# Patient Record
Sex: Female | Born: 1950 | Race: White | Hispanic: No | Smoking: Never smoker
Health system: Southern US, Community
[De-identification: ages and names within clinical notes are randomized; demographics above are authoritative.]

## PROBLEM LIST (undated history)

## (undated) DIAGNOSIS — Z87442 Personal history of urinary calculi: Secondary | ICD-10-CM

## (undated) DIAGNOSIS — K449 Diaphragmatic hernia without obstruction or gangrene: Secondary | ICD-10-CM

## (undated) DIAGNOSIS — F419 Anxiety disorder, unspecified: Secondary | ICD-10-CM

## (undated) DIAGNOSIS — G4733 Obstructive sleep apnea (adult) (pediatric): Secondary | ICD-10-CM

## (undated) DIAGNOSIS — K219 Gastro-esophageal reflux disease without esophagitis: Secondary | ICD-10-CM

## (undated) DIAGNOSIS — J449 Chronic obstructive pulmonary disease, unspecified: Secondary | ICD-10-CM

## (undated) DIAGNOSIS — M4800 Spinal stenosis, site unspecified: Secondary | ICD-10-CM

## (undated) DIAGNOSIS — M719 Bursopathy, unspecified: Secondary | ICD-10-CM

## (undated) DIAGNOSIS — J189 Pneumonia, unspecified organism: Secondary | ICD-10-CM

## (undated) DIAGNOSIS — I8390 Asymptomatic varicose veins of unspecified lower extremity: Secondary | ICD-10-CM

## (undated) DIAGNOSIS — K589 Irritable bowel syndrome without diarrhea: Secondary | ICD-10-CM

## (undated) DIAGNOSIS — M797 Fibromyalgia: Secondary | ICD-10-CM

## (undated) DIAGNOSIS — T7840XA Allergy, unspecified, initial encounter: Secondary | ICD-10-CM

## (undated) HISTORY — DX: Gastro-esophageal reflux disease without esophagitis: K21.9

## (undated) HISTORY — PX: CHOLECYSTECTOMY: SHX55

## (undated) HISTORY — DX: Obstructive sleep apnea (adult) (pediatric): G47.33

## (undated) HISTORY — DX: Asymptomatic varicose veins of unspecified lower extremity: I83.90

## (undated) HISTORY — DX: Bursopathy, unspecified: M71.9

## (undated) HISTORY — DX: Fibromyalgia: M79.7

## (undated) HISTORY — PX: TUBAL LIGATION: SHX77

## (undated) HISTORY — DX: Allergy, unspecified, initial encounter: T78.40XA

## (undated) HISTORY — DX: Diaphragmatic hernia without obstruction or gangrene: K44.9

## (undated) HISTORY — PX: PARTIAL HYSTERECTOMY: SHX80

## (undated) HISTORY — PX: BACK SURGERY: SHX140

## (undated) HISTORY — PX: OTHER SURGICAL HISTORY: SHX169

## (undated) HISTORY — DX: Irritable bowel syndrome, unspecified: K58.9

## (undated) HISTORY — DX: Morbid (severe) obesity due to excess calories: E66.01

## (undated) HISTORY — DX: Spinal stenosis, site unspecified: M48.00

---

## 1991-08-31 HISTORY — PX: ABDOMINAL HYSTERECTOMY: SHX81

## 1993-08-30 HISTORY — PX: APPENDECTOMY: SHX54

## 1998-08-30 HISTORY — PX: BREAST BIOPSY: SHX20

## 2004-06-02 ENCOUNTER — Ambulatory Visit: Payer: Self-pay | Admitting: General Surgery

## 2004-06-30 ENCOUNTER — Ambulatory Visit: Payer: Self-pay | Admitting: Unknown Physician Specialty

## 2004-07-16 ENCOUNTER — Ambulatory Visit: Payer: Self-pay | Admitting: General Surgery

## 2005-02-24 ENCOUNTER — Encounter: Admission: RE | Admit: 2005-02-24 | Discharge: 2005-02-24 | Payer: Self-pay | Admitting: Neurological Surgery

## 2005-02-26 ENCOUNTER — Ambulatory Visit (HOSPITAL_COMMUNITY): Admission: RE | Admit: 2005-02-26 | Discharge: 2005-02-27 | Payer: Self-pay | Admitting: Neurological Surgery

## 2005-05-14 ENCOUNTER — Ambulatory Visit: Payer: Self-pay | Admitting: Certified Nurse Midwife

## 2005-12-30 ENCOUNTER — Ambulatory Visit (HOSPITAL_COMMUNITY): Admission: RE | Admit: 2005-12-30 | Discharge: 2005-12-30 | Payer: Self-pay | Admitting: *Deleted

## 2006-08-30 HISTORY — PX: COLONOSCOPY: SHX174

## 2006-08-30 HISTORY — PX: UPPER GI ENDOSCOPY: SHX6162

## 2007-03-02 ENCOUNTER — Ambulatory Visit: Payer: Self-pay

## 2007-03-31 ENCOUNTER — Ambulatory Visit: Payer: Self-pay | Admitting: General Surgery

## 2008-04-30 ENCOUNTER — Ambulatory Visit: Payer: Self-pay

## 2008-08-29 ENCOUNTER — Ambulatory Visit: Payer: Self-pay | Admitting: Internal Medicine

## 2008-08-30 HISTORY — PX: OTHER SURGICAL HISTORY: SHX169

## 2009-11-14 ENCOUNTER — Ambulatory Visit: Payer: Self-pay | Admitting: Urology

## 2009-12-05 ENCOUNTER — Inpatient Hospital Stay: Payer: Self-pay | Admitting: Internal Medicine

## 2010-02-18 ENCOUNTER — Ambulatory Visit: Payer: Self-pay | Admitting: Internal Medicine

## 2010-03-17 ENCOUNTER — Encounter: Payer: Self-pay | Admitting: Internal Medicine

## 2010-03-30 ENCOUNTER — Encounter: Payer: Self-pay | Admitting: Internal Medicine

## 2010-05-12 ENCOUNTER — Ambulatory Visit: Payer: Self-pay | Admitting: Cardiology

## 2010-06-16 ENCOUNTER — Ambulatory Visit: Payer: Self-pay

## 2010-10-16 ENCOUNTER — Ambulatory Visit: Payer: Self-pay | Admitting: General Surgery

## 2010-10-19 LAB — PATHOLOGY REPORT

## 2011-01-15 NOTE — Op Note (Signed)
[name], Darlene Hubbard Hubbard                    ACCOUNT NO.:  [id number]   MEDICAL RECORD NO.:  [id number]          PATIENT TYPE:  OIB   LOCATION:  2856                         FACILITY:  [facility]   PHYSICIAN:  Tia Alert, MD     DATE OF BIRTH:  1950-10-30   DATE OF PROCEDURE:  02/26/2005  DATE OF DISCHARGE:                                 OPERATIVE REPORT   PREOPERATIVE DIAGNOSIS:  Lumbar spinal stenosis, L4-5, with left leg pain.   POSTOPERATIVE DIAGNOSIS:  Lumbar spinal stenosis, L4-5, with left leg pain.   PROCEDURES:  1.  Decompressive lumbar hemilaminectomy, medial facetectomy and      foraminotomy, L4-5 on the left.  2.  Sublaminar decompression of L4-5 for right lateral recess decompression.  3.  Microdiskectomy of L4-5 on the left utilizing microscopic dissection.   SURGEON:  Tia Alert, M.D.   ASSISTANT:  None.   ANESTHESIA:  General endotracheal.   COMPLICATIONS:  None apparent.   INDICATIONS FOR THE PROCEDURE:  Darlene Hubbard is a 60 year old white female who  was referred with back and left leg pain.  She had an MRI and then a CT scan  which showed significant spinal stenosis at L4-5 with a midline disk  herniation.  I recommended a lumbar decompressive hemilaminectomy with  sublaminar decompression from the left side because the patient's pain was  in her left leg.  She understood the risks, the benefits and expected  outcome and she wished to proceed with the procedure.   DESCRIPTION OF THE PROCEDURE:  The patient was taken to the operating room  and after induction of general endotracheal anesthesia, she was rolled into  the prone position on the Wilson frame and all pressure points were padded.  Her lumbar region was prepped with DuraPrep and then draped in the usual  sterile fashion.  Then 10 mL of local anesthesia were injected.  Then a  dorsal midline incision was made and carried down to the lumbosacral fascia.  The fascia was opened on the patient's left side and  the paraspinous  musculature was taken down in a subperiosteal fashion to expose the L4-5  interspace on the left.  Intraoperative x-ray confirmed our level.  Then a  hemilaminectomy, medial facetectomy and foraminotomy were performed at L4-5  on the left utilizing a combination of the drill and the Kerrison punches.  The yellow ligament was identified, opened and removed to expose the  underlying dura and L5 nerve root.  A foraminotomy was performed and his  lateral recess was decompressed.  I then used the drill to drill up under  the spinous process at L4 and then used the Kerrison punch to remove the  yellow ligament from the right lateral recess.  I could identify the lateral  edge of the dura on the patient's right side also.  Once the midline  decompression was complete, I retracted the nerve gently medially and  coagulated the upper nerve venous vasculature and then cut into the disk  space after finding a significant subannular bulge.  A thorough intradiskal  diskectomy was then performed with pituitary rongeurs and curets.  Once the  decompression was complete, I used a nerve hook to palpate into the foramen  and into the midline to ensure adequate decompression.  I then lined the  dura with Gelfoam and dried all bleeding points with bipolar cautery. I  closed the fascia with interrupted 1-0 Vicryl, closed the subcutaneous and  subcuticular tissues with 2-0 and 3-0 Vicryl and  closed the skin with Dermabond.  The drapes were removed.  A sterile  dressing was applied.  The patient was awaken from general anesthesia and  transferred to the recovery room in stable condition.  At the end of the  procedure, all sponge, needle and instrument counts were correct.       DSJ/MEDQ  D:  02/26/2005  T:  02/26/2005  Job:  [id number]

## 2011-01-15 NOTE — Cardiovascular Report (Signed)
NAMEThurza, Darlene Hubbard                    ACCOUNT NO.:  [id number]   MEDICAL RECORD NO.:  [id number]          PATIENT TYPE:  OIB   LOCATION:  2899                         FACILITY:  [facility]   PHYSICIAN:  Elmore Guise., M.D.DATE OF BIRTH:  1951/05/13   DATE OF PROCEDURE:  12/30/2005  DATE OF DISCHARGE:                              CARDIAC CATHETERIZATION   INDICATION FOR PROCEDURE:  Progressive fatigue, dyspnea, chest pain, and  abnormal stress test.   DESCRIPTION OF PROCEDURE:  Patient brought to the cardiac catheterization  laboratory after appropriate informed consent.  She was prepped and draped  in the sterile fashion.  Approximately 20 mL of 1% lidocaine was used for  local anesthesia.  A 6-French sheath was placed in the right femoral artery  without difficulty.  Coronary angiography, LV angiography, limited right  femoral angiography were then performed.  AngioSeal closure device was  deployed without complication.   FINDINGS:  1.  Left main:  Normal.  2.  LAD:  Normal-appearing moderate to large vessel number 3.  D1:  Moderate      sized vessel, normal-appearing.  3.  LCX:  Non-dominant, normal-appearing.  4.  OM1:  Normal-appearing, moderate sized vessel.  5.  RCA:  Dominant, normal-appearing large vessel with PDA and PLV extending      near the apex.  6.  LV:  EF is 65%.  No wall motion abnormalities.  LVEDP is 18 mmHg.  7.  Limited right femoral angiogram shows no significant atherosclerotic      disease and appropriate arteriotomy site for AngioSeal closure device      deployment.  AngioSeal closure device was deployed without difficulty.   IMPRESSION:  1.  Normal-appearing coronary arteries.  2.  Preserved left ventricular systolic function with an ejection fraction      of 65%.   PLAN:  Aggressive risk factor modification as indicated.      Elmore Guise., M.D.  Electronically Signed     TWK/MEDQ  D:  12/30/2005  T:  12/31/2005  Job:  [id number]   cc:    Wonda Cheng  Fax: [phone]

## 2011-06-03 ENCOUNTER — Inpatient Hospital Stay: Payer: Self-pay

## 2012-03-16 ENCOUNTER — Ambulatory Visit: Payer: Self-pay

## 2013-03-15 ENCOUNTER — Encounter: Payer: Self-pay | Admitting: *Deleted

## 2013-04-12 ENCOUNTER — Ambulatory Visit: Payer: Self-pay | Admitting: Internal Medicine

## 2013-04-26 ENCOUNTER — Emergency Department: Payer: Self-pay | Admitting: Emergency Medicine

## 2013-04-26 LAB — COMPREHENSIVE METABOLIC PANEL
Albumin: 3.1 g/dL — ABNORMAL LOW (ref 3.4–5.0)
Alkaline Phosphatase: 90 U/L (ref 50–136)
Anion Gap: 5 — ABNORMAL LOW (ref 7–16)
BUN: 18 mg/dL (ref 7–18)
Bilirubin,Total: 0.5 mg/dL (ref 0.2–1.0)
Calcium, Total: 8.8 mg/dL (ref 8.5–10.1)
Chloride: 104 mmol/L (ref 98–107)
Co2: 30 mmol/L (ref 21–32)
Creatinine: 0.85 mg/dL (ref 0.60–1.30)
EGFR (African American): >60
EGFR (Non-African Amer.): >60
Glucose: 97 mg/dL (ref 65–99)
Osmolality: 279 (ref 275–301)
Potassium: 3.4 mmol/L — ABNORMAL LOW (ref 3.5–5.1)
SGOT(AST): 19 U/L (ref 15–37)
SGPT (ALT): 23 U/L (ref 12–78)
Sodium: 139 mmol/L (ref 136–145)
Total Protein: 6.3 g/dL — ABNORMAL LOW (ref 6.4–8.2)

## 2013-04-26 LAB — CBC
HCT: 38.3 % (ref 35.0–47.0)
HGB: 13.1 g/dL (ref 12.0–16.0)
MCH: 29.4 pg (ref 26.0–34.0)
MCHC: 34.1 g/dL (ref 32.0–36.0)
MCV: 86 fL (ref 80–100)
Platelet: 241 10*3/uL (ref 150–440)
RBC: 4.45 10*6/uL (ref 3.80–5.20)
RDW: 14.4 % (ref 11.5–14.5)
WBC: 7.2 10*3/uL (ref 3.6–11.0)

## 2013-04-26 LAB — URINALYSIS, COMPLETE
Bacteria: NONE SEEN
Bilirubin,UR: NEGATIVE
Blood: NEGATIVE
Glucose,UR: NEGATIVE mg/dL (ref 0–75)
Ketone: NEGATIVE
Leukocyte Esterase: NEGATIVE
Nitrite: NEGATIVE
Ph: 6 (ref 4.5–8.0)
Protein: NEGATIVE
RBC,UR: <1 /HPF (ref 0–5)
Specific Gravity: 1.005 (ref 1.003–1.030)
Squamous Epithelial: <1
WBC UR: <1 /HPF (ref 0–5)

## 2013-04-27 ENCOUNTER — Ambulatory Visit: Payer: Self-pay | Admitting: Internal Medicine

## 2013-12-22 DIAGNOSIS — K219 Gastro-esophageal reflux disease without esophagitis: Secondary | ICD-10-CM | POA: Insufficient documentation

## 2014-05-16 ENCOUNTER — Ambulatory Visit: Payer: Self-pay

## 2014-07-01 ENCOUNTER — Encounter: Payer: Self-pay | Admitting: *Deleted

## 2014-09-04 ENCOUNTER — Emergency Department: Payer: Self-pay | Admitting: Student

## 2014-11-20 ENCOUNTER — Ambulatory Visit: Payer: Self-pay | Admitting: Internal Medicine

## 2015-05-07 ENCOUNTER — Other Ambulatory Visit: Payer: Self-pay | Admitting: Internal Medicine

## 2015-05-07 DIAGNOSIS — Z1231 Encounter for screening mammogram for malignant neoplasm of breast: Secondary | ICD-10-CM

## 2015-05-19 ENCOUNTER — Ambulatory Visit: Payer: Self-pay

## 2015-05-21 ENCOUNTER — Ambulatory Visit
Admission: RE | Admit: 2015-05-21 | Discharge: 2015-05-21 | Disposition: A | Discharge status: Home / Self Care | Payer: BC Managed Care – PPO | Source: Ambulatory Visit | Attending: Internal Medicine | Admitting: Internal Medicine

## 2015-05-21 DIAGNOSIS — Z1231 Encounter for screening mammogram for malignant neoplasm of breast: Secondary | ICD-10-CM | POA: Diagnosis present

## 2015-08-06 DIAGNOSIS — L570 Actinic keratosis: Secondary | ICD-10-CM

## 2015-08-06 HISTORY — DX: Actinic keratosis: L57.0

## 2015-08-13 ENCOUNTER — Encounter: Payer: Self-pay | Admitting: *Deleted

## 2015-08-28 DIAGNOSIS — C4491 Basal cell carcinoma of skin, unspecified: Secondary | ICD-10-CM

## 2015-08-28 HISTORY — DX: Basal cell carcinoma of skin, unspecified: C44.91

## 2015-10-20 ENCOUNTER — Ambulatory Visit: Payer: Self-pay | Admitting: General Surgery

## 2015-11-26 DIAGNOSIS — J4540 Moderate persistent asthma, uncomplicated: Secondary | ICD-10-CM | POA: Insufficient documentation

## 2015-12-10 ENCOUNTER — Encounter: Payer: Self-pay | Admitting: General Surgery

## 2015-12-11 ENCOUNTER — Ambulatory Visit: Payer: BC Managed Care – PPO | Admitting: General Surgery

## 2015-12-11 ENCOUNTER — Encounter: Payer: Self-pay | Admitting: General Surgery

## 2015-12-11 ENCOUNTER — Ambulatory Visit (INDEPENDENT_AMBULATORY_CARE_PROVIDER_SITE_OTHER): Payer: BC Managed Care – PPO | Admitting: General Surgery

## 2015-12-11 VITALS — BP 110/62 | HR 78 | Resp 12 | Ht 63.0 in | Wt 179.0 lb

## 2015-12-11 DIAGNOSIS — Z800 Family history of malignant neoplasm of digestive organs: Secondary | ICD-10-CM

## 2015-12-11 MED ORDER — POLYETHYLENE GLYCOL 3350 17 GM/SCOOP PO POWD: ORAL | Status: DC

## 2015-12-11 NOTE — Patient Instructions (Addendum)
Colonoscopy A colonoscopy is an exam to look at the entire large intestine (colon). This exam can help find problems such as tumors, polyps, inflammation, and areas of bleeding. The exam takes about 1 hour.  LET [facility] CARE PROVIDER KNOW ABOUT:   Any allergies you have.  All medicines you are taking, including vitamins, herbs, eye drops, creams, and over-the-counter medicines.  Previous problems you or members of your family have had with the use of anesthetics.  Any blood disorders you have.  Previous surgeries you have had.  Medical conditions you have. RISKS AND COMPLICATIONS  Generally, this is a safe procedure. However, as with any procedure, complications can occur. Possible complications include:  Bleeding.  Tearing or rupture of the colon wall.  Reaction to medicines given during the exam.  Infection (rare). BEFORE THE PROCEDURE   Ask your health care provider about changing or stopping your regular medicines.  You may be prescribed an oral bowel prep. This involves drinking a large amount of medicated liquid, starting the day before your procedure. The liquid will cause you to have multiple loose stools until your stool is almost clear or light green. This cleans out your colon in preparation for the procedure.  Do not eat or drink anything else once you have started the bowel prep, unless your health care provider tells you it is safe to do so.  Arrange for someone to drive you home after the procedure. PROCEDURE   You will be given medicine to help you relax (sedative).  You will lie on your side with your knees bent.  A long, flexible tube with a light and camera on the end (colonoscope) will be inserted through the rectum and into the colon. The camera sends video back to a computer screen as it moves through the colon. The colonoscope also releases carbon dioxide gas to inflate the colon. This helps your health care provider see the area better.  During  the exam, your health care provider may take a small tissue sample (biopsy) to be examined under a microscope if any abnormalities are found.  The exam is finished when the entire colon has been viewed. AFTER THE PROCEDURE   Do not drive for 24 hours after the exam.  You may have a small amount of blood in your stool.  You may pass moderate amounts of gas and have mild abdominal cramping or bloating. This is caused by the gas used to inflate your colon during the exam.  Ask when your test results will be ready and how you will get your results. Make sure you get your test results.   This information is not intended to replace advice given to you by your health care provider. Make sure you discuss any questions you have with your health care provider.   Document Released: 08/13/2000 Document Revised: 06/06/2013 Document Reviewed: 04/23/2013 Elsevier Interactive Patient Education [employer].  Patient has been scheduled for a colonoscopy on 02-18-16 at [facility].

## 2015-12-11 NOTE — Progress Notes (Signed)
Patient ID: Darlene Hubbard, female   DOB: 1951/05/15, 65 y.o.   MRN: [id number]  Chief Complaint  Patient presents with  . Colonoscopy    HPI Darlene Hubbard is a 65 y.o. female here today for a evaluation of a colonoscopy. Last colonoscopy 10/16/10. Patient states no GI problems at this time. Father had colon cancer.  I personally reviewed the patient's history. HPI  Past Medical History  Diagnosis Date  . Asthma   . Fibromyalgia   . Hiatal hernia   . GERD (gastroesophageal reflux disease)   . Varicose veins   . Mild obstructive sleep apnea   . Spinal stenosis   . Morbid obesity ([place])   . IBS (irritable bowel syndrome)   . HTN (hypertension)   . Allergy   . Bursitis     BOTH SHOULDER AND HIPS    Past Surgical History  Procedure Laterality Date  . Back surgery    . Cholecystectomy    . Tubal ligation    . Partial hysterectomy    . Appendectomy  1995  . Colonoscopy  2008  . Upper gi endoscopy  2008  . Abdominal hysterectomy  1993  . Vein closure procedure Bilateral 2010  . Breast biopsy Left 2000    neg    Family History  Problem Relation Age of Onset  . Colon cancer Father 12  . Thrombocytopenia Mother 6  . Other Sister     Bone Marrow disorder    Social History Social History  Substance Use Topics  . Smoking status: Never Smoker   . Smokeless tobacco: Never Used  . Alcohol Use: No    Allergies  Allergen Reactions  . Albuterol     Respiratory Distress  . Molds & Smuts   . Penicillins   . Diovan [Valsartan] Rash    Edema  . Sulfa Drugs Cross Reactors Itching and Rash    Current Outpatient Prescriptions  Medication Sig Dispense Refill  . ALPRAZolam (XANAX) 0.5 MG tablet     . cetirizine (ZYRTEC) 10 MG tablet Take by mouth.    . Fluticasone-Salmeterol (ADVAIR) 100-50 MCG/DOSE AEPB Inhale 1 puff into the lungs 2 (two) times daily.    Marland Kitchen gabapentin (NEURONTIN) 300 MG capsule Take 300 mg by mouth 2 (two) times daily.     . Multiple Vitamin  (MULTI-VITAMINS) TABS Take by mouth.    . sertraline (ZOLOFT) 50 MG tablet Take by mouth.    . polyethylene glycol powder (GLYCOLAX/MIRALAX) powder 255 grams one bottle for colonoscopy prep 255 g 0   No current facility-administered medications for this visit.    Review of Systems Review of Systems  Constitutional: Negative.   Respiratory: Negative.   Cardiovascular: Negative.     Blood pressure 110/62, pulse 78, resp. rate 12, height 5\' 3"  (1.6 m), weight 179 lb (81.194 kg).  Physical Exam Physical Exam  Constitutional: She is oriented to person, place, and time. She appears well-developed and well-nourished.  Eyes: Conjunctivae are normal. No scleral icterus.  Neck: Neck supple.  Cardiovascular: Normal rate, regular rhythm and normal heart sounds.   Pulmonary/Chest: Effort normal and breath sounds normal.  Abdominal: Soft. Normal appearance and bowel sounds are normal. There is no hepatomegaly. There is no tenderness.     Lymphadenopathy:    She has no cervical adenopathy.  Neurological: She is alert and oriented to person, place, and time.  Skin: Skin is warm and dry.    Data Reviewed Colonoscopy dated 10/16/2010 was unremarkable. Five-year  follow-up indicated based on family history.  Assessment    Candidate for follow-up colonoscopy.    Plan       Colonoscopy with possible biopsy/polypectomy prn: Information regarding the procedure, including its potential risks and complications (including but not limited to perforation of the bowel, which may require emergency surgery to repair, and bleeding) was verbally given to the patient. Educational information regarding lower intestinal endoscopy was given to the patient. Written instructions for how to complete the bowel prep using Miralax were provided. The importance of drinking ample fluids to avoid dehydration as a result of the prep emphasized.  Patient has been scheduled for a colonoscopy on 02-18-16 at [facility].  PCP:   Emily Filbert F  This information has been scribed by Darlene Hubbard CMA.    Darlene Hubbard 12/12/2015, 6:30 PM

## 2015-12-12 DIAGNOSIS — Z800 Family history of malignant neoplasm of digestive organs: Secondary | ICD-10-CM | POA: Insufficient documentation

## 2016-01-18 ENCOUNTER — Emergency Department
Admission: EM | Admit: 2016-01-18 | Discharge: 2016-01-18 | Disposition: A | Discharge status: Home / Self Care | Payer: BC Managed Care – PPO | Attending: Emergency Medicine | Admitting: Emergency Medicine

## 2016-01-18 ENCOUNTER — Encounter: Payer: Self-pay | Admitting: Emergency Medicine

## 2016-01-18 DIAGNOSIS — Y9389 Activity, other specified: Secondary | ICD-10-CM | POA: Insufficient documentation

## 2016-01-18 DIAGNOSIS — I10 Essential (primary) hypertension: Secondary | ICD-10-CM | POA: Insufficient documentation

## 2016-01-18 DIAGNOSIS — J45909 Unspecified asthma, uncomplicated: Secondary | ICD-10-CM | POA: Insufficient documentation

## 2016-01-18 DIAGNOSIS — S81812A Laceration without foreign body, left lower leg, initial encounter: Secondary | ICD-10-CM | POA: Diagnosis not present

## 2016-01-18 DIAGNOSIS — Y929 Unspecified place or not applicable: Secondary | ICD-10-CM | POA: Insufficient documentation

## 2016-01-18 DIAGNOSIS — Y999 Unspecified external cause status: Secondary | ICD-10-CM | POA: Insufficient documentation

## 2016-01-18 DIAGNOSIS — W1849XA Other slipping, tripping and stumbling without falling, initial encounter: Secondary | ICD-10-CM | POA: Insufficient documentation

## 2016-01-18 MED ORDER — LIDOCAINE HCL (PF) 1 % IJ SOLN
5.0000 mL | Freq: Once | INTRAMUSCULAR | Status: AC
  Administered 2016-01-18: 5 mL
  Filled 2016-01-18: qty 5

## 2016-01-18 NOTE — ED Provider Notes (Signed)
[facility] Emergency Department Provider Note ____________________________________________  Time seen: 1205  I have reviewed the triage vital signs and the nursing notes.  HISTORY  Chief Complaint  Laceration  HPI Darlene Hubbard is a 65 y.o. female presents to the ED for evaluation of a large skin tear laceration to the anterior left shin. She describes accident occurred as she stepped over herdog gait and slipped. She had a similar skin tear to the other extremity about a year prior. She has a dampened gauze in place upon arrival. She denies any other injury at this time. She reports minimal pain at a 2/10 in triage.  Past Medical History  Diagnosis Date  . Asthma   . Fibromyalgia   . Hiatal hernia   . GERD (gastroesophageal reflux disease)   . Varicose veins   . Mild obstructive sleep apnea   . Spinal stenosis   . Morbid obesity ([place])   . IBS (irritable bowel syndrome)   . HTN (hypertension)   . Allergy   . Bursitis     BOTH SHOULDER AND HIPS    Patient Active Problem List   Diagnosis Date Noted  . Family history of colon cancer 12/12/2015    Past Surgical History  Procedure Laterality Date  . Back surgery    . Cholecystectomy    . Tubal ligation    . Partial hysterectomy    . Appendectomy  1995  . Colonoscopy  2008  . Upper gi endoscopy  2008  . Abdominal hysterectomy  1993  . Vein closure procedure Bilateral 2010  . Breast biopsy Left 2000    neg    Current Outpatient Rx  Name  Route  Sig  Dispense  Refill  . ALPRAZolam (XANAX) 0.5 MG tablet               . cetirizine (ZYRTEC) 10 MG tablet   Oral   Take by mouth.         . Fluticasone-Salmeterol (ADVAIR) 100-50 MCG/DOSE AEPB   Inhalation   Inhale 1 puff into the lungs 2 (two) times daily.         Marland Kitchen gabapentin (NEURONTIN) 300 MG capsule   Oral   Take 300 mg by mouth 2 (two) times daily.          . Multiple Vitamin (MULTI-VITAMINS) TABS   Oral   Take by  mouth.         . polyethylene glycol powder (GLYCOLAX/MIRALAX) powder      255 grams one bottle for colonoscopy prep   255 g   0   . sertraline (ZOLOFT) 50 MG tablet   Oral   Take by mouth.          Allergies Albuterol; Molds & smuts; Penicillins; Diovan; and Sulfa drugs cross reactors  Family History  Problem Relation Age of Onset  . Colon cancer Father 80  . Thrombocytopenia Mother 45  . Other Sister     Bone Marrow disorder    Social History Social History  Substance Use Topics  . Smoking status: Never Smoker   . Smokeless tobacco: Never Used  . Alcohol Use: No   Review of Systems  Constitutional: Negative for fever. Musculoskeletal: Negative for back pain. Skin: Negative for rash. LLE skin tear as above Neurological: Negative for headaches, focal weakness or numbness. ____________________________________________  PHYSICAL EXAM:  VITAL SIGNS: ED Triage Vitals  Enc Vitals Group     BP 01/18/16 1123 149/89 mmHg  Pulse Rate 01/18/16 1123 83     Resp 01/18/16 1123 18     Temp 01/18/16 1123 98.3 F (36.8 C)     Temp Source 01/18/16 1123 Oral     SpO2 01/18/16 1123 99 %     Weight 01/18/16 1123 169 lb (76.658 kg)     Height 01/18/16 1123 5\' 3"  (1.6 m)     Head Cir --      Peak Flow --      Pain Score 01/18/16 1124 2     Pain Loc --      Pain Edu? --      Excl. in [place]? --    Constitutional: Alert and oriented. Well appearing and in no distress. Head: Normocephalic and atraumatic. Cardiovascular: Normal distal pulses and cap refill.   Respiratory: Normal respiratory effort. No wheezes/rales/rhonchi. Gastrointestinal: Soft and nontender. No distention. Musculoskeletal: Nontender with normal range of motion in all extremities.  Neurologic:  Normal gait without ataxia. Normal speech and language. No gross focal neurologic deficits are appreciated. Skin:  Skin is warm, dry and intact. No rash noted. Left shin with an irregular flap skin tear. No active  bleeding.  ____________________________________________  LACERATION REPAIR Performed by: Melvenia Needles Authorized by: Melvenia Needles Consent: Verbal consent obtained. Risks and benefits: risks, benefits and alternatives were discussed Consent given by: patient Patient identity confirmed: provided demographic data Prepped and Draped in normal sterile fashion Wound explored  Laceration Location: Left shin  Laceration Length: 5.6 x 5.0 cm  No Foreign Bodies seen or palpated  Anesthesia: local infiltration  Local anesthetic: lidocaine 1% w/o epinephrine  Anesthetic total: 5 ml  Irrigation method: syringe Amount of cleaning: standard  Skin closure: 4-0 nylon  Number of sutures: 15  Technique: interrupted through steri-strips along wound edges  Patient tolerance: Patient tolerated the procedure well with no immediate complications. ____________________________________________  INITIAL IMPRESSION / ASSESSMENT AND PLAN / ED COURSE  Patient with a large skin tear laceration to the left lower extremity status post suture repair. Steri-Strips were applied along the wound edges to help reduce further skin injury with suture placement. The patient tolerated the procedure well and good wound approximation is achieved. She is discharged with a work note to remain out of work for 2-3 days. She should rest with the leg elevated and apply cool compresses as needed. He will follow with her primary  Care provider in 10-14 days for suture removal. ____________________________________________  FINAL CLINICAL IMPRESSION(S) / ED DIAGNOSES  Final diagnoses:  Skin tear of lower leg without complication, left, initial encounter  Leg laceration, left, initial encounter     Melvenia Needles, PA-C 01/20/16 0133  Lavonia Drafts, MD 01/20/16 [phone]

## 2016-01-18 NOTE — Discharge Instructions (Signed)
Skin Tear Care A skin tear is a wound in which the top layer of skin has peeled off. This is a common problem with aging because the skin becomes thinner and more fragile as a person gets older. In addition, some medicines, such as oral corticosteroids, can lead to skin thinning if taken for long periods of time.  A skin tear is often repaired with tape or skin adhesive strips. This keeps the skin that has been peeled off in contact with the healthier skin beneath. Depending on the location of the wound, a bandage (dressing) may be applied over the tape or skin adhesive strips. Sometimes, during the healing process, the skin turns black and dies. Even when this happens, the torn skin acts as a good dressing until the skin underneath gets healthier and repairs itself. HOME CARE INSTRUCTIONS   Change dressings once per day or as directed by your caregiver.  Gently clean the skin tear and the area around the tear using saline solution or mild soap and water.  Do not rub the injured skin dry. Let the area air dry.  Apply petroleum jelly or an antibiotic cream or ointment to keep the tear moist. This will help the wound heal. Do not allow a scab to form.  If the dressing sticks before the next dressing change, moisten it with warm soapy water and gently remove it.  Protect the injured skin until it has healed.  Only take over-the-counter or prescription medicines as directed by your caregiver.  Take showers or baths using warm soapy water. Apply a new dressing after the shower or bath.  Keep all follow-up appointments as directed by your caregiver.  SEEK IMMEDIATE MEDICAL CARE IF:   You have redness, swelling, or increasing pain in the skin tear.  You havepus coming from the skin tear.  You have chills.  You have a red streak that goes away from the skin tear.  You have a bad smell coming from the tear or dressing.  You have a fever or persistent symptoms for more than 2-3 days.  You  have a fever and your symptoms suddenly get worse. MAKE SURE YOU:  Understand these instructions.  Will watch this condition.  Will get help right away if your child is not doing well or gets worse.   This information is not intended to replace advice given to you by your health care provider. Make sure you discuss any questions you have with your health care provider.   Document Released: 05/11/2001 Document Revised: 05/10/2012 Document Reviewed: 02/28/2012 Elsevier Interactive Patient Education 2016 [place] the wound appropriately dry. Cover and cleanse with saline as needed. Apply triple antibiotic ointment, sparingly to the wound. Follow-up with your provider in 2 weeks for suture removal. Rest with the leg elevated when seated. Return to the ED as needed.

## 2016-01-18 NOTE — ED Notes (Signed)
Stepped over baby gait and injured left lower leg.

## 2016-02-11 ENCOUNTER — Telehealth: Payer: Self-pay | Admitting: *Deleted

## 2016-02-11 NOTE — Telephone Encounter (Signed)
Message left for patient to call the office.   Patient is currently scheduled for a colonoscopy on 02-18-16 at [facility].   We need to make sure patient has not had a change in medications since her last office visit. Also, need to make sure she has Miralax prescription and does not have any further questions about prep at this time.

## 2016-02-11 NOTE — Telephone Encounter (Signed)
Patient called the office back and medications were reviewed with the patient. She states everything is the same with the addition of Vitamin D3 2000 IU's but she does not take this every day. Medication list updated.  This patient also states that she has Miralax prescription.   We will proceed with colonoscopy as scheduled.   Patient instructed to call the office should she have further questions.

## 2016-02-18 ENCOUNTER — Ambulatory Visit: Payer: BC Managed Care – PPO | Admitting: Anesthesiology

## 2016-02-18 ENCOUNTER — Encounter: Payer: Self-pay | Admitting: *Deleted

## 2016-02-18 ENCOUNTER — Encounter
Admission: RE | Disposition: A | Discharge status: Home / Self Care | Payer: Self-pay | Source: Ambulatory Visit | Attending: General Surgery

## 2016-02-18 ENCOUNTER — Ambulatory Visit
Admission: RE | Admit: 2016-02-18 | Discharge: 2016-02-18 | Disposition: A | Discharge status: Home / Self Care | Payer: BC Managed Care – PPO | Source: Ambulatory Visit | Attending: General Surgery | Admitting: General Surgery

## 2016-02-18 DIAGNOSIS — G4733 Obstructive sleep apnea (adult) (pediatric): Secondary | ICD-10-CM | POA: Insufficient documentation

## 2016-02-18 DIAGNOSIS — Z6829 Body mass index (BMI) 29.0-29.9, adult: Secondary | ICD-10-CM | POA: Diagnosis not present

## 2016-02-18 DIAGNOSIS — K219 Gastro-esophageal reflux disease without esophagitis: Secondary | ICD-10-CM | POA: Insufficient documentation

## 2016-02-18 DIAGNOSIS — M797 Fibromyalgia: Secondary | ICD-10-CM | POA: Insufficient documentation

## 2016-02-18 DIAGNOSIS — Z1211 Encounter for screening for malignant neoplasm of colon: Secondary | ICD-10-CM | POA: Diagnosis not present

## 2016-02-18 DIAGNOSIS — Z7951 Long term (current) use of inhaled steroids: Secondary | ICD-10-CM | POA: Diagnosis not present

## 2016-02-18 DIAGNOSIS — Z800 Family history of malignant neoplasm of digestive organs: Secondary | ICD-10-CM | POA: Insufficient documentation

## 2016-02-18 DIAGNOSIS — J45909 Unspecified asthma, uncomplicated: Secondary | ICD-10-CM | POA: Insufficient documentation

## 2016-02-18 HISTORY — PX: COLONOSCOPY WITH PROPOFOL: SHX5780

## 2016-02-18 SURGERY — COLONOSCOPY WITH PROPOFOL
Anesthesia: General

## 2016-02-18 MED ORDER — LIDOCAINE HCL (CARDIAC) 20 MG/ML IV SOLN
INTRAVENOUS | Status: DC | PRN
  Administered 2016-02-18: 30 mg via INTRAVENOUS

## 2016-02-18 MED ORDER — SODIUM CHLORIDE 0.9 % IV SOLN
INTRAVENOUS | Status: DC
  Administered 2016-02-18: 09:00:00 via INTRAVENOUS

## 2016-02-18 MED ORDER — PROPOFOL 500 MG/50ML IV EMUL
INTRAVENOUS | Status: DC | PRN
  Administered 2016-02-18: 150 ug/kg/min via INTRAVENOUS

## 2016-02-18 MED ORDER — PROPOFOL 10 MG/ML IV BOLUS
INTRAVENOUS | Status: DC | PRN
  Administered 2016-02-18: 70 mg via INTRAVENOUS

## 2016-02-18 NOTE — Op Note (Addendum)
[facility] Gastroenterology Patient Name: Darlene Hubbard Procedure Date: 02/18/2016 9:02 AM MRN: [id number] Account #: [id number] Date of Birth: 07-18-1951 Admit Type: Outpatient Age: 65 Room: [facility] ENDO ROOM 1 Gender: Female Note Status: Finalized Procedure:            Colonoscopy Indications:          Family history of colon cancer Providers:            [name], MD Referring MD:         Rusty Aus, MD (Referring MD) Medicines:            Monitored Anesthesia Care Complications:        No immediate complications. Procedure:            Pre-Anesthesia Assessment:                       - Prior to the procedure, a History and Physical was                        performed, and patient medications, allergies and                        sensitivities were reviewed. The patient's tolerance of                        previous anesthesia was reviewed.                       - The risks and benefits of the procedure and the                        sedation options and risks were discussed with the                        patient. All questions were answered and informed                        consent was obtained.                       After obtaining informed consent, the colonoscope was                        passed under direct vision. Throughout the procedure,                        the patient's blood pressure, pulse, and oxygen                        saturations were monitored continuously. The                        Colonoscope was introduced through the anus and                        advanced to the the cecum, identified by appendiceal                        orifice and ileocecal valve. The colonoscopy was  performed without difficulty. The quality of the bowel                        preparation was excellent. The colonoscopy was                        performed without difficulty. The patient tolerated the   procedure well. Findings:      The entire examined colon appeared normal on direct and retroflexion       views. Impression:           - The entire examined colon is normal on direct and                        retroflexion views.                       - No specimens collected. Recommendation:       - Repeat colonoscopy in 5 years for surveillance. Procedure Code(s):    --- Professional ---                       [id number], Colonoscopy, flexible; diagnostic, including                        collection of specimen(s) by brushing or washing, when                        performed (separate procedure) Diagnosis Code(s):    --- Professional ---                       Z80.0, Family history of malignant neoplasm of                        digestive organs CPT copyright 2016 American Medical Association. All rights reserved. The codes documented in this report are preliminary and upon coder review may  be revised to meet current compliance requirements. [name], MD 02/18/2016 9:31:24 AM This report has been signed electronically. Number of Addenda: 0 Note Initiated On: 02/18/2016 9:02 AM Scope Withdrawal Time: 0 hours 5 minutes 56 seconds  Total Procedure Duration: 0 hours 12 minutes 52 seconds       [facility]

## 2016-02-18 NOTE — Anesthesia Preprocedure Evaluation (Signed)
Anesthesia Evaluation  Patient identified by MRN, date of birth, ID band Patient awake    Reviewed: Allergy & Precautions, H&P , NPO status , Patient's Chart, lab work & pertinent test results, reviewed documented beta blocker date and time   History of Anesthesia Complications Negative for: history of anesthetic complications  Airway Mallampati: I  TM Distance: >3 FB Neck ROM: full    Dental no notable dental hx. (+) Missing, Teeth Intact Permanent bridge:   Pulmonary neg shortness of breath, asthma , sleep apnea , neg COPD, neg recent URI,    Pulmonary exam normal breath sounds clear to auscultation       Cardiovascular Exercise Tolerance: Good negative cardio ROS Normal cardiovascular exam Rhythm:regular Rate:Normal     Neuro/Psych neg Seizures  Neuromuscular disease (fibromyalgia) negative psych ROS   GI/Hepatic Neg liver ROS, hiatal hernia, GERD  ,  Endo/Other  negative endocrine ROS  Renal/GU negative Renal ROS  negative genitourinary   Musculoskeletal   Abdominal   Peds  Hematology negative hematology ROS (+)   Anesthesia Other Findings Past Medical History:   Asthma                                                       Fibromyalgia                                                 Hiatal hernia                                                GERD (gastroesophageal reflux disease)                       Varicose veins                                               Mild obstructive sleep apnea                                 Spinal stenosis                                              Morbid obesity (HCC)                                         IBS (irritable bowel syndrome)                               Allergy  Bursitis                                                       Comment:BOTH SHOULDER AND HIPS   Reproductive/Obstetrics negative OB ROS                              Anesthesia Physical Anesthesia Plan  ASA: II  Anesthesia Plan: General   Post-op Pain Management:    Induction:   Airway Management Planned:   Additional Equipment:   Intra-op Plan:   Post-operative Plan:   Informed Consent: I have reviewed the patients History and Physical, chart, labs and discussed the procedure including the risks, benefits and alternatives for the proposed anesthesia with the patient or authorized representative who has indicated his/her understanding and acceptance.   Dental Advisory Given  Plan Discussed with: Anesthesiologist, CRNA and Surgeon  Anesthesia Plan Comments:         Anesthesia Quick Evaluation

## 2016-02-18 NOTE — Anesthesia Postprocedure Evaluation (Signed)
Anesthesia Post Note  Patient: Darlene Hubbard  Procedure(s) Performed: Procedure(s) (LRB): COLONOSCOPY WITH PROPOFOL (N/A)  Patient location during evaluation: Endoscopy Anesthesia Type: General Level of consciousness: awake and alert Pain management: pain level controlled Vital Signs Assessment: post-procedure vital signs reviewed and stable Respiratory status: spontaneous breathing, nonlabored ventilation, respiratory function stable and patient connected to nasal cannula oxygen Cardiovascular status: blood pressure returned to baseline and stable Postop Assessment: no signs of nausea or vomiting Anesthetic complications: no    Last Vitals:  Filed Vitals:   02/18/16 0950 02/18/16 1001  BP: 111/63 112/75  Pulse: 66 60  Temp:    Resp: 20 16    Last Pain: There were no vitals filed for this visit.               Martha Clan

## 2016-02-18 NOTE — H&P (Signed)
Darlene Hubbard [id number] [birth year]     HPI: Family history of colon cancer. For screening exam.   Prescriptions prior to admission  Medication Sig Dispense Refill Last Dose  . ALPRAZolam (XANAX) 0.5 MG tablet    02/17/2016 at Unknown time  . Cholecalciferol (VITAMIN D-3 PO) Take 2,000 Int'l Units by mouth.    Past Week at Unknown time  . gabapentin (NEURONTIN) 300 MG capsule Take 300 mg by mouth 2 (two) times daily.    02/17/2016 at Unknown time  . Multiple Vitamin (MULTI-VITAMINS) TABS Take by mouth.   Past Week at Unknown time  . sertraline (ZOLOFT) 50 MG tablet Take by mouth.   02/17/2016 at Unknown time  . cetirizine (ZYRTEC) 10 MG tablet Take by mouth.   Taking  . Fluticasone-Salmeterol (ADVAIR) 100-50 MCG/DOSE AEPB Inhale 1 puff into the lungs 2 (two) times daily.   Taking  . polyethylene glycol powder (GLYCOLAX/MIRALAX) powder 255 grams one bottle for colonoscopy prep 255 g 0    Allergies  Allergen Reactions  . Albuterol     Respiratory Distress  . Molds & Smuts   . Penicillins   . Diovan [Valsartan] Rash    Edema  . Sulfa Drugs Cross Reactors Itching and Rash   Past Medical History  Diagnosis Date  . Asthma   . Fibromyalgia   . Hiatal hernia   . GERD (gastroesophageal reflux disease)   . Varicose veins   . Mild obstructive sleep apnea   . Spinal stenosis   . Morbid obesity ([place])   . IBS (irritable bowel syndrome)   . Allergy   . Bursitis     BOTH SHOULDER AND HIPS   Past Surgical History  Procedure Laterality Date  . Back surgery    . Cholecystectomy    . Tubal ligation    . Partial hysterectomy    . Appendectomy  1995  . Colonoscopy  2008  . Upper gi endoscopy  2008  . Abdominal hysterectomy  1993  . Vein closure procedure Bilateral 2010  . Breast biopsy Left 2000    neg   Social History   Social History  . Marital Status: Widowed    Spouse Name: N/A  . Number of Children: N/A  . Years of Education: N/A   Occupational History  . Not on file.    Social History Main Topics  . Smoking status: Never Smoker   . Smokeless tobacco: Never Used  . Alcohol Use: No  . Drug Use: No  . Sexual Activity: Not on file   Other Topics Concern  . Not on file   Social History Narrative   Social History   Social History Narrative     ROS: Negative.     PE: HEENT: Negative. Lungs: Clear. Cardio: RR.  Assessment/Plan:  Proceed with planned endoscopy.  [name] 02/18/2016

## 2016-02-18 NOTE — Transfer of Care (Signed)
Immediate Anesthesia Transfer of Care Note  Patient: Darlene Hubbard  Procedure(s) Performed: Procedure(s): COLONOSCOPY WITH PROPOFOL (N/A)  Patient Location: Endoscopy Unit  Anesthesia Type:General  Level of Consciousness: awake, alert  and oriented  Airway & Oxygen Therapy: Patient Spontanous Breathing and Patient connected to nasal cannula oxygen  Post-op Assessment: Report given to RN and Post -op Vital signs reviewed and stable  Post vital signs: Reviewed and stable  Last Vitals:  Filed Vitals:   02/18/16 0839 02/18/16 0933  BP: 130/55 80/47  Pulse: 71 74  Temp: 36.1 C   Resp: 20 16    Last Pain: There were no vitals filed for this visit.       Complications: No apparent anesthesia complications

## 2016-02-19 ENCOUNTER — Encounter: Payer: Self-pay | Admitting: General Surgery

## 2016-05-12 ENCOUNTER — Encounter (INDEPENDENT_AMBULATORY_CARE_PROVIDER_SITE_OTHER): Payer: Self-pay

## 2016-05-31 ENCOUNTER — Emergency Department: Payer: Medicare Other

## 2016-05-31 ENCOUNTER — Emergency Department
Admission: EM | Admit: 2016-05-31 | Discharge: 2016-05-31 | Disposition: A | Discharge status: Home / Self Care | Payer: Medicare Other | Source: Home / Self Care | Attending: Emergency Medicine | Admitting: Emergency Medicine

## 2016-05-31 ENCOUNTER — Encounter: Payer: Self-pay | Admitting: Emergency Medicine

## 2016-05-31 DIAGNOSIS — Y999 Unspecified external cause status: Secondary | ICD-10-CM | POA: Insufficient documentation

## 2016-05-31 DIAGNOSIS — S82042A Displaced comminuted fracture of left patella, initial encounter for closed fracture: Secondary | ICD-10-CM | POA: Diagnosis not present

## 2016-05-31 DIAGNOSIS — S82002A Unspecified fracture of left patella, initial encounter for closed fracture: Secondary | ICD-10-CM

## 2016-05-31 DIAGNOSIS — W010XXA Fall on same level from slipping, tripping and stumbling without subsequent striking against object, initial encounter: Secondary | ICD-10-CM | POA: Insufficient documentation

## 2016-05-31 DIAGNOSIS — J45909 Unspecified asthma, uncomplicated: Secondary | ICD-10-CM

## 2016-05-31 DIAGNOSIS — Y92000 Kitchen of unspecified non-institutional (private) residence as  the place of occurrence of the external cause: Secondary | ICD-10-CM | POA: Insufficient documentation

## 2016-05-31 DIAGNOSIS — Y939 Activity, unspecified: Secondary | ICD-10-CM | POA: Insufficient documentation

## 2016-05-31 MED ORDER — OXYCODONE-ACETAMINOPHEN 7.5-325 MG PO TABS: 1.0000 | ORAL_TABLET | ORAL | 0 refills | Status: DC | PRN

## 2016-05-31 MED ORDER — OXYCODONE-ACETAMINOPHEN 5-325 MG PO TABS
2.0000 | ORAL_TABLET | Freq: Once | ORAL | Status: AC
  Administered 2016-05-31: 2 via ORAL
  Filled 2016-05-31: qty 2

## 2016-05-31 NOTE — ED Triage Notes (Signed)
Brought in via ems s/p fall  States she slipped ion water in her kitchen  Landed on left knee

## 2016-05-31 NOTE — ED Notes (Signed)
Ice pack applied to left knee

## 2016-05-31 NOTE — ED Provider Notes (Signed)
[facility] Emergency Department Provider Note   ____________________________________________   First MD Initiated Contact with Patient 05/31/16 [phone]     (approximate)  I have reviewed the triage vital signs and the nursing notes.   HISTORY  Chief Complaint Fall    HPI Darlene Hubbard is a 65 y.o. female patient arrived via EMS secondary to knee pain. Patient states she slipped and fell in her kitchen. Patient said flow was wet from a leak refrigerator. Patient states she was unable to stand up called her neighbor who notified EMS. Patient rates the pain as a 7/10. Patient described a pain as "sharp". Patient was placed in a knee immobilizer by EMS. No other palliative measures for this complaint.  Past Medical History:  Diagnosis Date  . Allergy   . Asthma   . Bursitis    BOTH SHOULDER AND HIPS  . Fibromyalgia   . GERD (gastroesophageal reflux disease)   . Hiatal hernia   . IBS (irritable bowel syndrome)   . Mild obstructive sleep apnea   . Morbid obesity ([place])   . Spinal stenosis   . Varicose veins     Patient Active Problem List   Diagnosis Date Noted  . Family history of colon cancer 12/12/2015    Past Surgical History:  Procedure Laterality Date  . ABDOMINAL HYSTERECTOMY  1993  . APPENDECTOMY  1995  . BACK SURGERY    . BREAST BIOPSY Left 2000   neg  . CHOLECYSTECTOMY    . COLONOSCOPY  2008  . COLONOSCOPY WITH PROPOFOL N/A 02/18/2016   Procedure: COLONOSCOPY WITH PROPOFOL;  Surgeon: [name], MD;  Location: [facility] ENDOSCOPY;  Service: Endoscopy;  Laterality: N/A;  . PARTIAL HYSTERECTOMY    . TUBAL LIGATION    . UPPER GI ENDOSCOPY  2008  . vein closure procedure Bilateral 2010    Prior to Admission medications   Medication Sig Start Date End Date Taking? Authorizing Provider  ALPRAZolam Duanne Moron) 0.5 MG tablet  11/03/15   Historical Provider, MD  cetirizine (ZYRTEC) 10 MG tablet Take by mouth.    Historical Provider, MD    Cholecalciferol (VITAMIN D-3 PO) Take 2,000 Int'l Units by mouth.     Historical Provider, MD  Fluticasone-Salmeterol (ADVAIR) 100-50 MCG/DOSE AEPB Inhale 1 puff into the lungs 2 (two) times daily.    Historical Provider, MD  gabapentin (NEURONTIN) 300 MG capsule Take 300 mg by mouth 2 (two) times daily.  05/19/15   Historical Provider, MD  montelukast (SINGULAIR) 10 MG tablet Take 10 mg by mouth daily.    Historical Provider, MD  Multiple Vitamin (MULTI-VITAMINS) TABS Take by mouth.    Historical Provider, MD  oxyCODONE-acetaminophen (PERCOCET) 7.5-325 MG tablet Take 1 tablet by mouth every 4 (four) hours as needed for severe pain. 05/31/16 05/31/17  Sable Feil, PA-C  polyethylene glycol powder [facility]) powder 255 grams one bottle for colonoscopy prep 12/11/15   [name], MD  sertraline (ZOLOFT) 50 MG tablet Take by mouth.    Historical Provider, MD    Allergies Albuterol; Molds & smuts; Penicillins; Diovan [valsartan]; and Sulfa drugs cross reactors  Family History  Problem Relation Age of Onset  . Colon cancer Father 68  . Thrombocytopenia Mother 79  . Other Sister     Bone Marrow disorder    Social History Social History  Substance Use Topics  . Smoking status: Never Smoker  . Smokeless tobacco: Never Used  . Alcohol use No  Review of Systems Constitutional: No fever/chills Eyes: No visual changes. ENT: No sore throat. Cardiovascular: Denies chest pain. Respiratory: Denies shortness of breath. Gastrointestinal: No abdominal pain.  No nausea, no vomiting.  No diarrhea.  No constipation. Genitourinary: Negative for dysuria. Musculoskeletal:Positive for left knee pain  Skin: Negative for rash.Abrasion right elbow  Neurological: Negative for headaches, focal weakness or numbness. ____________________________________________   PHYSICAL EXAM:  VITAL SIGNS: ED Triage Vitals  Enc Vitals Group     BP 05/31/16 0723 (!) 141/74     Pulse Rate 05/31/16  0723 72     Resp 05/31/16 0723 20     Temp 05/31/16 0723 97.5 F (36.4 C)     Temp Source 05/31/16 0723 Oral     SpO2 05/31/16 0723 96 %     Weight 05/31/16 0724 177 lb (80.3 kg)     Height 05/31/16 0724 5\' 4"  (1.626 m)     Head Circumference --      Peak Flow --      Pain Score 05/31/16 0724 7     Pain Loc --      Pain Edu? --      Excl. in [place]? --     Constitutional: Alert and oriented. Well appearing and in no acute distress. Eyes: Conjunctivae are normal. PERRL. EOMI. Head: Atraumatic. Nose: No congestion/rhinnorhea. Mouth/Throat: Mucous membranes are moist.  Oropharynx non-erythematous. Neck: No stridor.  No cervical spine tenderness to palpation. Hematological/Lymphatic/Immunilogical: No cervical lymphadenopathy. Cardiovascular: Normal rate, regular rhythm. Grossly normal heart sounds.  Good peripheral circulation. Respiratory: Normal respiratory effort.  No retractions. Lungs CTAB. Gastrointestinal: Soft and nontender. No distention. No abdominal bruits. No CVA tenderness. Musculoskeletal: No lower extremity tenderness nor edema.  No joint effusions. Neurologic:  Normal speech and language. No gross focal neurologic deficits are appreciated. No gait instability. Skin:  Skin is warm, dry and intact. No rash noted.Abrasion posterior right elbow has anterior left patella. Obvious edema to the left patella.  Psychiatric: Mood and affect are normal. Speech and behavior are normal.  ____________________________________________   LABS (all labs ordered are listed, but only abnormal results are displayed)  Labs Reviewed - No data to display ____________________________________________  EKG   ____________________________________________  RADIOLOGY  X-rays reveal a comminuted fractured patella. Probable hematoma of the left knee. There is several displays fragments of the patella. ____________________________________________   PROCEDURES  Procedure(s) performed:  None  Procedures  Critical Care performed: No  ____________________________________________   INITIAL IMPRESSION / ASSESSMENT AND PLAN / ED COURSE  Pertinent labs & imaging results that were available during my care of the patient were reviewed by me and considered in my medical decision making (see chart for details).  Left patella fracture. Discussed x-ray finding with patient. Per discussion with. Dr. patient follow-up in his clinic tomorrow morning. Patient placed in a knee immobilizer and given crutches for ambulation.  Clinical Course     ____________________________________________   FINAL CLINICAL IMPRESSION(S) / ED DIAGNOSES  Final diagnoses:  Closed displaced fracture of left patella, unspecified fracture morphology, initial encounter      NEW MEDICATIONS STARTED DURING THIS VISIT:  New Prescriptions   OXYCODONE-ACETAMINOPHEN (PERCOCET) 7.5-325 MG TABLET    Take 1 tablet by mouth every 4 (four) hours as needed for severe pain.     Note:  This document was prepared using Dragon voice recognition software and may include unintentional dictation errors.    Sable Feil, PA-C 05/31/16 0913    Rudene Re, MD 06/01/16 [phone]

## 2016-05-31 NOTE — Discharge Instructions (Signed)
Wear knee immobilizer and ambulate with crutches pending orthopedic evaluation

## 2016-06-01 MED ORDER — CLINDAMYCIN PHOSPHATE 900 MG/50ML IV SOLN
900.0000 mg | INTRAVENOUS | Status: AC
  Administered 2016-06-02: 900 mg via INTRAVENOUS

## 2016-06-02 ENCOUNTER — Ambulatory Visit: Payer: Medicare Other | Admitting: Anesthesiology

## 2016-06-02 ENCOUNTER — Encounter: Payer: Self-pay | Admitting: *Deleted

## 2016-06-02 ENCOUNTER — Encounter
Admission: AD | Disposition: A | Discharge status: Skilled Nursing Facility | Payer: Self-pay | Source: Ambulatory Visit | Attending: Orthopedic Surgery

## 2016-06-02 ENCOUNTER — Ambulatory Visit: Payer: Medicare Other

## 2016-06-02 ENCOUNTER — Inpatient Hospital Stay
Admission: AD | Admit: 2016-06-02 | Discharge: 2016-06-04 | DRG: 502 | Disposition: A | Discharge status: Skilled Nursing Facility | Payer: Medicare Other | Source: Ambulatory Visit | Attending: Orthopedic Surgery | Admitting: Orthopedic Surgery

## 2016-06-02 DIAGNOSIS — M6281 Muscle weakness (generalized): Secondary | ICD-10-CM

## 2016-06-02 DIAGNOSIS — Z882 Allergy status to sulfonamides status: Secondary | ICD-10-CM | POA: Diagnosis not present

## 2016-06-02 DIAGNOSIS — R262 Difficulty in walking, not elsewhere classified: Secondary | ICD-10-CM

## 2016-06-02 DIAGNOSIS — Z79899 Other long term (current) drug therapy: Secondary | ICD-10-CM | POA: Diagnosis not present

## 2016-06-02 DIAGNOSIS — S80212A Abrasion, left knee, initial encounter: Secondary | ICD-10-CM | POA: Diagnosis present

## 2016-06-02 DIAGNOSIS — Z832 Family history of diseases of the blood and blood-forming organs and certain disorders involving the immune mechanism: Secondary | ICD-10-CM | POA: Diagnosis not present

## 2016-06-02 DIAGNOSIS — W010XXA Fall on same level from slipping, tripping and stumbling without subsequent striking against object, initial encounter: Secondary | ICD-10-CM | POA: Diagnosis present

## 2016-06-02 DIAGNOSIS — K449 Diaphragmatic hernia without obstruction or gangrene: Secondary | ICD-10-CM | POA: Diagnosis present

## 2016-06-02 DIAGNOSIS — Z79891 Long term (current) use of opiate analgesic: Secondary | ICD-10-CM | POA: Diagnosis not present

## 2016-06-02 DIAGNOSIS — J4520 Mild intermittent asthma, uncomplicated: Secondary | ICD-10-CM | POA: Diagnosis present

## 2016-06-02 DIAGNOSIS — S76192A Other specified injury of left quadriceps muscle, fascia and tendon, initial encounter: Secondary | ICD-10-CM | POA: Diagnosis present

## 2016-06-02 DIAGNOSIS — Y999 Unspecified external cause status: Secondary | ICD-10-CM

## 2016-06-02 DIAGNOSIS — Z6830 Body mass index (BMI) 30.0-30.9, adult: Secondary | ICD-10-CM

## 2016-06-02 DIAGNOSIS — Z806 Family history of leukemia: Secondary | ICD-10-CM

## 2016-06-02 DIAGNOSIS — Y939 Activity, unspecified: Secondary | ICD-10-CM

## 2016-06-02 DIAGNOSIS — S82042A Displaced comminuted fracture of left patella, initial encounter for closed fracture: Secondary | ICD-10-CM | POA: Diagnosis present

## 2016-06-02 DIAGNOSIS — M797 Fibromyalgia: Secondary | ICD-10-CM | POA: Diagnosis present

## 2016-06-02 DIAGNOSIS — Z888 Allergy status to other drugs, medicaments and biological substances status: Secondary | ICD-10-CM | POA: Diagnosis not present

## 2016-06-02 DIAGNOSIS — S82002A Unspecified fracture of left patella, initial encounter for closed fracture: Secondary | ICD-10-CM

## 2016-06-02 DIAGNOSIS — K219 Gastro-esophageal reflux disease without esophagitis: Secondary | ICD-10-CM | POA: Diagnosis present

## 2016-06-02 DIAGNOSIS — Z800 Family history of malignant neoplasm of digestive organs: Secondary | ICD-10-CM

## 2016-06-02 DIAGNOSIS — G4733 Obstructive sleep apnea (adult) (pediatric): Secondary | ICD-10-CM | POA: Diagnosis present

## 2016-06-02 DIAGNOSIS — Y92000 Kitchen of unspecified non-institutional (private) residence as  the place of occurrence of the external cause: Secondary | ICD-10-CM

## 2016-06-02 DIAGNOSIS — Z880 Allergy status to penicillin: Secondary | ICD-10-CM | POA: Diagnosis not present

## 2016-06-02 DIAGNOSIS — S50311A Abrasion of right elbow, initial encounter: Secondary | ICD-10-CM | POA: Diagnosis present

## 2016-06-02 DIAGNOSIS — Z7951 Long term (current) use of inhaled steroids: Secondary | ICD-10-CM

## 2016-06-02 DIAGNOSIS — Z419 Encounter for procedure for purposes other than remedying health state, unspecified: Secondary | ICD-10-CM

## 2016-06-02 HISTORY — PX: ORIF PATELLA: SHX5033

## 2016-06-02 HISTORY — PX: PATELLAR TENDON REPAIR: SHX737

## 2016-06-02 SURGERY — OPEN REDUCTION INTERNAL FIXATION (ORIF) PATELLA
Anesthesia: General | Site: Knee | Laterality: Left | Wound class: Clean

## 2016-06-02 MED ORDER — MAGNESIUM HYDROXIDE 400 MG/5ML PO SUSP: 30.0000 mL | Freq: Every day | ORAL | Status: DC | PRN

## 2016-06-02 MED ORDER — PHENOL 1.4 % MT LIQD
1.0000 | OROMUCOSAL | Status: DC | PRN
  Filled 2016-06-02: qty 177

## 2016-06-02 MED ORDER — HYDROMORPHONE HCL 1 MG/ML IJ SOLN
INTRAMUSCULAR | Status: AC
Start: 2016-06-02 — End: 2016-06-03
  Filled 2016-06-02: qty 1

## 2016-06-02 MED ORDER — ADULT MULTIVITAMIN W/MINERALS CH
1.0000 | ORAL_TABLET | Freq: Every day | ORAL | Status: DC
  Administered 2016-06-03 – 2016-06-04 (×2): 1 via ORAL
  Filled 2016-06-02 (×2): qty 1

## 2016-06-02 MED ORDER — ALPRAZOLAM 0.5 MG PO TABS
0.5000 mg | ORAL_TABLET | Freq: Three times a day (TID) | ORAL | Status: DC | PRN
  Administered 2016-06-03 – 2016-06-04 (×3): 0.5 mg via ORAL
  Filled 2016-06-02 (×3): qty 1

## 2016-06-02 MED ORDER — ONDANSETRON HCL 4 MG PO TABS: 4.0000 mg | ORAL_TABLET | Freq: Four times a day (QID) | ORAL | Status: DC | PRN

## 2016-06-02 MED ORDER — ACETAMINOPHEN 10 MG/ML IV SOLN
1000.0000 mg | Freq: Once | INTRAVENOUS | Status: AC
  Administered 2016-06-02: 1000 mg via INTRAVENOUS

## 2016-06-02 MED ORDER — BISACODYL 10 MG RE SUPP: 10.0000 mg | Freq: Every day | RECTAL | Status: DC | PRN

## 2016-06-02 MED ORDER — CLINDAMYCIN PHOSPHATE 600 MG/50ML IV SOLN
600.0000 mg | Freq: Four times a day (QID) | INTRAVENOUS | Status: AC
  Administered 2016-06-03 (×2): 600 mg via INTRAVENOUS
  Filled 2016-06-02 (×2): qty 50

## 2016-06-02 MED ORDER — OXYCODONE HCL 5 MG/5ML PO SOLN: 5.0000 mg | Freq: Once | ORAL | Status: DC | PRN

## 2016-06-02 MED ORDER — ACETAMINOPHEN 10 MG/ML IV SOLN
INTRAVENOUS | Status: AC
  Administered 2016-06-02: 22:00:00
  Filled 2016-06-02: qty 100

## 2016-06-02 MED ORDER — SERTRALINE HCL 50 MG PO TABS
50.0000 mg | ORAL_TABLET | Freq: Every day | ORAL | Status: DC
  Administered 2016-06-03 – 2016-06-04 (×2): 50 mg via ORAL
  Filled 2016-06-02 (×3): qty 1

## 2016-06-02 MED ORDER — ONDANSETRON HCL 4 MG/2ML IJ SOLN
INTRAMUSCULAR | Status: DC | PRN
  Administered 2016-06-02: 4 mg via INTRAVENOUS

## 2016-06-02 MED ORDER — LORATADINE 10 MG PO TABS
10.0000 mg | ORAL_TABLET | Freq: Every day | ORAL | Status: DC
  Administered 2016-06-03 – 2016-06-04 (×2): 10 mg via ORAL
  Filled 2016-06-02 (×2): qty 1

## 2016-06-02 MED ORDER — MONTELUKAST SODIUM 10 MG PO TABS
10.0000 mg | ORAL_TABLET | Freq: Every day | ORAL | Status: DC
  Administered 2016-06-03 – 2016-06-04 (×2): 10 mg via ORAL
  Filled 2016-06-02 (×2): qty 1

## 2016-06-02 MED ORDER — MENTHOL 3 MG MT LOZG
1.0000 | LOZENGE | OROMUCOSAL | Status: DC | PRN
  Filled 2016-06-02: qty 9

## 2016-06-02 MED ORDER — NEOMYCIN-POLYMYXIN B GU 40-200000 IR SOLN
Status: DC | PRN
  Administered 2016-06-02: 2 mL

## 2016-06-02 MED ORDER — HYDROMORPHONE HCL 1 MG/ML IJ SOLN
0.2500 mg | INTRAMUSCULAR | Status: DC | PRN
  Administered 2016-06-02 (×4): 0.25 mg via INTRAVENOUS

## 2016-06-02 MED ORDER — FERROUS SULFATE 325 (65 FE) MG PO TABS
325.0000 mg | ORAL_TABLET | Freq: Two times a day (BID) | ORAL | Status: DC
  Administered 2016-06-03 – 2016-06-04 (×3): 325 mg via ORAL
  Filled 2016-06-02 (×3): qty 1

## 2016-06-02 MED ORDER — LACTATED RINGERS IV SOLN
INTRAVENOUS | Status: DC
  Administered 2016-06-02 (×2): via INTRAVENOUS

## 2016-06-02 MED ORDER — PROPOFOL 10 MG/ML IV BOLUS
INTRAVENOUS | Status: DC | PRN
  Administered 2016-06-02: 140 mg via INTRAVENOUS

## 2016-06-02 MED ORDER — CHLORHEXIDINE GLUCONATE 4 % EX LIQD: 60.0000 mL | Freq: Once | CUTANEOUS | Status: DC

## 2016-06-02 MED ORDER — ACETAMINOPHEN 10 MG/ML IV SOLN
1000.0000 mg | Freq: Four times a day (QID) | INTRAVENOUS | Status: AC
  Administered 2016-06-03 (×4): 1000 mg via INTRAVENOUS
  Filled 2016-06-02 (×4): qty 100

## 2016-06-02 MED ORDER — FENTANYL CITRATE (PF) 100 MCG/2ML IJ SOLN
INTRAMUSCULAR | Status: AC
  Filled 2016-06-02: qty 2

## 2016-06-02 MED ORDER — FENTANYL CITRATE (PF) 100 MCG/2ML IJ SOLN
25.0000 ug | INTRAMUSCULAR | Status: DC | PRN
  Administered 2016-06-02 (×4): 25 ug via INTRAVENOUS

## 2016-06-02 MED ORDER — MORPHINE SULFATE (PF) 2 MG/ML IV SOLN: 2.0000 mg | INTRAVENOUS | Status: DC | PRN

## 2016-06-02 MED ORDER — MIDAZOLAM HCL 2 MG/2ML IJ SOLN
INTRAMUSCULAR | Status: DC | PRN
  Administered 2016-06-02: 2 mg via INTRAVENOUS

## 2016-06-02 MED ORDER — CLINDAMYCIN PHOSPHATE 900 MG/50ML IV SOLN
INTRAVENOUS | Status: AC
  Filled 2016-06-02: qty 50

## 2016-06-02 MED ORDER — METOCLOPRAMIDE HCL 10 MG PO TABS
10.0000 mg | ORAL_TABLET | Freq: Three times a day (TID) | ORAL | Status: DC
  Administered 2016-06-03 – 2016-06-04 (×5): 10 mg via ORAL
  Filled 2016-06-02 (×4): qty 1

## 2016-06-02 MED ORDER — METOCLOPRAMIDE HCL 5 MG/ML IJ SOLN
5.0000 mg | Freq: Three times a day (TID) | INTRAMUSCULAR | Status: DC | PRN
  Administered 2016-06-02: 10 mg via INTRAVENOUS
  Filled 2016-06-02: qty 2

## 2016-06-02 MED ORDER — ONDANSETRON HCL 4 MG/2ML IJ SOLN: 4.0000 mg | Freq: Four times a day (QID) | INTRAMUSCULAR | Status: DC | PRN

## 2016-06-02 MED ORDER — BUPIVACAINE-EPINEPHRINE (PF) 0.25% -1:200000 IJ SOLN
INTRAMUSCULAR | Status: AC
  Filled 2016-06-02: qty 30

## 2016-06-02 MED ORDER — SODIUM CHLORIDE 0.9 % IV SOLN
INTRAVENOUS | Status: DC
  Administered 2016-06-02: 23:00:00 via INTRAVENOUS

## 2016-06-02 MED ORDER — OXYCODONE HCL 5 MG PO TABS
5.0000 mg | ORAL_TABLET | ORAL | Status: DC | PRN
  Administered 2016-06-02: 5 mg via ORAL
  Administered 2016-06-03 – 2016-06-04 (×5): 10 mg via ORAL
  Filled 2016-06-02 (×3): qty 2
  Filled 2016-06-02: qty 1
  Filled 2016-06-02 (×2): qty 2

## 2016-06-02 MED ORDER — METOCLOPRAMIDE HCL 10 MG PO TABS
5.0000 mg | ORAL_TABLET | Freq: Three times a day (TID) | ORAL | Status: DC | PRN
  Filled 2016-06-02: qty 1

## 2016-06-02 MED ORDER — BUPIVACAINE-EPINEPHRINE (PF) 0.25% -1:200000 IJ SOLN
INTRAMUSCULAR | Status: DC | PRN
  Administered 2016-06-02: 10 mL via PERINEURAL

## 2016-06-02 MED ORDER — MEPERIDINE HCL 25 MG/ML IJ SOLN: 6.2500 mg | INTRAMUSCULAR | Status: DC | PRN

## 2016-06-02 MED ORDER — ENOXAPARIN SODIUM 40 MG/0.4ML ~~LOC~~ SOLN
40.0000 mg | SUBCUTANEOUS | Status: DC
  Administered 2016-06-03 – 2016-06-04 (×2): 40 mg via SUBCUTANEOUS
  Filled 2016-06-02 (×2): qty 0.4

## 2016-06-02 MED ORDER — PROMETHAZINE HCL 25 MG/ML IJ SOLN: 6.2500 mg | INTRAMUSCULAR | Status: DC | PRN

## 2016-06-02 MED ORDER — NEOMYCIN-POLYMYXIN B GU 40-200000 IR SOLN
Status: AC
  Filled 2016-06-02: qty 2

## 2016-06-02 MED ORDER — TRAMADOL HCL 50 MG PO TABS
50.0000 mg | ORAL_TABLET | ORAL | Status: DC | PRN
  Administered 2016-06-03 (×2): 100 mg via ORAL
  Administered 2016-06-03 – 2016-06-04 (×3): 50 mg via ORAL
  Filled 2016-06-02: qty 1
  Filled 2016-06-02: qty 2
  Filled 2016-06-02: qty 1
  Filled 2016-06-02: qty 2
  Filled 2016-06-02: qty 1

## 2016-06-02 MED ORDER — FLEET ENEMA 7-19 GM/118ML RE ENEM: 1.0000 | ENEMA | Freq: Once | RECTAL | Status: DC | PRN

## 2016-06-02 MED ORDER — VITAMIN D 1000 UNITS PO TABS
2000.0000 [IU] | ORAL_TABLET | Freq: Every day | ORAL | Status: DC
  Administered 2016-06-03 – 2016-06-04 (×2): 2000 [IU] via ORAL
  Filled 2016-06-02 (×2): qty 2

## 2016-06-02 MED ORDER — PANTOPRAZOLE SODIUM 40 MG PO TBEC
40.0000 mg | DELAYED_RELEASE_TABLET | Freq: Two times a day (BID) | ORAL | Status: DC
  Administered 2016-06-02 – 2016-06-04 (×4): 40 mg via ORAL
  Filled 2016-06-02 (×4): qty 1

## 2016-06-02 MED ORDER — ACETAMINOPHEN 325 MG PO TABS: 650.0000 mg | ORAL_TABLET | Freq: Four times a day (QID) | ORAL | Status: DC | PRN

## 2016-06-02 MED ORDER — OXYCODONE HCL 5 MG PO TABS: 5.0000 mg | ORAL_TABLET | Freq: Once | ORAL | Status: DC | PRN

## 2016-06-02 MED ORDER — SENNOSIDES-DOCUSATE SODIUM 8.6-50 MG PO TABS
1.0000 | ORAL_TABLET | Freq: Two times a day (BID) | ORAL | Status: DC
  Administered 2016-06-02 – 2016-06-03 (×3): 1 via ORAL
  Filled 2016-06-02 (×4): qty 1

## 2016-06-02 MED ORDER — GABAPENTIN 300 MG PO CAPS
300.0000 mg | ORAL_CAPSULE | Freq: Two times a day (BID) | ORAL | Status: DC
  Administered 2016-06-02 – 2016-06-04 (×4): 300 mg via ORAL
  Filled 2016-06-02 (×4): qty 1

## 2016-06-02 MED ORDER — ROCURONIUM BROMIDE 100 MG/10ML IV SOLN
INTRAVENOUS | Status: DC | PRN
  Administered 2016-06-02: 40 mg via INTRAVENOUS

## 2016-06-02 MED ORDER — SUCCINYLCHOLINE CHLORIDE 20 MG/ML IJ SOLN
INTRAMUSCULAR | Status: DC | PRN
  Administered 2016-06-02: 80 mg via INTRAVENOUS

## 2016-06-02 MED ORDER — ONDANSETRON HCL 4 MG/2ML IJ SOLN
INTRAMUSCULAR | Status: AC
  Administered 2016-06-02: 4 mg
  Filled 2016-06-02: qty 2

## 2016-06-02 MED ORDER — FENTANYL CITRATE (PF) 100 MCG/2ML IJ SOLN
INTRAMUSCULAR | Status: DC | PRN
  Administered 2016-06-02: 50 ug via INTRAVENOUS
  Administered 2016-06-02: 25 ug via INTRAVENOUS
  Administered 2016-06-02 (×2): 50 ug via INTRAVENOUS
  Administered 2016-06-02: 25 ug via INTRAVENOUS

## 2016-06-02 MED ORDER — MOMETASONE FURO-FORMOTEROL FUM 100-5 MCG/ACT IN AERO
2.0000 | INHALATION_SPRAY | Freq: Two times a day (BID) | RESPIRATORY_TRACT | Status: DC
  Administered 2016-06-03 – 2016-06-04 (×2): 2 via RESPIRATORY_TRACT
  Filled 2016-06-02: qty 8.8

## 2016-06-02 MED ORDER — ACETAMINOPHEN 650 MG RE SUPP: 650.0000 mg | Freq: Four times a day (QID) | RECTAL | Status: DC | PRN

## 2016-06-02 SURGICAL SUPPLY — 53 items
1.35 GUIDEWIRE ×2 IMPLANT
BIT DRILL 2.6 CANN (BIT) ×2 IMPLANT
BRACE KNEE POST OP SHORT (BRACE) ×2 IMPLANT
CANISTER SUCT 1200ML W/VALVE (MISCELLANEOUS) ×2 IMPLANT
CANNULATED DRILL IMPLANT
COOLER POLAR GLACIER W/PUMP (MISCELLANEOUS) ×2 IMPLANT
CUFF TOURN 24 STER (MISCELLANEOUS) IMPLANT
CUFF TOURN 30 STER DUAL PORT (MISCELLANEOUS) IMPLANT
DECANTER SPIKE VIAL GLASS SM (MISCELLANEOUS) ×2 IMPLANT
DRAPE C-ARM XRAY 36X54 (DRAPES) ×2 IMPLANT
DRAPE C-ARMOR (DRAPES) ×2 IMPLANT
DRSG DERMACEA 8X12 NADH (GAUZE/BANDAGES/DRESSINGS) ×2 IMPLANT
DRSG OPSITE POSTOP 4X12 (GAUZE/BANDAGES/DRESSINGS) ×2 IMPLANT
DURAPREP 26ML APPLICATOR (WOUND CARE) ×4 IMPLANT
ELECT CAUTERY BLADE 6.4 (BLADE) ×2 IMPLANT
ELECT REM PT RETURN 9FT ADLT (ELECTROSURGICAL) ×2
ELECTRODE REM PT RTRN 9FT ADLT (ELECTROSURGICAL) ×1 IMPLANT
FIBERTAPE WITH BRAIDED POLYBLEND SUTURE BLUE WITH STRAIGHT NEEDLE ×2 IMPLANT
GAUZE SPONGE 4X4 12PLY STRL (GAUZE/BANDAGES/DRESSINGS) ×2 IMPLANT
GLOVE BIO SURGEON STRL SZ8 (GLOVE) ×2 IMPLANT
GLOVE BIOGEL M STRL SZ7.5 (GLOVE) ×2 IMPLANT
GLOVE SURG XRAY 8.5 LX (GLOVE) ×2 IMPLANT
GOWN SRG 2XL LVL 4 RGLN SLV (GOWNS) ×1 IMPLANT
GOWN STRL NON-REIN 2XL LVL4 (GOWNS) ×2
GOWN STRL REUS W/ TWL LRG LVL3 (GOWN DISPOSABLE) ×2 IMPLANT
GOWN STRL REUS W/TWL LRG LVL3 (GOWN DISPOSABLE) ×4
HEMOVAC 400CC 10FR (MISCELLANEOUS) ×2 IMPLANT
IV CATH ANGIO 14GX3.25 ORG (MISCELLANEOUS) ×2 IMPLANT
KIT RM TURNOVER STRD PROC AR (KITS) ×2 IMPLANT
NEEDLE HYPO 25X1 1.5 SAFETY (NEEDLE) ×2 IMPLANT
NS IRRIG 500ML POUR BTL (IV SOLUTION) ×2 IMPLANT
PACK TOTAL KNEE (MISCELLANEOUS) ×2 IMPLANT
PAD WRAPON POLAR KNEE (MISCELLANEOUS) ×1 IMPLANT
SCREW 4X36MM CANN LO-PRO (Screw) ×4 IMPLANT
SOL PREP PVP 2OZ (MISCELLANEOUS) ×2
SOLUTION PREP PVP 2OZ (MISCELLANEOUS) ×1 IMPLANT
STAPLER SKIN PROX 35W (STAPLE) ×2 IMPLANT
STOCKINETTE M/LG 89821 (MISCELLANEOUS) ×2 IMPLANT
SUCTION FRAZIER HANDLE 10FR (MISCELLANEOUS) ×1
SUCTION TUBE FRAZIER 10FR DISP (MISCELLANEOUS) ×1 IMPLANT
SUT ETHIBOND NAB CT1 #1 30IN (SUTURE) ×2 IMPLANT
SUT FIBERWIRE #5 38 CONV BLUE (SUTURE) ×4
SUT ORTHOCORD W/MULTIPK NDL (SUTURE) ×2 IMPLANT
SUT STEEL 7 (SUTURE) ×2 IMPLANT
SUT VIC AB 0 CT1 27 (SUTURE) ×2
SUT VIC AB 0 CT1 27XCR 8 STRN (SUTURE) ×1 IMPLANT
SUT VIC AB 0 CT1 36 (SUTURE) ×2 IMPLANT
SUT VIC AB 2-0 CT1 27 (SUTURE) ×2
SUT VIC AB 2-0 CT1 TAPERPNT 27 (SUTURE) ×1 IMPLANT
SUTURE FIBERWR #5 38 CONV BLUE (SUTURE) ×2 IMPLANT
SYR 30ML LL (SYRINGE) ×2 IMPLANT
SYRINGE 10CC LL (SYRINGE) ×2 IMPLANT
WRAPON POLAR PAD KNEE (MISCELLANEOUS) ×2

## 2016-06-02 NOTE — Transfer of Care (Signed)
Immediate Anesthesia Transfer of Care Note  Patient: Darlene Hubbard  Procedure(s) Performed: Procedure(s): OPEN REDUCTION INTERNAL (ORIF) FIXATION PATELLA (Left) PATELLA TENDON REPAIR (Left)  Patient Location: PACU  Anesthesia Type:General  Level of Consciousness: awake, alert  and oriented  Airway & Oxygen Therapy: Patient Spontanous Breathing  Post-op Assessment: Report given to RN  Post vital signs: Reviewed and stable  Last Vitals:  Vitals:   06/02/16 2100 06/02/16 2114  BP:  136/63  Pulse:  85  Resp:    Temp: (P) 36.7 C 36.6 C    Last Pain:  Vitals:   06/02/16 1410  TempSrc: Tympanic  PainSc: 0-No pain         Complications: No apparent anesthesia complications

## 2016-06-02 NOTE — Brief Op Note (Signed)
06/02/2016  9:19 PM  PATIENT:  Darlene Hubbard  65 y.o. female  PRE-OPERATIVE DIAGNOSIS:  closed displaced comminuted fracture of left patella  POST-OPERATIVE DIAGNOSIS:  closed displaced comminuted fracture of left patella  PROCEDURE:   Open reduction and internal fixation of the left patella fracture  SURGEON:  Surgeon(s) and Role:    * Dereck Leep, MD - Primary  ASSISTANTS: none   ANESTHESIA:   general  EBL:  Total I/O In: 1600 [I.V.:1600] Out: 250 [Urine:200; Blood:50]  BLOOD ADMINISTERED:none  DRAINS: none   LOCAL MEDICATIONS USED:  MARCAINE     SPECIMEN:  No Specimen  DISPOSITION OF SPECIMEN:  N/A  COUNTS:  YES  TOURNIQUET:   Total Tourniquet Time Documented: Thigh (Left) - 83 minutes Total: Thigh (Left) - 83 minutes   DICTATION: .Viviann Spare Dictation  PLAN OF CARE: Admit to inpatient   PATIENT DISPOSITION:  PACU - hemodynamically stable.   Delay start of Pharmacological VTE agent (>24hrs) due to surgical blood loss or risk of bleeding: yes

## 2016-06-02 NOTE — Anesthesia Preprocedure Evaluation (Addendum)
Anesthesia Evaluation  Patient identified by MRN, date of birth, ID band Patient awake    Reviewed: Allergy & Precautions, NPO status , Patient's Chart, lab work & pertinent test results  History of Anesthesia Complications Negative for: history of anesthetic complications  Airway Mallampati: II  TM Distance: >3 FB Neck ROM: Full    Dental  (+) Missing, Poor Dentition   Pulmonary asthma (mild intermittent, uses inhaler with seasonal changes) , sleep apnea (mild, does not use CPAP) ,    breath sounds clear to auscultation- rhonchi (-) wheezing      Cardiovascular Exercise Tolerance: Good (-) hypertension(-) CAD and (-) Past MI  Rhythm:Regular Rate:Normal - Systolic murmurs and - Diastolic murmurs    Neuro/Psych Depression negative neurological ROS     GI/Hepatic Neg liver ROS, GERD  ,  Endo/Other  negative endocrine ROSneg diabetes  Renal/GU negative Renal ROS     Musculoskeletal  (+) Fibromyalgia -  Abdominal (+) + obese,   Peds  Hematology negative hematology ROS (+)   Anesthesia Other Findings Past Medical History: No date: Allergy No date: Asthma No date: Bursitis     Comment: BOTH SHOULDER AND HIPS No date: Fibromyalgia No date: GERD (gastroesophageal reflux disease) No date: Hiatal hernia No date: IBS (irritable bowel syndrome) No date: Mild obstructive sleep apnea No date: Morbid obesity ([place]) No date: Spinal stenosis No date: Varicose veins   Reproductive/Obstetrics                             Anesthesia Physical Anesthesia Plan  ASA: II  Anesthesia Plan: General   Post-op Pain Management:    Induction: Intravenous  Airway Management Planned: Oral ETT  Additional Equipment:   Intra-op Plan:   Post-operative Plan: Extubation in OR  Informed Consent: I have reviewed the patients History and Physical, chart, labs and discussed the procedure including the risks,  benefits and alternatives for the proposed anesthesia with the patient or authorized representative who has indicated his/her understanding and acceptance.   Dental advisory given  Plan Discussed with: Anesthesiologist and CRNA  Anesthesia Plan Comments:        Anesthesia Quick Evaluation

## 2016-06-02 NOTE — Anesthesia Procedure Notes (Signed)
Procedure Name: Intubation Date/Time: 06/02/2016 6:32 PM Performed by: [id number], Nasiir Monts Pre-anesthesia Checklist: Emergency Drugs available, Suction available, Patient being monitored, Timeout performed and Patient identified Patient Re-evaluated:Patient Re-evaluated prior to inductionOxygen Delivery Method: Circle system utilized Preoxygenation: Pre-oxygenation with 100% oxygen Intubation Type: IV induction Laryngoscope Size: Mac and 3 Grade View: Grade I Tube type: Oral Number of attempts: 1 Airway Equipment and Method: Stylet Secured at: 22 cm Tube secured with: Tape

## 2016-06-02 NOTE — Progress Notes (Signed)
Pharmacy note Baseline serum creatinine ordered before first Lovenox dose per MED 710-02.

## 2016-06-02 NOTE — Anesthesia Postprocedure Evaluation (Signed)
Anesthesia Post Note  Patient: Darlene Hubbard  Procedure(s) Performed: Procedure(s) (LRB): OPEN REDUCTION INTERNAL (ORIF) FIXATION PATELLA (Left) PATELLA TENDON REPAIR (Left)  Patient location during evaluation: PACU Anesthesia Type: General Level of consciousness: awake and alert and oriented Pain management: pain level controlled Vital Signs Assessment: post-procedure vital signs reviewed and stable Respiratory status: spontaneous breathing, nonlabored ventilation and respiratory function stable Cardiovascular status: blood pressure returned to baseline and stable Postop Assessment: no signs of nausea or vomiting Anesthetic complications: no    Last Vitals:  Vitals:   06/02/16 2157 06/02/16 2200  BP:  125/72  Pulse: (!) 105 85  Resp: 14 12  Temp:      Last Pain:  Vitals:   06/02/16 2157  TempSrc:   PainSc: 5                  Honesty Menta

## 2016-06-02 NOTE — Op Note (Signed)
OPERATIVE NOTE  DATE OF SURGERY:  06/02/2016  PATIENT NAME:  Darlene Hubbard   DOB: [birth year]  MRN: [id number]  PRE-OPERATIVE DIAGNOSIS: Fracture of the left patella  POST-OPERATIVE DIAGNOSIS:  Same  PROCEDURE:  Open reduction and internal fixation of the left patella  SURGEON:  Marciano Sequin. M.D.  ASSISTANT:  None  ANESTHESIA: general  ESTIMATED BLOOD LOSS: 25 mL  FLUIDS REPLACED: 1600 mL of crystalloid  TOURNIQUET TIME: 83 minutes  DRAINS: None  IMPLANTS UTILIZED: Arthrex 4.0 mm x 36 mm blunt tipped cannulated screws 2, Fibertape  INDICATIONS FOR SURGERY: Darlene Hubbard is a 64 y.o. year old female who fell and sustained a fracture of the left patella. After discussion of the risks and benefits of surgical intervention, the patient expressed understanding of the risks benefits and agree with plans for open reduction and internal fixation of the fracture.   The risks, benefits, and alternatives were discussed at length including but not limited to the risks of infection, bleeding, nerve injury, stiffness, blood clots, the need for revision surgery, cardiopulmonary complications, among others, and they were willing to proceed.  PROCEDURE IN DETAIL: The patient was brought into the operating room and, after adequate general anesthesia was achieved, a tourniquet was placed on the patient's upper thigh. The patient's knee and leg were cleaned and prepped with alcohol and DuraPrep and draped in the usual sterile fashion. A "timeout" was performed as per usual protocol. The lower extremity was exsanguinated using an Esmarch, and the tourniquet was inflated to 300 mmHg. An anterior longitudinal incision was made and dissection was carried down to the retinaculum. A hemarthrosis was evacuated and the joint was irrigated with copious amounts of normal saline with antibiotic solution. The fracture site was carefully debrided of hematoma and soft tissue. Two K wires were inserted in a  parallel fashion through the proximal fragment. Next, a provisional reduction was performed and maintained using bone reduction forceps with points. Position and reduction was confirmed using C-arm. The K wires were then directed through the inferior patellar fragments. Again, reduction and position of K wires was confirmed using the C-arm. Two 4.0 mm x 36 mm cannulated screws were inserted over the guidewires and position confirmed. The guidewires were removed and the Fibertape was passed through the cannulated screws and crossed anteriorly. The Fibertape was tied and the knot buried inferiorly. Excellent compression at the fracture site was appreciated. The articular surface was assessed by palpation through the tear of the retinaculum with only a slight step-off appreciated. Tourniquet was deflated after total tourniquet time of 83 minutes. Hemostasis was achieved using electrocautery. The retinaculum was repaired using #1 Ethibond. Subcutaneous tissue was approximated in layers using first #0 Vicryl followed by #2-0 Vicryl. Skin was closed with skin staples. 0.25% Marcaine with epinephrine was injected along the incision site. A sterile dressing was applied followed by application of a knee range of motion brace which was locked in extension.  The patient tolerated the procedure well and was transported to the recovery room in stable condition.  James P. Holley Bouche M.D.

## 2016-06-03 ENCOUNTER — Encounter: Payer: Self-pay | Admitting: Orthopedic Surgery

## 2016-06-03 DIAGNOSIS — S82002A Unspecified fracture of left patella, initial encounter for closed fracture: Secondary | ICD-10-CM

## 2016-06-03 LAB — CREATININE, SERUM
Creatinine, Ser: 0.54 mg/dL (ref 0.44–1.00)
GFR calc Af Amer: >60 mL/min (ref >60)
GFR calc non Af Amer: >60 mL/min (ref >60)

## 2016-06-03 MED ORDER — POTASSIUM CHLORIDE CRYS ER 20 MEQ PO TBCR
20.0000 meq | EXTENDED_RELEASE_TABLET | Freq: Three times a day (TID) | ORAL | Status: DC
  Administered 2016-06-03 – 2016-06-04 (×4): 20 meq via ORAL
  Filled 2016-06-03 (×4): qty 1

## 2016-06-03 NOTE — Evaluation (Signed)
Physical Therapy Evaluation Patient Details Name: Darlene Hubbard MRN: [id number] DOB: 1951-05-10 Today's Date: 06/03/2016   History of Present Illness  65 y/o female here after fall with L patellar fx and ORIF.  Clinical Impression  Pt did well with PT exam showing good overall mobility and only minimal pain t/o the session.  She was able to perform ~10 minutes of supine exercises with good confidence and good relative strength.  She does not have any assist available at home and will be reliant on the walker for mobility/ambulation for some time.  Pt eager to get home, but would not be safe at this time given her situation.    Follow Up Recommendations SNF    Equipment Recommendations   (has RW, DME TBD @ [facility])    Recommendations for Other Services       Precautions / Restrictions Precautions Precautions: Fall;Knee Required Braces or Orthoses: Knee Immobilizer - Left Knee Immobilizer - Left: On at all times Restrictions LLE Weight Bearing: Weight bearing as tolerated      Mobility  Bed Mobility Overal bed mobility: Modified Independent             General bed mobility comments: Pt needing only light use of rails to go supine to sitting EOB  Transfers Overall transfer level: Modified independent Equipment used: Rolling walker (2 wheeled)             General transfer comment: Pt shows good confidence with getting to standing, able to maintain balance and rise w/o direct physical assist - cues for UE use and positioning  Ambulation/Gait Ambulation/Gait assistance: Min guard Ambulation Distance (Feet): 50 Feet Assistive device: Rolling walker (2 wheeled)       General Gait Details: Pt did well with ambulation and showed only minimal hesitation with L WBing.  She had to modify cadence secondary to KI, but ultimately showed good safety and confidence.  She did have some fatigue and was reliant on walker/UE use but ultimately did quite well.  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, able to do all errands, driving, etcw/o issue     Hand Dominance        Extremity/Trunk Assessment   Upper Extremity Assessment: Overall WFL for tasks assessed           Lower Extremity Assessment: Overall WFL for tasks assessed (L LE grossly 3+/5, able to do SLRs, knee flex NT)         Communication   Communication: No difficulties  Cognition Arousal/Alertness: Awake/alert Behavior During Therapy: WFL for tasks assessed/performed Overall Cognitive Status: Within  Functional Limits for tasks assessed                      General Comments      Exercises General Exercises - Lower Extremity Ankle Circles/Pumps: AROM;10 reps Quad Sets: Strengthening;10 reps Gluteal Sets: Strengthening;10 reps Hip ABduction/ADduction: Strengthening;10 reps Straight Leg Raises: AROM;10 reps   Assessment/Plan    PT Assessment Patient needs continued PT services  PT Problem List Decreased strength;Decreased range of motion;Decreased activity tolerance;Decreased balance;Decreased mobility;Decreased safety awareness;Decreased knowledge of use of DME          PT Treatment Interventions Gait training;DME instruction;Functional mobility training;Therapeutic activities;Therapeutic exercise;Balance training;Patient/family education    PT Goals (Current goals can be  found in the Care Plan section)  Acute Rehab PT Goals Patient Stated Goal: get back to walking well PT Goal Formulation: With patient Time For Goal Achievement: 06/17/16 Potential to Achieve Goals: Good    Frequency BID   Barriers to discharge        Co-evaluation               End of Session Equipment Utilized During Treatment: Gait belt;Left knee immobilizer Activity Tolerance: Patient tolerated treatment well Patient left: with chair alarm set;with call bell/phone within reach           Time: 0807-0835 PT Time Calculation (min) (ACUTE ONLY): 28 min   Charges:   PT Evaluation $PT Eval Low Complexity: 1 Procedure PT Treatments $Therapeutic Exercise: 8-22 mins   PT G Codes:        Kreg Shropshire, DPT 06/03/2016, 9:23 AM

## 2016-06-03 NOTE — Care Management (Signed)
RNCM consult received and will continue to follow. Patient from home alone. PT is currently recommending SNF. CSW will also follow.

## 2016-06-03 NOTE — Progress Notes (Signed)
Chaplain rounding visited patient to introduce chaplain services and saw patient who was very pleased that the chaplain visited. Patient talked about a lot of things with chaplain before asking for prayers for healing. Chaplain provided prayers for healing and spiritual support for the patient who was very grateful.     06/03/16 1500  Clinical Encounter Type  Visited With Patient  Visit Type Spiritual support  Referral From Nurse  Spiritual Encounters  Spiritual Needs Prayer

## 2016-06-03 NOTE — Discharge Instructions (Signed)
Knee immobilizer to be worn at all times except for washing the leg and checking the skin Weight bear as tolerated Regular diet Pain meds as instructed Keep wound clean and dry No showers until the staples are removed Follow up in clinic as scheduled Call [phone] if any problems May need to take Senakot S (over the counter) as a stool softner if using a lot of narcotics

## 2016-06-03 NOTE — Discharge Summary (Signed)
Physician Discharge Summary  Patient ID: Darlene Hubbard MRN: [id number] DOB/AGE: 1950/12/09 65 y.o.  Admit date: 06/02/2016 Discharge date: 06/04/2016  Admission Diagnoses:  closed displaced comminuted fracture of left patella   Discharge Diagnoses: Patient Active Problem List   Diagnosis Date Noted  . Left patella fracture 06/03/2016  . Family history of colon cancer 12/12/2015    Past Medical History:  Diagnosis Date  . Allergy   . Asthma   . Bursitis    BOTH SHOULDER AND HIPS  . Fibromyalgia   . GERD (gastroesophageal reflux disease)   . Hiatal hernia   . IBS (irritable bowel syndrome)   . Mild obstructive sleep apnea   . Morbid obesity ([place])   . Spinal stenosis   . Varicose veins      Transfusion: none   Consultants (if any): none  Discharged Condition: Improved  Hospital Course: Darlene Hubbard is an 64 y.o. female who was admitted 06/02/2016 with a diagnosis of fracture patella and went to the operating room on 06/02/2016 and underwent the above named procedures.    Surgeries:Procedure(s): OPEN REDUCTION INTERNAL (ORIF) FIXATION PATELLA PATELLA TENDON REPAIR on 06/02/2016  PRE-OPERATIVE DIAGNOSIS: Fracture of the left patella  POST-OPERATIVE DIAGNOSIS:  Same  PROCEDURE:  Open reduction and internal fixation of the left patella  SURGEON:  Marciano Sequin. M.D.  ASSISTANT:  None  ANESTHESIA: general  ESTIMATED BLOOD LOSS: 25 mL  FLUIDS REPLACED: 1600 mL of crystalloid  TOURNIQUET TIME: 83 minutes  DRAINS: None  IMPLANTS UTILIZED: Arthrex 4.0 mm x 36 mm blunt tipped cannulated screws 2, Fibertape  INDICATIONS FOR SURGERY: Darlene Hubbard is a 65 y.o. year old female who fell and sustained a fracture of the left patella. After discussion of the risks and benefits of surgical intervention, the patient expressed understanding of the risks benefits and agree with plans for open reduction and internal fixation of the fracture.   The  risks, benefits, and alternatives were discussed at length including but not limited to the risks of infection, bleeding, nerve injury, stiffness, blood clots, the need for revision surgery, cardiopulmonary complications, among others, and they were willing to proceed. Patient tolerated the surgery well. No complications .Patient was taken to PACU where she was stabilized and then transferred to the orthopedic floor.  Patient started on Lovenox 30mg  q 12 hrs. Foot pumps applied bilaterally at 80 mm hgb. Heels elevated off bed with rolled towels. No evidence of DVT. Calves non tender. Negative Homan. Physical therapy started on day #1 for gait training and transfer with OT starting on  day #1 for ADL and assisted devices. Patient has done well with therapy. Ambulated 60 feet upon being discharged.  Patient's IV and foley were discontinued on day one  and hemovac was d/c on day #2.   She was given perioperative antibiotics:  Anti-infectives    Start     Dose/Rate Route Frequency Ordered Stop   06/02/16 2245  clindamycin (CLEOCIN) IVPB 600 mg     600 mg 100 mL/hr over 30 Minutes Intravenous Every 6 hours 06/02/16 2235 06/03/16 0601   06/02/16 1354  clindamycin (CLEOCIN) 900 MG/50ML IVPB    Comments:  Ronnell Freshwater: cabinet override      06/02/16 1354 06/02/16 1840   06/02/16 0600  clindamycin (CLEOCIN) IVPB 900 mg     900 mg 100 mL/hr over 30 Minutes Intravenous On call to O.R. 06/01/16 2259 06/02/16 1840    .  She was fitted with AV  1 compression foot pump devices, instructed in heel pumps, early ambulation, and TED stockings bilaterally for DVT prophylaxis.  She benefited maximally from the hospital stay and there were no complications.    Recent vital signs:  Vitals:   06/03/16 0110 06/03/16 0354  BP: 110/60 (!) 95/50  Pulse: 75 63  Resp: 18 18  Temp: 98 F (36.7 C) 98.3 F (36.8 C)    Recent laboratory studies:  Lab Results  Component Value Date   HGB 13.1 04/26/2013    Lab Results  Component Value Date   WBC 7.2 04/26/2013   PLT 241 04/26/2013   No results found for: INR Lab Results  Component Value Date   NA 139 04/26/2013   K 3.4 (L) 04/26/2013   CL 104 04/26/2013   CO2 30 04/26/2013   BUN 18 04/26/2013   CREATININE 0.54 06/03/2016   GLUCOSE 97 04/26/2013    Discharge Medications:     Medication List    STOP taking these medications   oxyCODONE-acetaminophen 7.5-325 MG tablet Commonly known as:  PERCOCET     TAKE these medications   ALPRAZolam 0.5 MG tablet Commonly known as:  XANAX   cetirizine 10 MG tablet Commonly known as:  ZYRTEC Take by mouth.   Fluticasone-Salmeterol 100-50 MCG/DOSE Aepb Commonly known as:  ADVAIR Inhale 1 puff into the lungs 2 (two) times daily.   gabapentin 300 MG capsule Commonly known as:  NEURONTIN Take 300 mg by mouth 2 (two) times daily.   montelukast 10 MG tablet Commonly known as:  SINGULAIR Take 10 mg by mouth daily.   MULTI-VITAMINS Tabs Take by mouth.   oxyCODONE 5 MG immediate release tablet Commonly known as:  Oxy IR/ROXICODONE Take 1-2 tablets (5-10 mg total) by mouth every 4 (four) hours as needed for breakthrough pain ((for MODERATE breakthrough pain)).   polyethylene glycol powder powder Commonly known as:  GLYCOLAX/MIRALAX 255 grams one bottle for colonoscopy prep   sertraline 50 MG tablet Commonly known as:  ZOLOFT Take by mouth.   traMADol 50 MG tablet Commonly known as:  ULTRAM Take 1-2 tablets (50-100 mg total) by mouth every 4 (four) hours as needed for moderate pain.   VITAMIN D-3 PO Take 2,000 Int'l Units by mouth.       Diagnostic Studies: Dg Knee 1-2 Views Left  Result Date: 06/02/2016 CLINICAL DATA:  ORIF left patella EXAM: LEFT KNEE - 1-2 VIEW; DG C-ARM 61-120 MIN COMPARISON:  05/31/2016 FINDINGS: Intraoperative fluoroscopy is obtained for surgical control purposes. Fluoroscopy time is recorded at 7 seconds. Dose area product is 26.7 micro Gy*meter  squared. Two spot fluoroscopic images are obtained. Spot fluoroscopic images obtained of the left knee demonstrate screw fixation of a transverse fracture of the body of the patella. Two screws are present. Fracture fragments demonstrate near-anatomic alignment and position. IMPRESSION: Intraoperative fluoroscopy utilized for surgical control purposes demonstrating screw fixation of the transverse fracture of the patella. Electronically Signed   By: Lucienne Capers M.D.   On: 06/02/2016 23:08   Dg Knee Complete 4 Views Left  Result Date: 05/31/2016 CLINICAL DATA:  Pain following fall EXAM: LEFT KNEE - COMPLETE 4+ VIEW COMPARISON:  None. FINDINGS: Frontal, lateral, and bilateral oblique views were obtained. There is a comminuted patellar fracture with several displaced mid and lower patellar fracture fragments. There is probable hematoma in the prepatellar region. There is a small joint effusion. No dislocation. There is narrowing medially. There is chondrocalcinosis. IMPRESSION: Comminuted fracture patella with probable hematoma in  the prepatellar soft tissue region. Several displaced patellar fracture fragments are noted in the mid and inferior patellar region. Small joint effusion. Joint space narrowing medially. Chondrocalcinosis is present representing either osteoarthritic change or calcium pyrophosphate deposition disease. Electronically Signed   By: Lowella Grip III M.D.   On: 05/31/2016 08:11   Dg C-arm 61-120 Min  Result Date: 06/02/2016 CLINICAL DATA:  ORIF left patella EXAM: LEFT KNEE - 1-2 VIEW; DG C-ARM 61-120 MIN COMPARISON:  05/31/2016 FINDINGS: Intraoperative fluoroscopy is obtained for surgical control purposes. Fluoroscopy time is recorded at 7 seconds. Dose area product is 26.7 micro Gy*meter squared. Two spot fluoroscopic images are obtained. Spot fluoroscopic images obtained of the left knee demonstrate screw fixation of a transverse fracture of the body of the patella. Two screws  are present. Fracture fragments demonstrate near-anatomic alignment and position. IMPRESSION: Intraoperative fluoroscopy utilized for surgical control purposes demonstrating screw fixation of the transverse fracture of the patella. Electronically Signed   By: Lucienne Capers M.D.   On: 06/02/2016 23:08    Disposition: 01-Home or Self Care       Signed: Pearce Littlefield R. 06/03/2016, 7:49 AM

## 2016-06-03 NOTE — Progress Notes (Signed)
Physical Therapy Treatment Patient Details Name: Darlene Hubbard MRN: [id number] DOB: 1951/06/14 Today's Date: 06/03/2016    History of Present Illness 65 y.o. female who was admitted with a Left Patellar Fracture with ORIF repair.    PT Comments    Pt shows good effort and ability to participate with PT this afternoon.  She is able to perform bed mobility and sit/stand transfers with good safety and confidence and was able to improve ambulation distance though she remains slow, hesitant and somewhat guarded.  She did not have a lot of pain with the session and is eager to participate despite being sleepy/tired.   Follow Up Recommendations  SNF     Equipment Recommendations       Recommendations for Other Services       Precautions / Restrictions Precautions Precautions: Fall;Knee Required Braces or Orthoses: Knee Immobilizer - Left Knee Immobilizer - Left: On at all times Restrictions LLE Weight Bearing: Weight bearing as tolerated    Mobility  Bed Mobility Overal bed mobility: Independent             General bed mobility comments: Pt is able to rise to EOB and return to supine w/o direct assist and good relative confidence  Transfers Overall transfer level: Modified independent Equipment used: Rolling walker (2 wheeled)             General transfer comment: Pt able to rise to standing with light UE use and maintains balance with good confidence.  Ambulation/Gait Ambulation/Gait assistance: Min guard Ambulation Distance (Feet): 60 Feet Assistive device: Rolling walker (2 wheeled)       General Gait Details: Pt does need regular reminders for walker use and foot advancement/placement but has no LOBs or safety issues.  She is very fatigued with the effort but overall did well.    Stairs            Wheelchair Mobility    Modified Rankin (Stroke Patients Only)       Balance Overall balance assessment: Modified Independent                                   Cognition Arousal/Alertness: Awake/alert Behavior During Therapy: WFL for tasks assessed/performed Overall Cognitive Status: Within Functional Limits for tasks assessed                      Exercises General Exercises - Lower Extremity Ankle Circles/Pumps: AROM;10 reps;Strengthening Quad Sets: Strengthening;10 reps Gluteal Sets: Strengthening;10 reps Hip ABduction/ADduction: 15 reps;Strengthening Straight Leg Raises: 15 reps;AROM    General Comments        Pertinent Vitals/Pain Pain Score: 3     Home Living                      Prior Function            PT Goals (current goals can now be found in the care plan section) Progress towards PT goals: Progressing toward goals    Frequency    BID      PT Plan Current plan remains appropriate    Co-evaluation             End of Session Equipment Utilized During Treatment: Gait belt;Left knee immobilizer Activity Tolerance: Patient tolerated treatment well Patient left: with bed alarm set;with call bell/phone within reach     Time: 1456-1520 PT Time Calculation (min) (ACUTE  ONLY): 24 min  Charges:  $Gait Training: 8-22 mins $Therapeutic Exercise: 8-22 mins                    G Codes:      Kreg Shropshire, DPT 06/03/2016, 4:00 PM

## 2016-06-03 NOTE — NC FL2 (Signed)
[place] LEVEL OF CARE SCREENING TOOL     IDENTIFICATION  Patient Name: Darlene Hubbard Birthdate: Feb 15, 1951 Sex: female Admission Date (Current Location): 06/02/2016  [place] and [place] Number:  Engineering geologist and Address:  [facility], [address], [place], [place] [address]      Provider Number: [id number]  Attending Physician Name and Address:  Dereck Leep, MD  Relative Name and Phone Number:       Current Level of Care: Hospital Recommended Level of Care: [place] Prior Approval Number:    Date Approved/Denied:   PASRR Number:  [id number]  Discharge Plan: SNF    Current Diagnoses: Patient Active Problem List   Diagnosis Date Noted  . Left patella fracture 06/03/2016  . Family history of colon cancer 12/12/2015    Orientation RESPIRATION BLADDER Height & Weight     Self, Time, Situation, Place  Normal Continent Weight: 177 lb (80.3 kg) Height:  5\' 4"  (162.6 cm)  BEHAVIORAL SYMPTOMS/MOOD NEUROLOGICAL BOWEL NUTRITION STATUS   (None. )  (None.) Continent Diet (Diet: Regular)  AMBULATORY STATUS COMMUNICATION OF NEEDS Skin   Extensive Assist Verbally Surgical wounds (Incision: Left Knee)                       Personal Care Assistance Level of Assistance  Bathing, Dressing, Feeding Bathing Assistance: Limited assistance Feeding assistance: Independent Dressing Assistance: Limited assistance     Functional Limitations Info  Sight, Hearing, Speech Sight Info: Adequate Hearing Info: Adequate Speech Info: Adequate    SPECIAL CARE FACTORS FREQUENCY  PT (By licensed PT), OT (By licensed OT)     PT Frequency:  (5) OT Frequency:  (5)            Contractures      Additional Factors Info  Code Status, Allergies (Full Code)   Allergies Info:  (Albuterol, Molds & Smuts, Penicillins, Diovan Valsartan, Sulfa Drugs Cross Reactors)           Current Medications (06/03/2016):   This is the current hospital active medication list Current Facility-Administered Medications  Medication Dose Route Frequency Provider Last Rate Last Dose  . 0.9 %  sodium chloride infusion   Intravenous Continuous Dereck Leep, MD 100 mL/hr at 06/02/16 2304    . acetaminophen (OFIRMEV) IV 1,000 mg  1,000 mg Intravenous Q6H Dereck Leep, MD   1,000 mg at 06/03/16 0531  . acetaminophen (TYLENOL) tablet 650 mg  650 mg Oral Q6H PRN Dereck Leep, MD       Or  . acetaminophen (TYLENOL) suppository 650 mg  650 mg Rectal Q6H PRN Dereck Leep, MD      . ALPRAZolam Duanne Moron) tablet 0.5 mg  0.5 mg Oral TID PRN Dereck Leep, MD   0.5 mg at 06/03/16 0010  . bisacodyl (DULCOLAX) suppository 10 mg  10 mg Rectal Daily PRN Dereck Leep, MD      . cholecalciferol (VITAMIN D) tablet 2,000 Units  2,000 Units Oral Daily Dereck Leep, MD   2,000 Units at 06/03/16 0910  . enoxaparin (LOVENOX) injection 40 mg  40 mg Subcutaneous Q24H Dereck Leep, MD   40 mg at 06/03/16 0910  . fentaNYL (SUBLIMAZE) 100 MCG/2ML injection           . ferrous sulfate tablet 325 mg  325 mg Oral BID WC Dereck Leep, MD   325 mg at 06/03/16 0910  .  gabapentin (NEURONTIN) capsule 300 mg  300 mg Oral BID Dereck Leep, MD   300 mg at 06/03/16 0911  . HYDROmorphone (DILAUDID) 1 MG/ML injection           . lactated ringers infusion   Intravenous Continuous Andria Frames, MD 50 mL/hr at 06/02/16 1413    . loratadine (CLARITIN) tablet 10 mg  10 mg Oral Daily Dereck Leep, MD   10 mg at 06/03/16 0910  . magnesium hydroxide (MILK OF MAGNESIA) suspension 30 mL  30 mL Oral Daily PRN Dereck Leep, MD      . menthol-cetylpyridinium (CEPACOL) lozenge 3 mg  1 lozenge Oral PRN Dereck Leep, MD       Or  . phenol (CHLORASEPTIC) mouth spray 1 spray  1 spray Mouth/Throat PRN Dereck Leep, MD      . metoCLOPramide (REGLAN) tablet 5-10 mg  5-10 mg Oral Q8H PRN Dereck Leep, MD       Or  . metoCLOPramide (REGLAN) injection  5-10 mg  5-10 mg Intravenous Q8H PRN Dereck Leep, MD   10 mg at 06/02/16 2311  . metoCLOPramide (REGLAN) tablet 10 mg  10 mg Oral TID AC & HS Dereck Leep, MD   10 mg at 06/03/16 0911  . mometasone-formoterol (DULERA) 100-5 MCG/ACT inhaler 2 puff  2 puff Inhalation BID Dereck Leep, MD   2 puff at 06/03/16 0912  . montelukast (SINGULAIR) tablet 10 mg  10 mg Oral Daily Dereck Leep, MD   10 mg at 06/03/16 0911  . morphine 2 MG/ML injection 2-4 mg  2-4 mg Intravenous Q4H PRN Dereck Leep, MD      . multivitamin with minerals tablet 1 tablet  1 tablet Oral Daily Dereck Leep, MD   1 tablet at 06/03/16 0910  . ondansetron (ZOFRAN) tablet 4 mg  4 mg Oral Q6H PRN Dereck Leep, MD       Or  . ondansetron (ZOFRAN) injection 4 mg  4 mg Intravenous Q6H PRN Dereck Leep, MD      . oxyCODONE (Oxy IR/ROXICODONE) immediate release tablet 5-10 mg  5-10 mg Oral Q4H PRN Dereck Leep, MD   10 mg at 06/03/16 0531  . pantoprazole (PROTONIX) EC tablet 40 mg  40 mg Oral BID Dereck Leep, MD   40 mg at 06/03/16 0911  . potassium chloride SA (K-DUR,KLOR-CON) CR tablet 20 mEq  20 mEq Oral TID Watt Climes, PA   20 mEq at 06/03/16 0910  . senna-docusate (Senokot-S) tablet 1 tablet  1 tablet Oral BID Dereck Leep, MD   1 tablet at 06/03/16 0910  . sertraline (ZOLOFT) tablet 50 mg  50 mg Oral Daily Dereck Leep, MD      . sodium phosphate (FLEET) 7-19 GM/118ML enema 1 enema  1 enema Rectal Once PRN Dereck Leep, MD      . traMADol Veatrice Bourbon) tablet 50-100 mg  50-100 mg Oral Q4H PRN Dereck Leep, MD   50 mg at 06/03/16 0057     Discharge Medications: Please see discharge summary for a list of discharge medications.  Relevant Imaging Results:  Relevant Lab Results:   Additional Information  (SSN: [id number])  Danie Chandler, Student-Social Work

## 2016-06-03 NOTE — Progress Notes (Cosign Needed)
Social work Theatre manager met with patient at bedside and gave bed offers. Patient chose [facility] for short-term rehab. [place] admission coordinator at [facility] is aware of above. Social work Theatre manager will continue to assist as needed.   [place] [phone]

## 2016-06-03 NOTE — Progress Notes (Signed)
   Subjective: 1 Day Post-Op Procedure(s) (LRB): OPEN REDUCTION INTERNAL (ORIF) FIXATION PATELLA (Left) PATELLA TENDON REPAIR (Left) Patient reports pain as 5 on 0-10 scale.   Patient is well, and has had no acute complaints or problems We will start therapy today.  Plan is to go Rehab after hospital stay. no nausea and no vomiting Patient denies any chest pains or shortness of breath. Objective: Vital signs in last 24 hours: Temp:  [97.9 F (36.6 C)-99.2 F (37.3 C)] 98.3 F (36.8 C) (10/05 0354) Pulse Rate:  [63-123] 63 (10/05 0354) Resp:  [11-29] 18 (10/05 0354) BP: (95-152)/(50-81) 95/50 (10/05 0354) SpO2:  [91 %-100 %] 97 % (10/05 0354) Weight:  [80.3 kg (177 lb)] 80.3 kg (177 lb) (10/04 1410) Heels are non tender and elevated off the bed using rolled towels Intake/Output from previous day: 10/04 0701 - 10/05 0700 In: 2800 [I.V.:1800; IV Piggyback:1000] Out: 900 [Urine:850; Blood:50] Intake/Output this shift: No intake/output data recorded.  No results for input(s): HGB in the last 72 hours. No results for input(s): WBC, RBC, HCT, PLT in the last 72 hours.  Recent Labs  06/03/16 0338  CREATININE 0.54   No results for input(s): LABPT, INR in the last 72 hours.  EXAM General - Patient is Alert, Appropriate and Oriented Extremity - Neurologically intact Neurovascular intact Sensation intact distally Intact pulses distally Dorsiflexion/Plantar flexion intact Compartment soft Dressing - dressing C/D/I Motor Function - intact, moving foot and toes well on exam.    Past Medical History:  Diagnosis Date  . Allergy   . Asthma   . Bursitis    BOTH SHOULDER AND HIPS  . Fibromyalgia   . GERD (gastroesophageal reflux disease)   . Hiatal hernia   . IBS (irritable bowel syndrome)   . Mild obstructive sleep apnea   . Morbid obesity ([place])   . Spinal stenosis   . Varicose veins     Assessment/Plan: 1 Day Post-Op Procedure(s) (LRB): OPEN REDUCTION INTERNAL  (ORIF) FIXATION PATELLA (Left) PATELLA TENDON REPAIR (Left) Active Problems:   Left patella fracture  Estimated body mass index is 30.38 kg/m as calculated from the following:   Height as of this encounter: 5\' 4"  (1.626 m).   Weight as of this encounter: 80.3 kg (177 lb). Advance diet Up with therapy D/C IV fluids Plan for discharge tomorrow Discharge to SNF  Labs: reviewed DVT Prophylaxis - Lovenox, Foot Pumps and TED hose Weight-Bearing as tolerated to left leg D/C O2 and Pulse OX and try on Room Air Begin working on a bowel movement Labs in am  [employer]. [place] 06/03/2016, 7:17 AM

## 2016-06-03 NOTE — Clinical Social Work Note (Signed)
Clinical Social Work Assessment  Patient Details  Name: Darlene Hubbard MRN: [id number] Date of Birth: May 16, 1951  Date of referral:  06/03/16               Reason for consult:  Discharge Planning, Facility Placement                Permission sought to share information with:  Chartered certified accountant granted to share information::  Yes, Verbal Permission Granted  Name::      [place]::     Relationship::     Contact Information:     Housing/Transportation Living arrangements for the past 2 months:  [place] of Information:  Patient Patient Interpreter Needed:  None Criminal Activity/Legal Involvement Pertinent to Current Situation/Hospitalization:  No - Comment as needed Significant Relationships:  Adult Children Lives with:  Self Do you feel safe going back to the place where you live?  Yes Need for family participation in patient care:  Yes (Comment)  Care giving concerns:  Patient lives by herself in [place].    Social Worker assessment / plan:  Social work Theatre manager received social work consult. PT is recommending SNF. Social work Theatre manager met with patient at beside. Social work Theatre manager explained the role of social work. Per patient, she lives alone in [place]. 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 Patient has two sons, Angelica Chessman [phone] and Saralyn Pilar [phone] that live in [place], [place]. Patient has a daughter-in-law, Inocente Salles, [phone] that lives in [place]. Patient has a step-daughter, Levada Dy, [phone], that lives in [place] and is also a Education officer, museum. Patient requested that we call daughter-in-law Sam if any medical concerns arise, as she will the easiest to reach. Son Angelica Chessman is patients HPOA. Social work Theatre manager explained to patient that PT was recommending SNF for short-term rehab. Patient was agreeable to recommendations. Patient did not have a preference of  a SNF at the time, but did mention her late husband has been to both [facility] and [facility] for rehab in the past and would consider going to either one of those facilities. Social work Theatre manager left SNF list with patient. Social work Theatre manager verified that patients insurance of [facility] Medicare and patient confirmed that was correct.   Fl2 completed and faxed out.   Employment status:  Animator Sales promotion account executive ) Insurance information:  Managed Medicare PT Recommendations:  [place] / Referral to community resources:  [place]  Patient/Family's Response to care: Patient was agreeable to going to SNF for short-term rehab.   Patient/Family's Understanding of and Emotional Response to Diagnosis, Current Treatment, and Prognosis:  Patient was pleasant and thanked social work Theatre manager for coming by.   Emotional Assessment Appearance:  Appears stated age Attitude/Demeanor/Rapport:    Affect (typically observed):  Accepting, Pleasant, Adaptable Orientation:  Oriented to Self, Oriented to Place, Oriented to  Time, Oriented to Situation Alcohol / Substance use:  Not Applicable Psych involvement (Current and /or in the community):  No (Comment)  Discharge Needs  Concerns to be addressed:  Basic Needs, Discharge Planning Concerns Readmission within the last 30 days:  No Current discharge risk:  None Barriers to Discharge:  Continued Medical Work up   [employer], [place] Work 06/03/2016, 9:41 AM

## 2016-06-03 NOTE — Evaluation (Signed)
Occupational Therapy Evaluation Patient Details Name: Darlene Hubbard MRN: [id number] DOB: 1951-06-20 Today's Date: 06/03/2016    History of Present Illness Pt. is a 65 y.o. female who was admitted with a Left Patellar Fracture with ORIF repair.   Clinical Impression   Pt. Is a 65 y.o. Female who was admitted with a Left Patellar Fracture with ORIF repair. Pt. Presents with 2/10 pain, weakness, and knee immobilizer in place at all times which hinders her ability to complete ADL and IADL tasks. Pt. could benefit from OT skilled services for ADL training, A/E training, UE there. Ex, functional mobility, and for education in home modifications, and DME. Pt. Plans to go to SNF upon discharge.   Follow Up Recommendations  SNF   Equipment Recommendations       Recommendations for Other Services       Precautions / Restrictions Precautions Precautions: Fall;Knee Required Braces or Orthoses: Knee Immobilizer - Left Knee Immobilizer - Left: On at all times Restrictions Weight Bearing Restrictions: Yes LLE Weight Bearing: Weight bearing as tolerated              ADL Overall ADL's : Needs assistance/impaired Eating/Feeding: Set up   Grooming: Set up               Lower Body Dressing: Maximal assistance (Left LE knee immobilizer in place.)                 General ADL Comments: Pt. education was provided about A/E use for LE ADLs.     Vision     Perception     Praxis      Pertinent Vitals/Pain Pain Assessment: 0-10 Pain Score: 2      Hand Dominance Right   Extremity/Trunk Assessment Upper Extremity Assessment Upper Extremity Assessment: Overall WFL for tasks assessed     Communication Communication Communication: No difficulties   Cognition Arousal/Alertness: Awake/alert Behavior During Therapy: WFL for tasks assessed/performed Overall Cognitive Status: Within Functional Limits for tasks assessed                     General  Comments       Exercises Exercises: General Lower Extremity     Shoulder Instructions      Home Living Family/patient expects to be discharged to:: Skilled nursing facility                                 Additional Comments: Pt. resides home alone, and does not have family.      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, able to do all errands, driving, etcw/o issue        OT Problem List: Decreased strength;Pain;Decreased knowledge of use of DME or AE   OT Treatment/Interventions: Self-care/ADL training;Therapeutic exercise;Patient/family education;Therapeutic activities    OT Goals(Current goals can be found in the care plan section) Acute Rehab OT Goals Patient Stated Goal: To regain independence OT Goal Formulation: With patient Potential to Achieve Goals: Good  OT Frequency: Min 1X/week   Barriers to D/C:            Co-evaluation              End of Session    Activity Tolerance: Patient tolerated treatment well Patient left:  In chair, with alarm, and call bell in place.   TimeAS:[id number] OT  Time Calculation (min): 30 min Charges:  OT General Charges $OT Visit: 1 Procedure OT Evaluation $OT Eval Moderate Complexity: 1 Procedure OT Treatments $Self Care/Home Management : 8-22 mins G-Codes:    Har[place] Carina 09-Jun-2016, 10:16 AM

## 2016-06-03 NOTE — Clinical Social Work Placement (Signed)
   CLINICAL SOCIAL WORK PLACEMENT  NOTE  Date:  06/03/2016  Patient Details  Name: Darlene Hubbard MRN: [id number] Date of Birth: 05/15/1951  Clinical Social Work is seeking post-discharge placement for this patient at the [place] level of care (*CSW will initial, date and re-position this form in  chart as items are completed):  Yes   Patient/family provided with [facility] Work Department's list of facilities offering this level of care within the geographic area requested by the patient (or if unable, by the patient's family).  Yes   Patient/family informed of their freedom to choose among providers that offer the needed level of care, that participate in Medicare, Medicaid or managed care program needed by the patient, have an available bed and are willing to accept the patient.  Yes   Patient/family informed of [facility]'s ownership interest in [facility] and [facility], as well as of the fact that they are under no obligation to receive care at these facilities.  PASRR submitted to EDS on 06/03/16     PASRR number received on 06/03/16     Existing PASRR number confirmed on       FL2 transmitted to all facilities in geographic area requested by pt/family on 06/03/16     FL2 transmitted to all facilities within larger geographic area on       Patient informed that his/her managed care company has contracts with or will negotiate with certain facilities, including the following:            Patient/family informed of bed offers received.  Patient chooses bed at       Physician recommends and patient chooses bed at      Patient to be transferred to   on  .  Patient to be transferred to facility by       Patient family notified on   of transfer.  Name of family member notified:        PHYSICIAN       Additional Comment:    _______________________________________________ Kaliyah Gladman, Veronia Beets, LCSW 06/03/2016, 9:19 AM

## 2016-06-04 ENCOUNTER — Encounter
Admission: RE | Admit: 2016-06-04 | Discharge: 2016-06-04 | Disposition: A | Discharge status: Home / Self Care | Payer: Medicare Other | Source: Ambulatory Visit | Attending: Internal Medicine | Admitting: Internal Medicine

## 2016-06-04 DIAGNOSIS — R339 Retention of urine, unspecified: Secondary | ICD-10-CM | POA: Insufficient documentation

## 2016-06-04 MED ORDER — OXYCODONE HCL 5 MG PO TABS: 5.0000 mg | ORAL_TABLET | ORAL | 0 refills | Status: DC | PRN

## 2016-06-04 MED ORDER — TRAMADOL HCL 50 MG PO TABS: 50.0000 mg | ORAL_TABLET | ORAL | 1 refills | Status: DC | PRN

## 2016-06-04 NOTE — Progress Notes (Signed)
Physical Therapy Treatment Patient Details Name: Darlene Hubbard MRN: [id number] DOB: 04/26/1951 Today's Date: 06/04/2016    History of Present Illness 65 y.o. female who was admitted with a Left Patellar Fracture with ORIF repair.    PT Comments    Pt reports increased pain over the evening, but it is still moderate with 4-6/10 pain t/o session.  She was able to perform bed exercises well, and increased ambulation distance and confidence.  She reports she is eager to keep working with PT and to eventually start doing some ROM/mobility with the knee.   Follow Up Recommendations  SNF     Equipment Recommendations       Recommendations for Other Services       Precautions / Restrictions Precautions Precautions: Fall;Knee Required Braces or Orthoses: Knee Immobilizer - Left Knee Immobilizer - Left: On at all times Restrictions Weight Bearing Restrictions: Yes LLE Weight Bearing: Weight bearing as tolerated    Mobility  Bed Mobility Overal bed mobility: Independent             General bed mobility comments: Pt is able to rise to EOB and return to supine w/o direct assist and good relative confidence  Transfers Overall transfer level: Modified independent Equipment used: Rolling walker (2 wheeled)             General transfer comment: Pt able to rise to standing with light UE use and maintains balance with good confidence.  Ambulation/Gait Ambulation/Gait assistance: Supervision Ambulation Distance (Feet): 75 Feet Assistive device: Rolling walker (2 wheeled)       General Gait Details: Pt shows good and relatively consistent cadence with ambulation.  She has no LOBs or safety concerns and though she is fatigued with the effort she ultimately did well.    Stairs            Wheelchair Mobility    Modified Rankin (Stroke Patients Only)       Balance Overall balance assessment: Modified Independent                                   Cognition Arousal/Alertness: Awake/alert Behavior During Therapy: WFL for tasks assessed/performed Overall Cognitive Status: Within Functional Limits for tasks assessed                      Exercises General Exercises - Lower Extremity Ankle Circles/Pumps: 15 reps;Strengthening Quad Sets: Strengthening;15 reps Gluteal Sets: Strengthening;15 reps Hip ABduction/ADduction: Strengthening;15 reps Straight Leg Raises: AROM;15 reps    General Comments        Pertinent Vitals/Pain Pain Score: 5     Home Living                      Prior Function            PT Goals (current goals can now be found in the care plan section) Progress towards PT goals: Progressing toward goals    Frequency    BID      PT Plan Current plan remains appropriate    Co-evaluation             End of Session Equipment Utilized During Treatment: Gait belt;Left knee immobilizer Activity Tolerance: Patient tolerated treatment well Patient left: with call bell/phone within reach;with chair alarm set     Time: [id number] PT Time Calculation (min) (ACUTE ONLY): 26 min  Charges:  $Gait Training:  8-22 mins $Therapeutic Exercise: 8-22 mins                    G Codes:      Kreg Shropshire, DPT 06/04/2016, 10:32 AM

## 2016-06-04 NOTE — Progress Notes (Signed)
   Subjective: 2 Days Post-Op Procedure(s) (LRB): OPEN REDUCTION INTERNAL (ORIF) FIXATION PATELLA (Left) PATELLA TENDON REPAIR (Left) Patient reports pain as 5 on 0-10 scale.   Patient is well, and has had no acute complaints or problems Continue with  therapy today.  Plan is to go Rehab after hospital stay. no nausea and no vomiting Patient denies any chest pains or shortness of breath. Objective: Vital signs in last 24 hours: Temp:  [97.7 F (36.5 C)-99.3 F (37.4 C)] 99.3 F (37.4 C) (10/06 0731) Pulse Rate:  [66-105] 105 (10/06 0731) Resp:  [18-19] 18 (10/06 0731) BP: (90-160)/(52-76) 135/69 (10/06 0731) SpO2:  [90 %-100 %] 90 % (10/06 0731) well approximated incision Heels are non tender and elevated off the bed using rolled towels Intake/Output from previous day: 10/05 0701 - 10/06 0700 In: [id number] [P.O.:720; I.V.:900; IV Piggyback:100] Out: 325 [Urine:325] Intake/Output this shift: No intake/output data recorded.  No results for input(s): HGB in the last 72 hours. No results for input(s): WBC, RBC, HCT, PLT in the last 72 hours.  Recent Labs  06/03/16 0338  CREATININE 0.54   No results for input(s): LABPT, INR in the last 72 hours.  EXAM General - Patient is Alert, Appropriate and Oriented Extremity - Neurologically intact Neurovascular intact Sensation intact distally Intact pulses distally Dorsiflexion/Plantar flexion intact No cellulitis present Compartment soft severe brusing to the entire knee. very tender along the medical femoral chondlye  Dressing - moderate drainage Motor Function - intact, moving foot and toes well on exam.    Past Medical History:  Diagnosis Date  . Allergy   . Asthma   . Bursitis    BOTH SHOULDER AND HIPS  . Fibromyalgia   . GERD (gastroesophageal reflux disease)   . Hiatal hernia   . IBS (irritable bowel syndrome)   . Mild obstructive sleep apnea   . Morbid obesity ([place])   . Spinal stenosis   . Varicose veins      Assessment/Plan: 2 Days Post-Op Procedure(s) (LRB): OPEN REDUCTION INTERNAL (ORIF) FIXATION PATELLA (Left) PATELLA TENDON REPAIR (Left) Active Problems:   Left patella fracture  Estimated body mass index is 30.38 kg/m as calculated from the following:   Height as of this encounter: 5\' 4"  (1.626 m).   Weight as of this encounter: 80.3 kg (177 lb). Up with therapy Discharge to SNF  Labs: reviewed DVT Prophylaxis - Lovenox, Foot Pumps and TED hose Weight-Bearing as tolerated to left leg Dressing changed today.  Jillyn Ledger. [place] 06/04/2016, 8:03 AM

## 2016-06-04 NOTE — Clinical Social Work Placement (Signed)
   CLINICAL SOCIAL WORK PLACEMENT  NOTE  Date:  06/04/2016  Patient Details  Name: Darlene Hubbard MRN: [id number] Date of Birth: 10-Nov-1950  Clinical Social Work is seeking post-discharge placement for this patient at the [place] level of care (*CSW will initial, date and re-position this form in  chart as items are completed):  Yes   Patient/family provided with [place] Work Department's list of facilities offering this level of care within the geographic area requested by the patient (or if unable, by the patient's family).  Yes   Patient/family informed of their freedom to choose among providers that offer the needed level of care, that participate in Medicare, Medicaid or managed care program needed by the patient, have an available bed and are willing to accept the patient.  Yes   Patient/family informed of [facility]'s ownership interest in [facility] and [facility], as well as of the fact that they are under no obligation to receive care at these facilities.  PASRR submitted to EDS on 06/03/16     PASRR number received on 06/03/16     Existing PASRR number confirmed on       FL2 transmitted to all facilities in geographic area requested by pt/family on 06/03/16     FL2 transmitted to all facilities within larger geographic area on       Patient informed that his/her managed care company has contracts with or will negotiate with certain facilities, including the following:        Yes   Patient/family informed of bed offers received.  Patient chooses bed at  [facility])     Physician recommends and patient chooses bed at      Patient to be transferred to  [facility]) on 06/04/16.  Patient to be transferred to facility by       Patient family notified on 06/04/16 of transfer.  Name of family member notified:   (Son)     PHYSICIAN       Additional Comment:    _______________________________________________ Baldemar Lenis, LCSW 06/04/2016, 9:34 AM

## 2016-06-04 NOTE — Progress Notes (Signed)
Called report to [place] at [facility]. Patient going to room 221. EMS called, patient's belongings packed, IV removed.

## 2016-06-04 NOTE — Progress Notes (Addendum)
Clinical Social Worker was informed that patient will be medically ready to discharge to [place]. Patient and family are in a agreement with plan. CSW called Sharyn Lull- Admissions Coordinator at [facility] to confirm that patient's bed is ready. Provided patient's room number 221 and number to call for report [phone] . All discharge information faxed to [facility] via [facility].    Call to patient's daughter- Levada Dy. The lady that answered the phone informed CSW she had the wrong number. Called patient's son left message to inform him patient would discharge to [facility]. RN will call report and patient will discharge to [facility] via EMS.  Ernest Pine, MSW, LCSW, [place] Clinical Social Worker [phone]

## 2016-06-05 DIAGNOSIS — R339 Retention of urine, unspecified: Secondary | ICD-10-CM | POA: Diagnosis present

## 2016-06-05 LAB — URINALYSIS COMPLETE WITH MICROSCOPIC (ARMC ONLY)
Bacteria, UA: NONE SEEN
Bilirubin Urine: NEGATIVE
Glucose, UA: NEGATIVE mg/dL
Ketones, ur: NEGATIVE mg/dL
Leukocytes, UA: NEGATIVE
Nitrite: NEGATIVE
Protein, ur: NEGATIVE mg/dL
Specific Gravity, Urine: 1.008 (ref 1.005–1.030)
pH: 8 (ref 5.0–8.0)

## 2016-06-06 LAB — URINE CULTURE: Culture: <10000 — AB

## 2016-06-07 DIAGNOSIS — R339 Retention of urine, unspecified: Secondary | ICD-10-CM | POA: Diagnosis not present

## 2016-06-07 LAB — URINALYSIS COMPLETE WITH MICROSCOPIC (ARMC ONLY)
Bacteria, UA: NONE SEEN
Bilirubin Urine: NEGATIVE
Glucose, UA: NEGATIVE mg/dL
Ketones, ur: NEGATIVE mg/dL
Leukocytes, UA: NEGATIVE
Nitrite: NEGATIVE
Protein, ur: 30 mg/dL — AB
Specific Gravity, Urine: 1.019 (ref 1.005–1.030)
pH: 6 (ref 5.0–8.0)

## 2016-06-08 ENCOUNTER — Other Ambulatory Visit (INDEPENDENT_AMBULATORY_CARE_PROVIDER_SITE_OTHER): Payer: Self-pay | Admitting: Vascular Surgery

## 2016-06-08 DIAGNOSIS — I8311 Varicose veins of right lower extremity with inflammation: Secondary | ICD-10-CM

## 2016-06-08 DIAGNOSIS — I8312 Varicose veins of left lower extremity with inflammation: Principal | ICD-10-CM

## 2016-06-08 DIAGNOSIS — M7989 Other specified soft tissue disorders: Secondary | ICD-10-CM

## 2016-06-08 LAB — URINE CULTURE

## 2016-06-09 ENCOUNTER — Encounter (INDEPENDENT_AMBULATORY_CARE_PROVIDER_SITE_OTHER): Payer: Medicare Other

## 2016-06-09 ENCOUNTER — Ambulatory Visit (INDEPENDENT_AMBULATORY_CARE_PROVIDER_SITE_OTHER): Payer: Medicare Other | Admitting: Vascular Surgery

## 2016-06-10 DIAGNOSIS — R339 Retention of urine, unspecified: Secondary | ICD-10-CM | POA: Diagnosis not present

## 2016-06-10 LAB — URINALYSIS COMPLETE WITH MICROSCOPIC (ARMC ONLY)
Bacteria, UA: NONE SEEN
Bilirubin Urine: NEGATIVE
Glucose, UA: NEGATIVE mg/dL
Ketones, ur: NEGATIVE mg/dL
Leukocytes, UA: NEGATIVE
Nitrite: NEGATIVE
Protein, ur: NEGATIVE mg/dL
Specific Gravity, Urine: 1.006 (ref 1.005–1.030)
pH: 8 (ref 5.0–8.0)

## 2016-06-11 LAB — URINE CULTURE

## 2016-06-13 DIAGNOSIS — R339 Retention of urine, unspecified: Secondary | ICD-10-CM | POA: Diagnosis not present

## 2016-06-13 LAB — URINALYSIS COMPLETE WITH MICROSCOPIC (ARMC ONLY)
Bilirubin Urine: NEGATIVE
Glucose, UA: NEGATIVE mg/dL
Ketones, ur: NEGATIVE mg/dL
Leukocytes, UA: NEGATIVE
Nitrite: NEGATIVE
Protein, ur: NEGATIVE mg/dL
Specific Gravity, Urine: 1.015 (ref 1.005–1.030)
pH: 5 (ref 5.0–8.0)

## 2016-06-15 LAB — URINE CULTURE

## 2016-06-26 ENCOUNTER — Emergency Department
Admission: EM | Admit: 2016-06-26 | Discharge: 2016-06-27 | Disposition: A | Discharge status: Home / Self Care | Payer: Medicare Other | Attending: Emergency Medicine | Admitting: Emergency Medicine

## 2016-06-26 DIAGNOSIS — Z79899 Other long term (current) drug therapy: Secondary | ICD-10-CM | POA: Insufficient documentation

## 2016-06-26 DIAGNOSIS — K529 Noninfective gastroenteritis and colitis, unspecified: Secondary | ICD-10-CM | POA: Insufficient documentation

## 2016-06-26 DIAGNOSIS — J45909 Unspecified asthma, uncomplicated: Secondary | ICD-10-CM | POA: Diagnosis not present

## 2016-06-26 DIAGNOSIS — R112 Nausea with vomiting, unspecified: Secondary | ICD-10-CM | POA: Diagnosis present

## 2016-06-26 LAB — CBC
HCT: 41.1 % (ref 35.0–47.0)
Hemoglobin: 14 g/dL (ref 12.0–16.0)
MCH: 29.5 pg (ref 26.0–34.0)
MCHC: 34.1 g/dL (ref 32.0–36.0)
MCV: 86.7 fL (ref 80.0–100.0)
Platelets: 255 10*3/uL (ref 150–440)
RBC: 4.74 MIL/uL (ref 3.80–5.20)
RDW: 14.3 % (ref 11.5–14.5)
WBC: 14.9 10*3/uL — ABNORMAL HIGH (ref 3.6–11.0)

## 2016-06-26 LAB — LIPASE, BLOOD: Lipase: 23 U/L (ref 11–51)

## 2016-06-26 LAB — COMPREHENSIVE METABOLIC PANEL
ALT: 18 U/L (ref 14–54)
AST: 20 U/L (ref 15–41)
Albumin: 3.6 g/dL (ref 3.5–5.0)
Alkaline Phosphatase: 87 U/L (ref 38–126)
Anion gap: 10 (ref 5–15)
BUN: 16 mg/dL (ref 6–20)
CO2: 24 mmol/L (ref 22–32)
Calcium: 9.2 mg/dL (ref 8.9–10.3)
Chloride: 106 mmol/L (ref 101–111)
Creatinine, Ser: 0.82 mg/dL (ref 0.44–1.00)
GFR calc Af Amer: >60 mL/min (ref >60)
GFR calc non Af Amer: >60 mL/min (ref >60)
Glucose, Bld: 135 mg/dL — ABNORMAL HIGH (ref 65–99)
Potassium: 4 mmol/L (ref 3.5–5.1)
Sodium: 140 mmol/L (ref 135–145)
Total Bilirubin: 0.8 mg/dL (ref 0.3–1.2)
Total Protein: 6.7 g/dL (ref 6.5–8.1)

## 2016-06-26 MED ORDER — ONDANSETRON HCL 4 MG/2ML IJ SOLN
INTRAMUSCULAR | Status: AC
  Administered 2016-06-26: 4 mg via INTRAVENOUS
  Filled 2016-06-26: qty 2

## 2016-06-26 MED ORDER — ONDANSETRON HCL 4 MG/2ML IJ SOLN
4.0000 mg | Freq: Once | INTRAMUSCULAR | Status: AC
  Administered 2016-06-26: 4 mg via INTRAVENOUS

## 2016-06-26 NOTE — ED Triage Notes (Signed)
Pt c/o nausea and vomiting X 12 times today with RLQ abdominal pain. Pt denies diarrhea. Pt alert and oriented X4, active, cooperative, pt in NAD. RR even and unlabored, color WNL.

## 2016-06-27 LAB — URINALYSIS COMPLETE WITH MICROSCOPIC (ARMC ONLY)
Bacteria, UA: NONE SEEN
Bilirubin Urine: NEGATIVE
Glucose, UA: NEGATIVE mg/dL
Leukocytes, UA: NEGATIVE
Nitrite: NEGATIVE
Protein, ur: 30 mg/dL — AB
Specific Gravity, Urine: 1.023 (ref 1.005–1.030)
pH: 5 (ref 5.0–8.0)

## 2016-06-27 MED ORDER — DICYCLOMINE HCL 20 MG PO TABS: 20.0000 mg | ORAL_TABLET | Freq: Three times a day (TID) | ORAL | 0 refills | Status: DC | PRN

## 2016-06-27 MED ORDER — MORPHINE SULFATE (PF) 2 MG/ML IV SOLN
2.0000 mg | Freq: Once | INTRAVENOUS | Status: AC
Start: 2016-06-27 — End: 2016-06-27
  Administered 2016-06-27: 2 mg via INTRAVENOUS
  Filled 2016-06-27: qty 1

## 2016-06-27 MED ORDER — DICYCLOMINE HCL 10 MG/ML IM SOLN
20.0000 mg | Freq: Once | INTRAMUSCULAR | Status: AC
  Administered 2016-06-27: 20 mg via INTRAMUSCULAR
  Filled 2016-06-27: qty 2

## 2016-06-27 MED ORDER — ONDANSETRON HCL 4 MG PO TABS: 4.0000 mg | ORAL_TABLET | Freq: Three times a day (TID) | ORAL | 0 refills | Status: DC | PRN

## 2016-06-27 MED ORDER — ONDANSETRON HCL 4 MG/2ML IJ SOLN
4.0000 mg | Freq: Once | INTRAMUSCULAR | Status: AC
  Administered 2016-06-27: 4 mg via INTRAVENOUS
  Filled 2016-06-27: qty 2

## 2016-06-27 MED ORDER — SODIUM CHLORIDE 0.9 % IV BOLUS (SEPSIS)
1000.0000 mL | Freq: Once | INTRAVENOUS | Status: AC
  Administered 2016-06-27: 1000 mL via INTRAVENOUS

## 2016-06-27 NOTE — ED Notes (Signed)
Dr Goodman in to follow up 

## 2016-06-27 NOTE — Discharge Instructions (Signed)
Please seek medical attention for any high fevers, chest pain, shortness of breath, change in behavior, persistent vomiting, bloody stool or any other new or concerning symptoms.  

## 2016-06-27 NOTE — ED Notes (Signed)
MD at bedside. 

## 2016-06-27 NOTE — ED Provider Notes (Signed)
[facility] Emergency Department Provider Note   ____________________________________________   I have reviewed the triage vital signs and the nursing notes.   HISTORY  Chief Complaint Emesis and Abdominal Pain   History limited by: Not Limited   HPI Darlene Hubbard is a 65 y.o. female who presents to the emergency department today because of concern for nausea, vomiting and right lower quadrant pain. The patient states that she started having some abdominal discomfort yesterday. Additionally she was having a couple of loose stools. The patient then started developing vomiting today. Multiple times. Non bloody. The patient's abdominal pain was located in the right lower quadrant. Status post cholecystectomy and appendectomy. No fevers.    Past Medical History:  Diagnosis Date  . Allergy   . Asthma   . Bursitis    BOTH SHOULDER AND HIPS  . Fibromyalgia   . GERD (gastroesophageal reflux disease)   . Hiatal hernia   . IBS (irritable bowel syndrome)   . Mild obstructive sleep apnea   . Morbid obesity ([place])   . Spinal stenosis   . Varicose veins     Patient Active Problem List   Diagnosis Date Noted  . Left patella fracture 06/03/2016  . Family history of colon cancer 12/12/2015    Past Surgical History:  Procedure Laterality Date  . ABDOMINAL HYSTERECTOMY  1993  . APPENDECTOMY  1995  . BACK SURGERY    . BREAST BIOPSY Left 2000   neg  . CHOLECYSTECTOMY    . COLONOSCOPY  2008  . COLONOSCOPY WITH PROPOFOL N/A 02/18/2016   Procedure: COLONOSCOPY WITH PROPOFOL;  Surgeon: [name], MD;  Location: [facility] ENDOSCOPY;  Service: Endoscopy;  Laterality: N/A;  . ORIF PATELLA Left 06/02/2016   Procedure: OPEN REDUCTION INTERNAL (ORIF) FIXATION PATELLA;  Surgeon: Dereck Leep, MD;  Location: [facility] ORS;  Service: Orthopedics;  Laterality: Left;  . PARTIAL HYSTERECTOMY    . PATELLAR TENDON REPAIR Left 06/02/2016   Procedure: PATELLA TENDON REPAIR;   Surgeon: Dereck Leep, MD;  Location: [facility] ORS;  Service: Orthopedics;  Laterality: Left;  . TUBAL LIGATION    . UPPER GI ENDOSCOPY  2008  . vein closure procedure Bilateral 2010    Prior to Admission medications   Medication Sig Start Date End Date Taking? Authorizing Provider  ALPRAZolam Duanne Moron) 0.5 MG tablet  11/03/15   Historical Provider, MD  cetirizine (ZYRTEC) 10 MG tablet Take by mouth.    Historical Provider, MD  Cholecalciferol (VITAMIN D-3 PO) Take 2,000 Int'l Units by mouth.     Historical Provider, MD  Fluticasone-Salmeterol (ADVAIR) 100-50 MCG/DOSE AEPB Inhale 1 puff into the lungs 2 (two) times daily.    Historical Provider, MD  gabapentin (NEURONTIN) 300 MG capsule Take 300 mg by mouth 2 (two) times daily.  05/19/15   Historical Provider, MD  montelukast (SINGULAIR) 10 MG tablet Take 10 mg by mouth daily.    Historical Provider, MD  Multiple Vitamin (MULTI-VITAMINS) TABS Take by mouth.    Historical Provider, MD  oxyCODONE (OXY IR/ROXICODONE) 5 MG immediate release tablet Take 1-2 tablets (5-10 mg total) by mouth every 4 (four) hours as needed for breakthrough pain ((for MODERATE breakthrough pain)). 06/04/16   Watt Climes, PA  polyethylene glycol powder [facility]) powder 255 grams one bottle for colonoscopy prep Patient not taking: Reported on 06/02/2016 12/11/15   [name], MD  sertraline (ZOLOFT) 50 MG tablet Take by mouth.    Historical Provider, MD  traMADol (  ULTRAM) 50 MG tablet Take 1-2 tablets (50-100 mg total) by mouth every 4 (four) hours as needed for moderate pain. 06/04/16   Watt Climes, PA    Allergies Albuterol; Molds & smuts; Penicillins; Diovan [valsartan]; and Sulfa drugs cross reactors  Family History  Problem Relation Age of Onset  . Colon cancer Father 72  . Thrombocytopenia Mother 67  . Other Sister     Bone Marrow disorder    Social History Social History  Substance Use Topics  . Smoking status: Never Smoker  . Smokeless tobacco:  Never Used  . Alcohol use No    Review of Systems  Constitutional: Negative for fever. Cardiovascular: Negative for chest pain. Respiratory: Negative for shortness of breath. Gastrointestinal: Positive for abdominal pain. Genitourinary: Negative for dysuria. Musculoskeletal: Negative for back pain. Skin: Negative for rash. Neurological: Negative for headaches, focal weakness or numbness.  10-point ROS otherwise negative.  ____________________________________________   PHYSICAL EXAM:  VITAL SIGNS: ED Triage Vitals  Enc Vitals Group     BP 06/26/16 2318 (!) 143/74     Pulse Rate 06/26/16 2318 84     Resp 06/26/16 2318 18     Temp 06/26/16 2318 98.1 F (36.7 C)     Temp Source 06/26/16 2318 Oral     SpO2 06/26/16 2318 97 %     Weight 06/26/16 2318 166 lb (75.3 kg)     Height 06/26/16 2318 5\' 4"  (1.626 m)     Head Circumference --      Peak Flow --      Pain Score 06/26/16 2319 7   Constitutional: Alert and oriented. Well appearing and in no distress. Eyes: Conjunctivae are normal. Normal extraocular movements. ENT   Head: Normocephalic and atraumatic.   Nose: No congestion/rhinnorhea.   Mouth/Throat: Mucous membranes are moist.   Neck: No stridor. Hematological/Lymphatic/Immunilogical: No cervical lymphadenopathy. Cardiovascular: Normal rate, regular rhythm.  No murmurs, rubs, or gallops.  Respiratory: Normal respiratory effort without tachypnea nor retractions. Breath sounds are clear and equal bilaterally. No wheezes/rales/rhonchi. Gastrointestinal: Soft and mildly tender in the right lower quadrant. No rebound. No guarding.  Genitourinary: Deferred Musculoskeletal: Normal range of motion in all extremities. No lower extremity edema. Neurologic:  Normal speech and language. No gross focal neurologic deficits are appreciated.  Skin:  Skin is warm, dry and intact. No rash noted. Psychiatric: Mood and affect are normal. Speech and behavior are normal.  Patient exhibits appropriate insight and judgment.  ____________________________________________    LABS (pertinent positives/negatives)  Labs Reviewed  COMPREHENSIVE METABOLIC PANEL - Abnormal; Notable for the following:       Result Value   Glucose, Bld 135 (*)    All other components within normal limits  CBC - Abnormal; Notable for the following:    WBC 14.9 (*)    All other components within normal limits  URINALYSIS COMPLETEWITH MICROSCOPIC ([facility] ONLY) - Abnormal; Notable for the following:    Color, Urine YELLOW (*)    APPearance CLEAR (*)    Ketones, ur 2+ (*)    Hgb urine dipstick 2+ (*)    Protein, ur 30 (*)    Squamous Epithelial / LPF 0-5 (*)    All other components within normal limits  LIPASE, BLOOD     ____________________________________________   EKG  None  ____________________________________________    RADIOLOGY  None  ____________________________________________   PROCEDURES  Procedures  ____________________________________________   INITIAL IMPRESSION / ASSESSMENT AND PLAN / ED COURSE  Pertinent labs &  imaging results that were available during my care of the patient were reviewed by me and considered in my medical decision making (see chart for details).  Patient with RLQ pain, nausea and vomiting. Had loose stools yesterday. S/p appendectomy. Blood work with mild leukocytosis. Patient was given fluids, nausea medication. Felt much better after bentyl. At this point given relatively benign exam and lack of appendix did defer imaging. Patient felt comfortable going home with prescriptions. Discussed return precautions.   ____________________________________________   FINAL CLINICAL IMPRESSION(S) / ED DIAGNOSES  Final diagnoses:  Gastroenteritis     Note: This dictation was prepared with Dragon dictation. Any transcriptional errors that result from this process are unintentional    Nance Pear, MD 06/27/16 [phone]

## 2016-06-30 ENCOUNTER — Encounter: Admission: RE | Admit: 2016-06-30 | Payer: Medicare Other | Source: Ambulatory Visit | Admitting: Internal Medicine

## 2016-07-09 ENCOUNTER — Ambulatory Visit
Admission: RE | Admit: 2016-07-09 | Discharge: 2016-07-09 | Disposition: A | Discharge status: Home / Self Care | Payer: Medicare Other | Source: Ambulatory Visit | Attending: Infectious Diseases | Admitting: Infectious Diseases

## 2016-07-09 ENCOUNTER — Ambulatory Visit
Admission: RE | Admit: 2016-07-09 | Discharge: 2016-07-09 | Disposition: A | Discharge status: Home / Self Care | Payer: Medicare Other | Source: Ambulatory Visit | Attending: Internal Medicine | Admitting: Internal Medicine

## 2016-07-09 ENCOUNTER — Other Ambulatory Visit: Payer: Self-pay | Admitting: Infectious Diseases

## 2016-07-09 ENCOUNTER — Other Ambulatory Visit: Payer: Self-pay | Admitting: Internal Medicine

## 2016-07-09 ENCOUNTER — Other Ambulatory Visit: Payer: Self-pay | Admitting: Nurse Practitioner

## 2016-07-09 DIAGNOSIS — K529 Noninfective gastroenteritis and colitis, unspecified: Secondary | ICD-10-CM | POA: Insufficient documentation

## 2016-07-09 DIAGNOSIS — M79605 Pain in left leg: Secondary | ICD-10-CM

## 2016-07-09 DIAGNOSIS — R1084 Generalized abdominal pain: Secondary | ICD-10-CM

## 2016-07-09 DIAGNOSIS — I82412 Acute embolism and thrombosis of left femoral vein: Secondary | ICD-10-CM | POA: Insufficient documentation

## 2016-07-09 DIAGNOSIS — R935 Abnormal findings on diagnostic imaging of other abdominal regions, including retroperitoneum: Secondary | ICD-10-CM | POA: Insufficient documentation

## 2016-07-09 MED ORDER — IOPAMIDOL (ISOVUE-300) INJECTION 61%
100.0000 mL | Freq: Once | INTRAVENOUS | Status: AC | PRN
  Administered 2016-07-09: 100 mL via INTRAVENOUS

## 2016-07-14 ENCOUNTER — Ambulatory Visit: Payer: Medicare Other

## 2016-07-19 DIAGNOSIS — Z8619 Personal history of other infectious and parasitic diseases: Secondary | ICD-10-CM | POA: Insufficient documentation

## 2016-08-26 ENCOUNTER — Other Ambulatory Visit: Payer: Self-pay | Admitting: Internal Medicine

## 2016-08-26 DIAGNOSIS — Z1231 Encounter for screening mammogram for malignant neoplasm of breast: Secondary | ICD-10-CM

## 2016-09-06 ENCOUNTER — Ambulatory Visit: Payer: Medicare Other

## 2016-10-01 ENCOUNTER — Ambulatory Visit: Payer: Medicare Other | Attending: Internal Medicine

## 2016-12-16 ENCOUNTER — Ambulatory Visit (INDEPENDENT_AMBULATORY_CARE_PROVIDER_SITE_OTHER): Payer: Medicare Other | Admitting: Vascular Surgery

## 2016-12-16 ENCOUNTER — Encounter (INDEPENDENT_AMBULATORY_CARE_PROVIDER_SITE_OTHER): Payer: Self-pay | Admitting: Vascular Surgery

## 2016-12-16 VITALS — BP 134/80 | HR 86 | Resp 17 | Wt 181.0 lb

## 2016-12-16 DIAGNOSIS — I83819 Varicose veins of unspecified lower extremities with pain: Secondary | ICD-10-CM | POA: Diagnosis not present

## 2016-12-16 DIAGNOSIS — I872 Venous insufficiency (chronic) (peripheral): Secondary | ICD-10-CM

## 2016-12-16 DIAGNOSIS — S82045D Nondisplaced comminuted fracture of left patella, subsequent encounter for closed fracture with routine healing: Secondary | ICD-10-CM

## 2016-12-16 DIAGNOSIS — M79605 Pain in left leg: Secondary | ICD-10-CM | POA: Diagnosis not present

## 2016-12-16 DIAGNOSIS — M79604 Pain in right leg: Secondary | ICD-10-CM | POA: Diagnosis not present

## 2016-12-17 DIAGNOSIS — I83819 Varicose veins of unspecified lower extremities with pain: Secondary | ICD-10-CM | POA: Insufficient documentation

## 2016-12-17 DIAGNOSIS — I872 Venous insufficiency (chronic) (peripheral): Secondary | ICD-10-CM | POA: Insufficient documentation

## 2016-12-17 DIAGNOSIS — M79606 Pain in leg, unspecified: Secondary | ICD-10-CM | POA: Insufficient documentation

## 2016-12-17 DIAGNOSIS — M7060 Trochanteric bursitis, unspecified hip: Secondary | ICD-10-CM | POA: Insufficient documentation

## 2016-12-17 NOTE — Progress Notes (Signed)
MRN : [id number]  Darlene Hubbard is a 66 y.o. (31-Aug-1950) female who presents with chief complaint of  Chief Complaint  Patient presents with  . Leg Pain  .  History of Present Illness: The patient returns for followup evaluation regarding her varicose veins. The patient continues to have pain in the lower extremities with dependency. The pain is lessened with elevation. Graduated compression stockings, Class I (20-30 mmHg), have been worn but the stockings do not eliminate the leg pain. Over-the-counter analgesics do not improve the symptoms. The degree of discomfort continues to interfere with daily activities. The patient notes the pain in the legs is causing problems with daily exercise, at the workplace and even with household activities and maintenance such as standing in the kitchen preparing meals and doing dishes.   She was not seen in the interim as originally scheduled because she broke her left knee and this required surgery.  Post operatively she developed a DVT of the left common femoral vein and was treated with Xarelto.  This has been DC'd.  She aso deveopled C diff which has now been treated.   Current Meds  Medication Sig  . ALPRAZolam (XANAX) 0.5 MG tablet   . cetirizine (ZYRTEC) 10 MG tablet Take by mouth.  . Cholecalciferol (VITAMIN D-3 PO) Take 2,000 Int'l Units by mouth.   . Fluticasone-Salmeterol (ADVAIR) 100-50 MCG/DOSE AEPB Inhale 1 puff into the lungs 2 (two) times daily.  Marland Kitchen gabapentin (NEURONTIN) 300 MG capsule Take 300 mg by mouth 2 (two) times daily.   . Multiple Vitamin (MULTI-VITAMINS) TABS Take by mouth.    Past Medical History:  Diagnosis Date  . Allergy   . Asthma   . Bursitis    BOTH SHOULDER AND HIPS  . Fibromyalgia   . GERD (gastroesophageal reflux disease)   . Hiatal hernia   . IBS (irritable bowel syndrome)   . Mild obstructive sleep apnea   . Morbid obesity ([place])   . Spinal stenosis   . Varicose veins     Past Surgical History:    Procedure Laterality Date  . ABDOMINAL HYSTERECTOMY  1993  . APPENDECTOMY  1995  . BACK SURGERY    . BREAST BIOPSY Left 2000   neg  . CHOLECYSTECTOMY    . COLONOSCOPY  2008  . COLONOSCOPY WITH PROPOFOL N/A 02/18/2016   Procedure: COLONOSCOPY WITH PROPOFOL;  Surgeon: [name], MD;  Location: [facility] ENDOSCOPY;  Service: Endoscopy;  Laterality: N/A;  . ORIF PATELLA Left 06/02/2016   Procedure: OPEN REDUCTION INTERNAL (ORIF) FIXATION PATELLA;  Surgeon: Dereck Leep, MD;  Location: [facility] ORS;  Service: Orthopedics;  Laterality: Left;  . PARTIAL HYSTERECTOMY    . PATELLAR TENDON REPAIR Left 06/02/2016   Procedure: PATELLA TENDON REPAIR;  Surgeon: Dereck Leep, MD;  Location: [facility] ORS;  Service: Orthopedics;  Laterality: Left;  . TUBAL LIGATION    . UPPER GI ENDOSCOPY  2008  . vein closure procedure Bilateral 2010    Social History Social History  Substance Use Topics  . Smoking status: Never Smoker  . Smokeless tobacco: Never Used  . Alcohol use No    Family History Family History  Problem Relation Age of Onset  . Colon cancer Father 22  . Thrombocytopenia Mother 31  . Other Sister     Bone Marrow disorder  No family history of bleeding/clotting disorders, porphyria or autoimmune disease   Allergies  Allergen Reactions  . Molds & Smuts   . Penicillins   .  Diovan [Valsartan] Rash    Edema  . Sulfa Drugs Cross Reactors Itching and Rash     REVIEW OF SYSTEMS (Negative unless checked)  Constitutional: [] Weight loss  [] Fever  [] Chills Cardiac: [] Chest pain   [] Chest pressure   [] Palpitations   [] Shortness of breath when laying flat   [] Shortness of breath with exertion. Vascular:  [] Pain in legs with walking   [x] Pain in legs with standing  [x] History of DVT   [x] Phlebitis   [] Swelling in legs   [x] Varicose veins   [] Non-healing ulcers Pulmonary:   [] Uses home oxygen   [] Productive cough   [] Hemoptysis   [] Wheeze  [] COPD   [] Asthma Neurologic:  [] Dizziness    [] Seizures   [] History of stroke   [] History of TIA  [] Aphasia   [] Vissual changes   [] Weakness or numbness in arm   [] Weakness or numbness in leg Musculoskeletal:   [] Joint swelling   [] Joint pain   [] Low back pain Hematologic:  [] Easy bruising  [] Easy bleeding   [] Hypercoagulable state   [] Anemic Gastrointestinal:  [] Diarrhea   [] Vomiting  [] Gastroesophageal reflux/heartburn   [] Difficulty swallowing. Genitourinary:  [] Chronic kidney disease   [] Difficult urination  [] Frequent urination   [] Blood in urine Skin:  [] Rashes   [] Ulcers  Psychological:  [] History of anxiety   []  History of major depression.  Physical Examination  Vitals:   12/16/16 1602  BP: 134/80  Pulse: 86  Resp: 17  Weight: 82.1 kg (181 lb)   Body mass index is 31.07 kg/m. Gen: WD/WN, NAD Head: [place]/AT, No temporalis wasting.  Ear/Nose/Throat: Hearing grossly intact, nares w/o erythema or drainage Eyes: PER, EOMI, sclera nonicteric.  Neck: Supple, no large masses.   Pulmonary:  Good air movement, no audible wheezing bilaterally, no use of accessory muscles.  Cardiac: RRR, no JVD Vascular: Large varicosities present extensively bilaterally greater than 4-6 mm with moderate venous stasis changes to the legs bilaterally.  trace edema Vessel Right Left  Radial Palpable Palpable  Ulnar Palpable Palpable  Brachial Palpable Palpable  Carotid Palpable Palpable  Femoral Palpable Palpable  Popliteal Palpable Palpable  PT Palpable Palpable  DP Palpable Palpable  Gastrointestinal: Non-distended. No guarding/no peritoneal signs.  Musculoskeletal: M/S 5/5 throughout.  No deformity or atrophy.  Neurologic: CN 2-12 intact. Symmetrical.  Speech is fluent. Motor exam as listed above. Psychiatric: Judgment intact, Mood & affect appropriate for pt's clinical situation. Dermatologic: venous rashes no ulcers noted.  No changes consistent with cellulitis. Lymph : No lichenification or skin changes of chronic lymphedema.  CBC Lab  Results  Component Value Date   WBC 14.9 (H) 06/26/2016   HGB 14.0 06/26/2016   HCT 41.1 06/26/2016   MCV 86.7 06/26/2016   PLT 255 06/26/2016    BMET    Component Value Date/Time   NA 140 06/26/2016 2320   NA 139 04/26/2013 1716   K 4.0 06/26/2016 2320   K 3.4 (L) 04/26/2013 1716   CL 106 06/26/2016 2320   CL 104 04/26/2013 1716   CO2 24 06/26/2016 2320   CO2 30 04/26/2013 1716   GLUCOSE 135 (H) 06/26/2016 2320   GLUCOSE 97 04/26/2013 1716   BUN 16 06/26/2016 2320   BUN 18 04/26/2013 1716   CREATININE 0.82 06/26/2016 2320   CREATININE 0.85 04/26/2013 1716   CALCIUM 9.2 06/26/2016 2320   CALCIUM 8.8 04/26/2013 1716   GFRNONAA >60 06/26/2016 2320   GFRNONAA >60 04/26/2013 1716   GFRAA >60 06/26/2016 2320   GFRAA >60 04/26/2013 1716  CrCl cannot be calculated (Patient's most recent lab result is older than the maximum 21 days allowed.).  COAG No results found for: INR, PROTIME  Radiology No results found.   Assessment/Plan 1. Varicose veins with pain No surgery or intervention at this point in time.    I have had a long discussion with the patient regarding venous insufficiency and why it  causes symptoms. I have discussed with the patient the chronic skin changes that accompany venous insufficiency and the long term sequela such as infection and ulceration.  Patient will begin wearing mild graduated compression stockings on a daily basis a prescription was given.  This is primarily do the  Venous stasis changes and not in an attempt to control swelling. The patient will put the stockings on first thing in the morning and removing them in the evening. The patient is instructed specifically not to sleep in the stockings.    In addition, behavioral modification including several periods of elevation of the lower extremities during the day will be continued. I have demonstrated that proper elevation is a position with the ankles at heart level.  The patient is instructed  to begin routine exercise, especially walking on a daily basis  Patient should undergo duplex ultrasound of the venous system to assess residual  DVT.  Following the review of the ultrasound the patient will follow up in 2-3 months to reassess the degree of swelling and the control that graduated compression stockings or compression wraps  is offering.   The patient can be assessed for a Lymph Pump at that time  2. Chronic venous insufficiency See # 1  3. Pain in both lower extremities See #1  4. Closed nondisplaced comminuted fracture of left patella with routine healing, subsequent encounter Continue NSAID's and PT  Follow up with Ortho as arranged    Hortencia Pilar, MD  12/17/2016 8:16 AM

## 2017-02-07 ENCOUNTER — Ambulatory Visit (INDEPENDENT_AMBULATORY_CARE_PROVIDER_SITE_OTHER): Payer: Medicare Other

## 2017-02-07 ENCOUNTER — Encounter (INDEPENDENT_AMBULATORY_CARE_PROVIDER_SITE_OTHER): Payer: Self-pay | Admitting: Vascular Surgery

## 2017-02-07 ENCOUNTER — Ambulatory Visit (INDEPENDENT_AMBULATORY_CARE_PROVIDER_SITE_OTHER): Payer: Medicare Other | Admitting: Vascular Surgery

## 2017-02-07 ENCOUNTER — Other Ambulatory Visit (INDEPENDENT_AMBULATORY_CARE_PROVIDER_SITE_OTHER): Payer: Self-pay | Admitting: Vascular Surgery

## 2017-02-07 VITALS — BP 142/84 | HR 72 | Resp 16 | Wt 176.0 lb

## 2017-02-07 DIAGNOSIS — I872 Venous insufficiency (chronic) (peripheral): Secondary | ICD-10-CM | POA: Diagnosis not present

## 2017-02-07 DIAGNOSIS — I824Z2 Acute embolism and thrombosis of unspecified deep veins of left distal lower extremity: Secondary | ICD-10-CM

## 2017-02-07 DIAGNOSIS — I82412 Acute embolism and thrombosis of left femoral vein: Secondary | ICD-10-CM

## 2017-02-07 NOTE — Progress Notes (Signed)
Subjective:    Patient ID: Darlene Hubbard, female    DOB: 1951/07/24, 66 y.o.   MRN: [id number] Chief Complaint  Patient presents with  . Follow-up   Patient presents to review vascular studies. Patient was last seen 12/16/2016 in regard to a DVT she developed postoperatively. Since that visit, the patient has been wearing medical grade 1 compression stockings, elevating her bilateral lower extremity and remaining active. The patient has seen an improvement in her symptoms and presents without complaint today. Today, the patient underwent a left lower extremity venous duplex exam which was notable for no evidence of deep or superficial vein thrombosis. Previous duplex on 07/09/2016 [facility] showed nonocclusive thrombus in the left common femoral and femoral veins. Patient denies fever nausea or vomiting.    Review of Systems  Constitutional: Negative.   HENT: Negative.   Eyes: Negative.   Respiratory: Negative.   Cardiovascular: Negative.   Gastrointestinal: Negative.   Endocrine: Negative.   Genitourinary: Negative.   Musculoskeletal: Negative.   Skin: Negative.   Allergic/Immunologic: Negative.   Neurological: Negative.   Hematological: Negative.   Psychiatric/Behavioral: Negative.       Objective:   Physical Exam  Constitutional: She is oriented to person, place, and time. She appears well-developed and well-nourished. No distress.  HENT:  Head: Normocephalic and atraumatic.  Eyes: Conjunctivae are normal. Pupils are equal, round, and reactive to light.  Neck: Normal range of motion.  Cardiovascular: Normal rate, regular rhythm, normal heart sounds and intact distal pulses.   Pulses:      Radial pulses are 2+ on the right side, and 2+ on the left side.       Dorsalis pedis pulses are 2+ on the right side, and 2+ on the left side.       Posterior tibial pulses are 2+ on the right side, and 2+ on the left side.  Pulmonary/Chest: Effort normal.    Musculoskeletal: Normal range of motion. She exhibits no edema.  Neurological: She is alert and oriented to person, place, and time.  Skin: Skin is warm and dry. She is not diaphoretic.  Psychiatric: She has a normal mood and affect. Her behavior is normal. Judgment and thought content normal.  Vitals reviewed.   BP (!) 142/84   Pulse 72   Resp 16   Wt 176 lb (79.8 kg)   BMI 30.21 kg/m   Past Medical History:  Diagnosis Date  . Allergy   . Asthma   . Bursitis    BOTH SHOULDER AND HIPS  . Fibromyalgia   . GERD (gastroesophageal reflux disease)   . Hiatal hernia   . IBS (irritable bowel syndrome)   . Mild obstructive sleep apnea   . Morbid obesity ([place])   . Spinal stenosis   . Varicose veins     Social History   Social History  . Marital status: Widowed    Spouse name: N/A  . Number of children: N/A  . Years of education: N/A   Occupational History  . Not on file.   Social History Main Topics  . Smoking status: Never Smoker  . Smokeless tobacco: Never Used  . Alcohol use No  . Drug use: No  . Sexual activity: Not on file   Other Topics Concern  . Not on file   Social History Narrative  . No narrative on file    Past Surgical History:  Procedure Laterality Date  . ABDOMINAL HYSTERECTOMY  1993  . APPENDECTOMY  1995  . BACK SURGERY    . BREAST BIOPSY Left 2000   neg  . CHOLECYSTECTOMY    . COLONOSCOPY  2008  . COLONOSCOPY WITH PROPOFOL N/A 02/18/2016   Procedure: COLONOSCOPY WITH PROPOFOL;  Surgeon: [name], MD;  Location: [facility] ENDOSCOPY;  Service: Endoscopy;  Laterality: N/A;  . ORIF PATELLA Left 06/02/2016   Procedure: OPEN REDUCTION INTERNAL (ORIF) FIXATION PATELLA;  Surgeon: Dereck Leep, MD;  Location: [facility] ORS;  Service: Orthopedics;  Laterality: Left;  . PARTIAL HYSTERECTOMY    . PATELLAR TENDON REPAIR Left 06/02/2016   Procedure: PATELLA TENDON REPAIR;  Surgeon: Dereck Leep, MD;  Location: [facility] ORS;  Service: Orthopedics;   Laterality: Left;  . TUBAL LIGATION    . UPPER GI ENDOSCOPY  2008  . vein closure procedure Bilateral 2010    Family History  Problem Relation Age of Onset  . Colon cancer Father 47  . Thrombocytopenia Mother 31  . Other Sister        Bone Marrow disorder    Allergies  Allergen Reactions  . Molds & Smuts   . Penicillins   . Diovan [Valsartan] Rash    Edema  . Sulfa Drugs Cross Reactors Itching and Rash       Assessment & Plan:  Patient presents to review vascular studies. Patient was last seen 12/16/2016 in regard to a DVT she developed postoperatively. Since that visit, the patient has been wearing medical grade 1 compression stockings, elevating her bilateral lower extremity and remaining active. The patient has seen an improvement in her symptoms and presents without complaint today. Today, the patient underwent a left lower extremity venous duplex exam which was notable for no evidence of deep or superficial vein thrombosis. Previous duplex on 07/09/2016 [facility] showed nonocclusive thrombus in the left common femoral and femoral veins. Patient denies fever nausea or vomiting.   1. Deep vein thrombosis (DVT) of distal vein of left lower extremity, unspecified chronicity (HCC) - Improvement No DVT or SVT found on duplex today. This is an improvement when compared to her last ultrasound in November 2017. The patient has been engaging in conservative therapy with good response. She has been wearing medical grade 1 compression stockings, elevating her legs heart level or higher and remaining active. Conservative therapy has improved her symptoms. Patient to follow up when necessary  Current Outpatient Prescriptions on File Prior to Visit  Medication Sig Dispense Refill  . ALPRAZolam (XANAX) 0.5 MG tablet     . Cholecalciferol (VITAMIN D-3 PO) Take 2,000 Int'l Units by mouth.     . Fluticasone-Salmeterol (ADVAIR) 100-50 MCG/DOSE AEPB Inhale 1 puff into the  lungs 2 (two) times daily.    Marland Kitchen gabapentin (NEURONTIN) 300 MG capsule Take 300 mg by mouth 2 (two) times daily.     . montelukast (SINGULAIR) 10 MG tablet Take 10 mg by mouth daily.    . ondansetron (ZOFRAN) 4 MG tablet Take 1 tablet (4 mg total) by mouth every 8 (eight) hours as needed for nausea or vomiting. 20 tablet 0  . sertraline (ZOLOFT) 50 MG tablet Take by mouth.    . traMADol (ULTRAM) 50 MG tablet Take 1-2 tablets (50-100 mg total) by mouth every 4 (four) hours as needed for moderate pain. 60 tablet 1  . cetirizine (ZYRTEC) 10 MG tablet Take by mouth.    . dicyclomine (BENTYL) 20 MG tablet Take 1 tablet (20 mg total) by mouth 3 (three) times daily as needed (  abdominal pain). (Patient not taking: Reported on 12/16/2016) 30 tablet 0  . Multiple Vitamin (MULTI-VITAMINS) TABS Take by mouth.    . oxyCODONE (OXY IR/ROXICODONE) 5 MG immediate release tablet Take 1-2 tablets (5-10 mg total) by mouth every 4 (four) hours as needed for breakthrough pain ((for MODERATE breakthrough pain)). (Patient not taking: Reported on 12/16/2016) 30 tablet 0  . polyethylene glycol powder (GLYCOLAX/MIRALAX) powder 255 grams one bottle for colonoscopy prep (Patient not taking: Reported on 06/02/2016) 255 g 0   No current facility-administered medications on file prior to visit.     There are no Patient Instructions on file for this visit. No Follow-up on file.   Kennady Zimmerle A Alzora Ha, PA-C

## 2017-04-06 ENCOUNTER — Ambulatory Visit
Admission: RE | Admit: 2017-04-06 | Discharge: 2017-04-06 | Disposition: A | Discharge status: Home / Self Care | Payer: Medicare Other | Source: Ambulatory Visit | Attending: Internal Medicine | Admitting: Internal Medicine

## 2017-04-06 DIAGNOSIS — Z1231 Encounter for screening mammogram for malignant neoplasm of breast: Secondary | ICD-10-CM | POA: Insufficient documentation

## 2017-06-27 DIAGNOSIS — R7989 Other specified abnormal findings of blood chemistry: Secondary | ICD-10-CM | POA: Insufficient documentation

## 2017-07-15 IMAGING — US US EXTREM LOW VENOUS*L*
1 series · 13 of 24 positions shown · non-contrast
Comparison: None.

CLINICAL DATA: Pain in left lower extremity.



[Series 1: us extrem low venous*left* · 0.11mm/px · 13 of 39 slices shown]
[im 1/39]
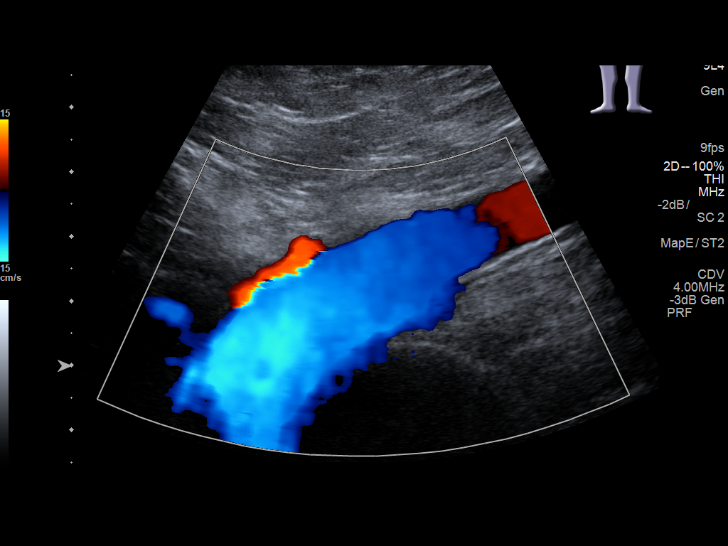
[im 4/39]
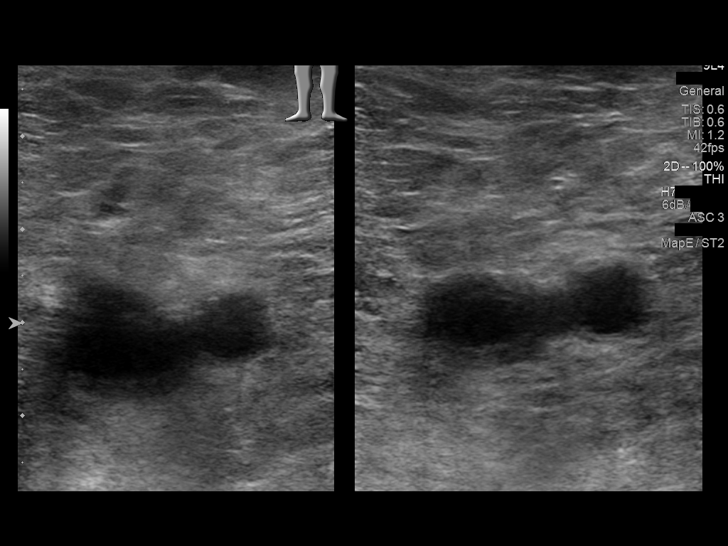
[im 7/39]
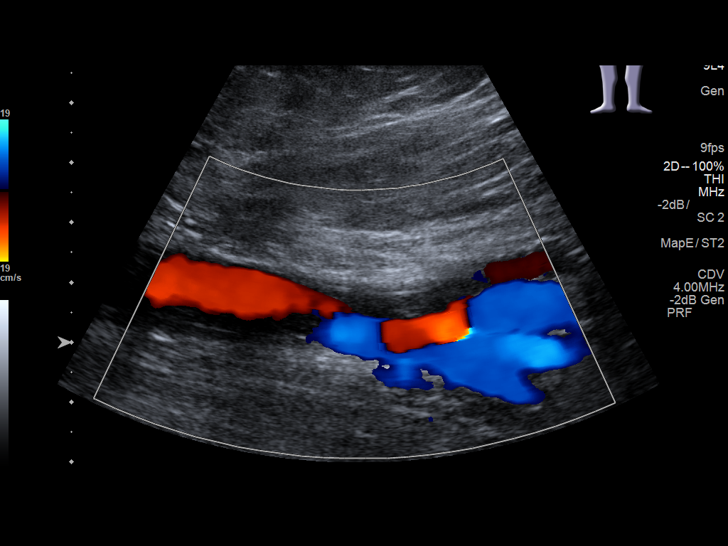
[im 10/39]
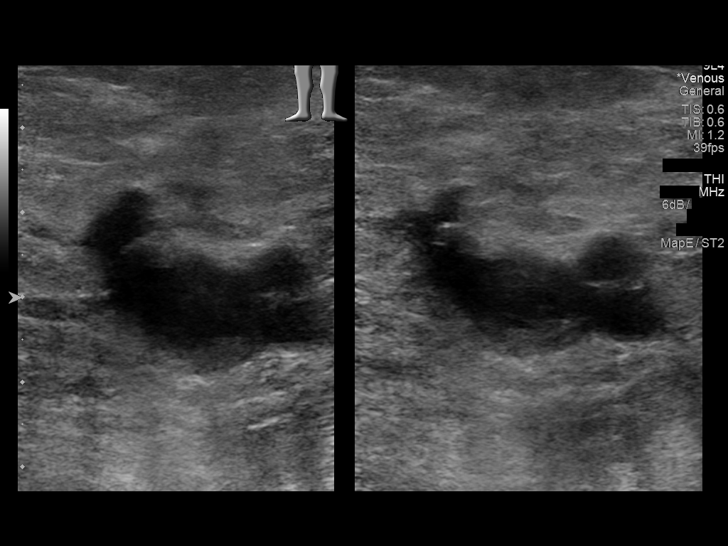
[im 14/39]
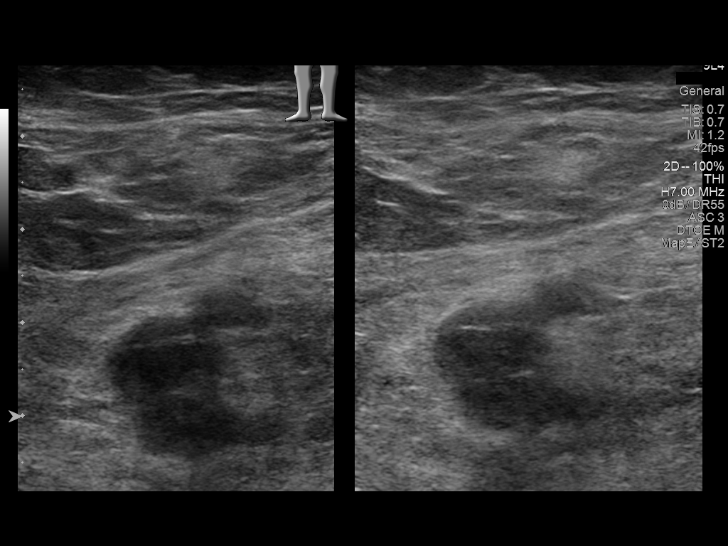
[im 17/39]
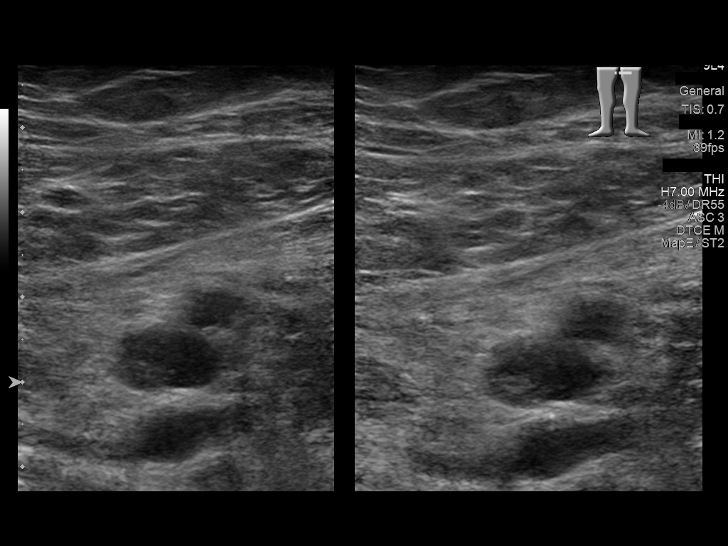
[im 20/39]
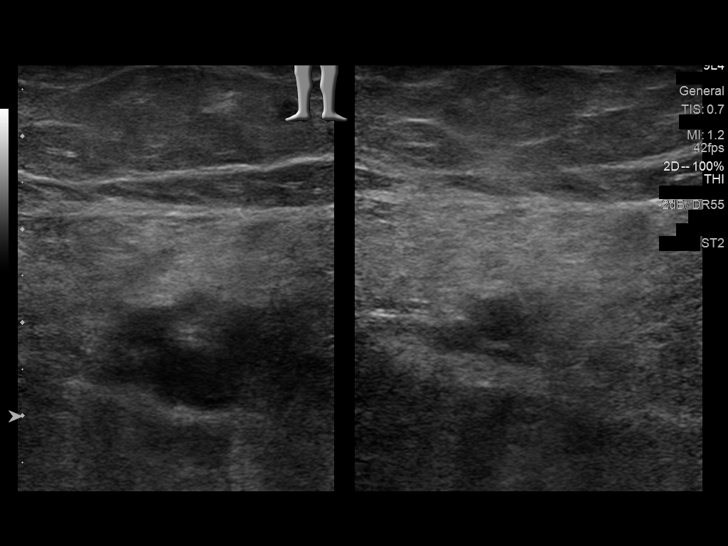
[im 22/39]
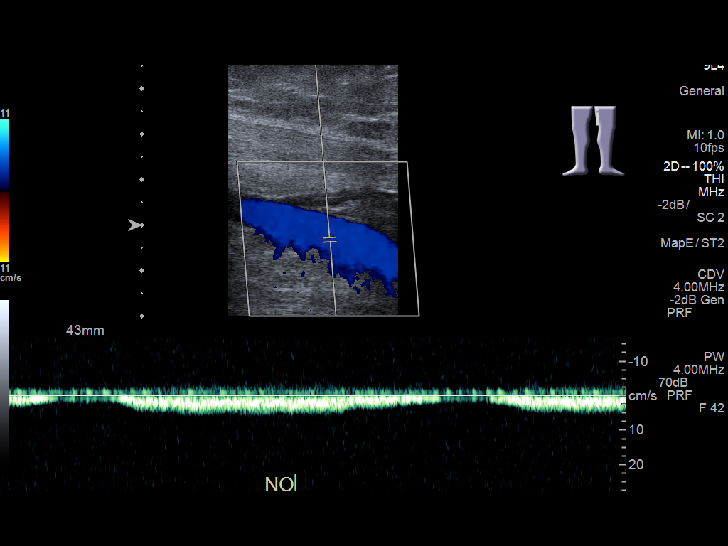
[im 25/39]
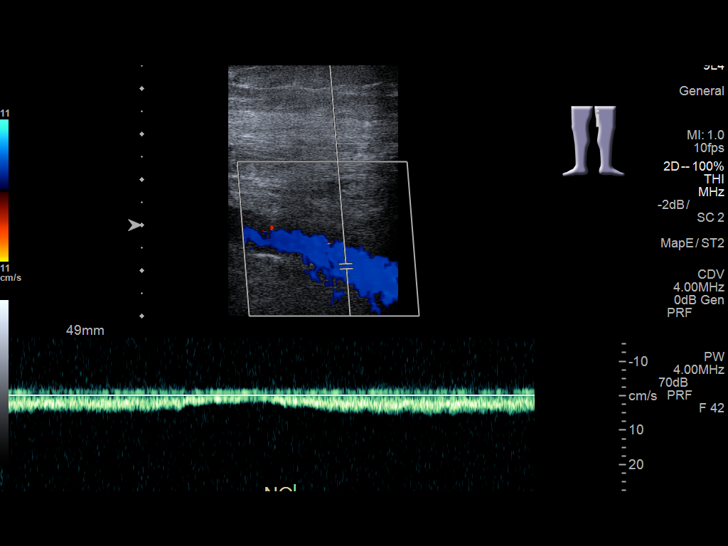
[im 29/39]
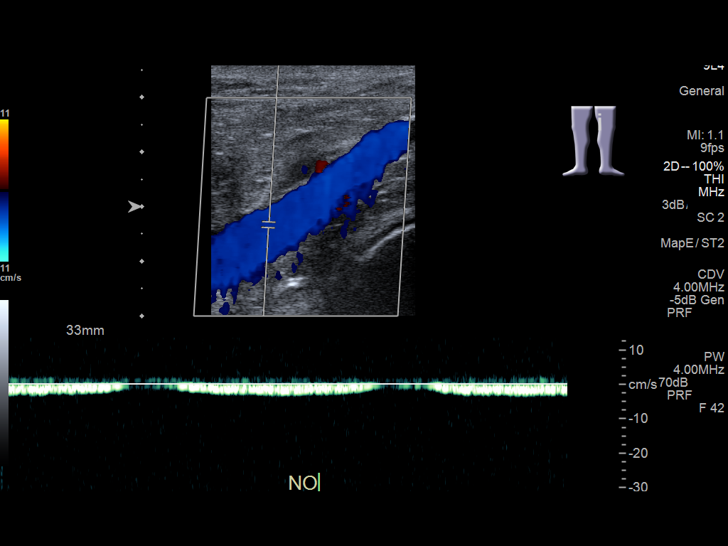
[im 32/39]
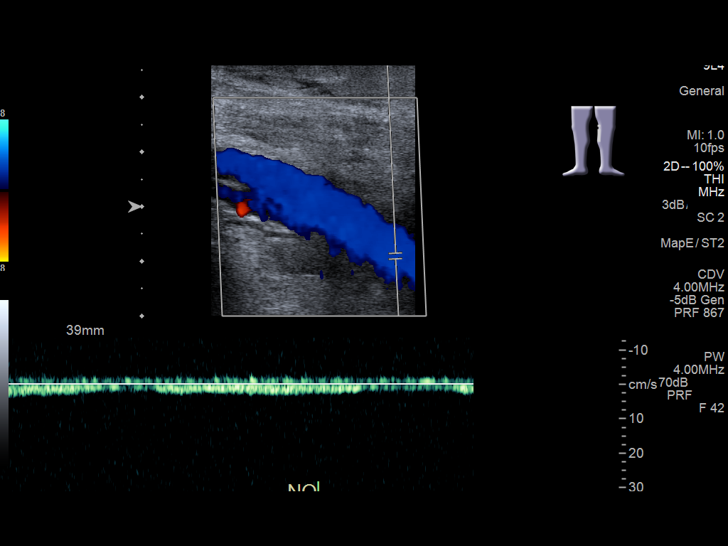
[im 35/39]
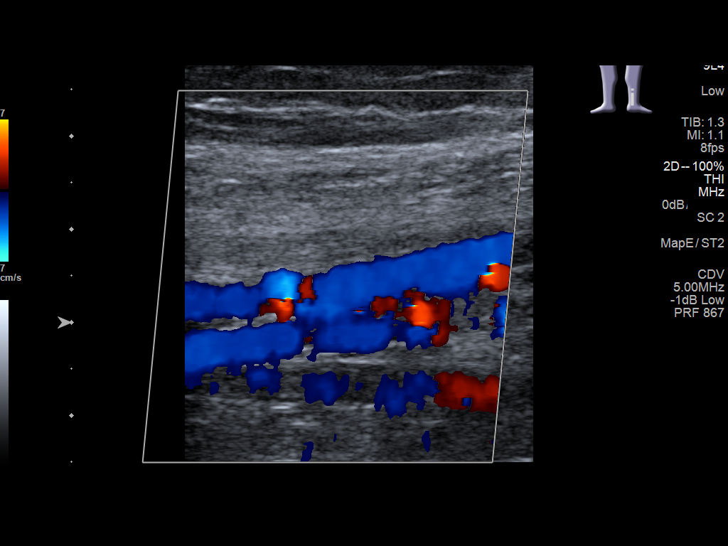
[im 39/39]
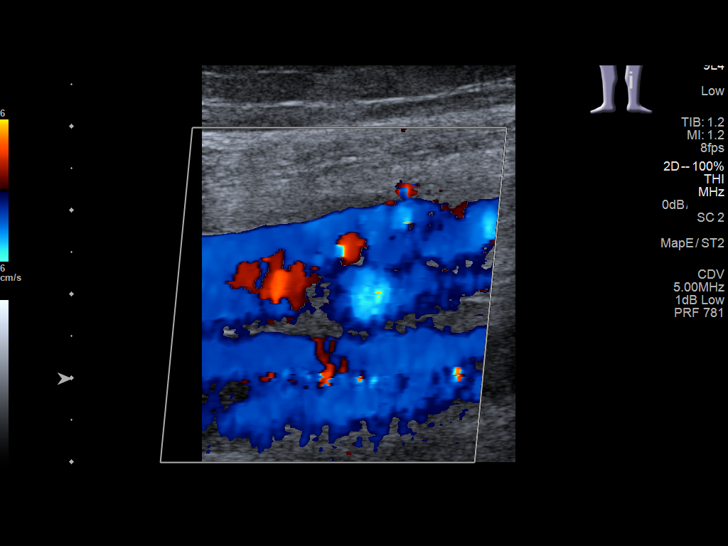

[13 of 24 positions shown; findings below may reference images not displayed]

FINDINGS: Contralateral Common Femoral Vein: Respiratory phasicity is normal
and symmetric with the symptomatic side. No evidence of thrombus.
Normal compressibility.

Common Femoral Vein: Nonocclusive thrombus is seen in the common
femoral vein.

Saphenofemoral Junction: No evidence of thrombus. Normal
compressibility and flow on color Doppler imaging.

Profunda Femoral Vein: No evidence of thrombus. Normal
compressibility and flow on color Doppler imaging.

Femoral Vein: Nonocclusive thrombus is seen in the superficial
femoral vein.

Popliteal Vein: No evidence of thrombus. Normal compressibility,
respiratory phasicity and response to augmentation.

Calf Veins: No evidence of thrombus. Normal compressibility and flow
on color Doppler imaging.

Superficial Great Saphenous Vein: No evidence of thrombus. Normal
compressibility and flow on color Doppler imaging.

Venous Reflux:  None.

Other Findings:  None.
IMPRESSION: Nonocclusive DVT in the common femoral and superficial femoral
veins. The findings are being called to the referring physician by
the sonographer.

## 2017-08-12 IMAGING — CT CT ABD-PELV W/ CM
2 of 5 series · 15 of 46 positions shown, 17 images · IV contrast (APPLIED)
Comparison: CT abdomen pelvis 11/20/2014.

ADDENDUM:
Critical Value/emergent results were called by telephone at the time
of interpretation on 07/09/2016 at [DATE] to Dr. Flash, who
verbally acknowledged these results.
CLINICAL DATA: Patient with generalized lower abdominal pain.

EXAM:
CT ABDOMEN AND PELVIS WITH CONTRAST
TECHNIQUE: Multidetector CT imaging of the abdomen and pelvis was performed
using the standard protocol following bolus administration of
intravenous contrast.
CONTRAST:  100 cc [id number]

[Series 2: axial st · axial · 0.90mm/px · z∈[-1019,-619]mm · 12 of 90 slices shown, 14 images]
[im 5/90  soft-tissue]
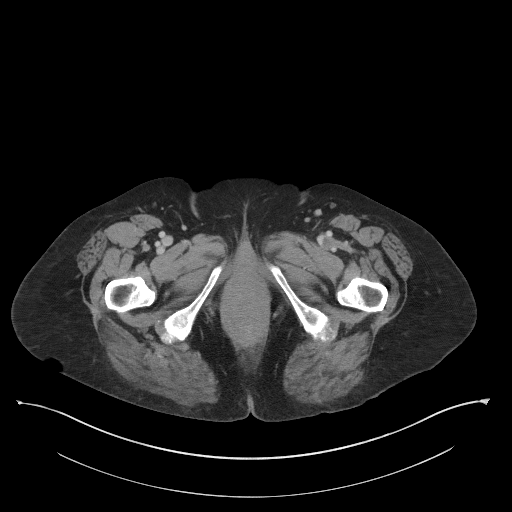
[im 5/90  bone]
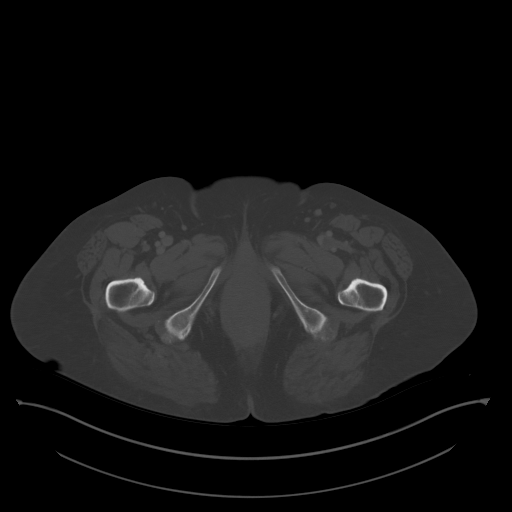
[im 15/90  soft-tissue]
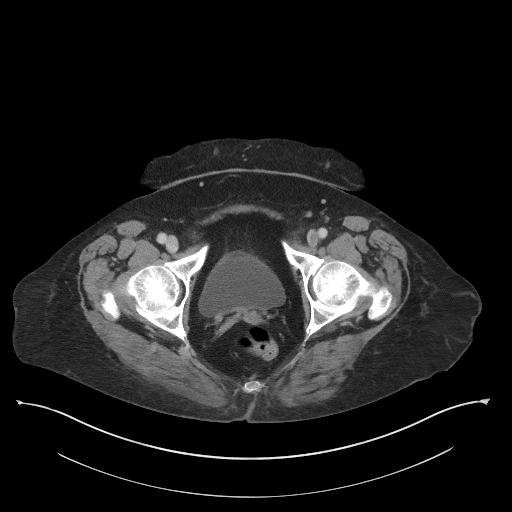
[im 20/90  soft-tissue]
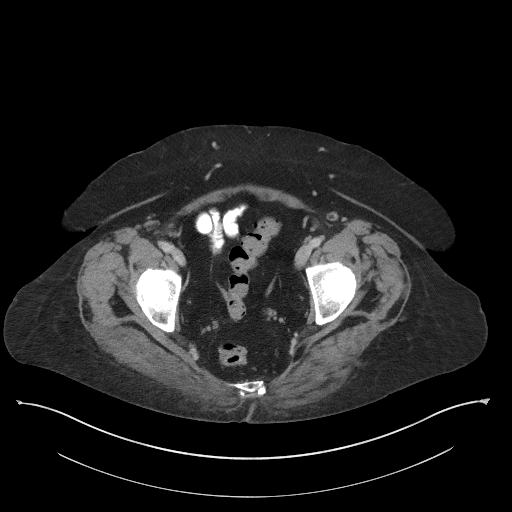
[im 25/90  soft-tissue]
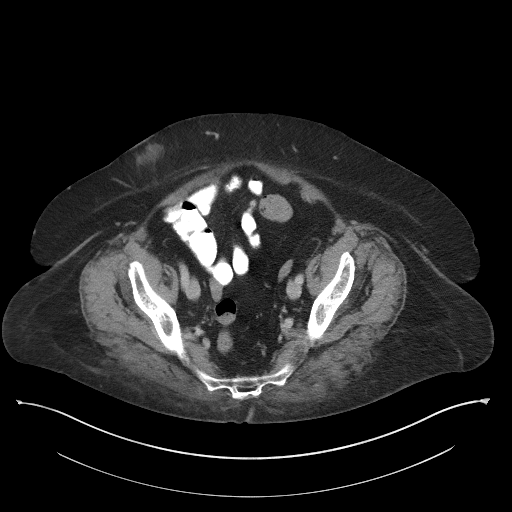
[im 35/90  soft-tissue]
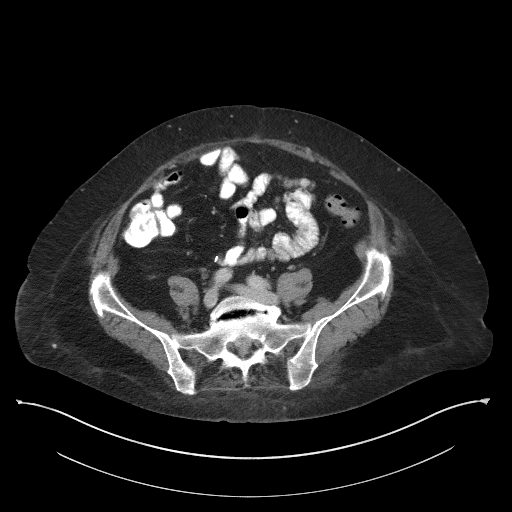
[im 40/90  soft-tissue]
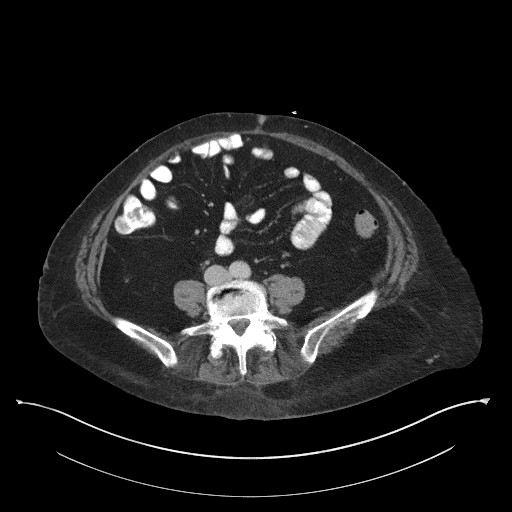
[im 50/90  soft-tissue]
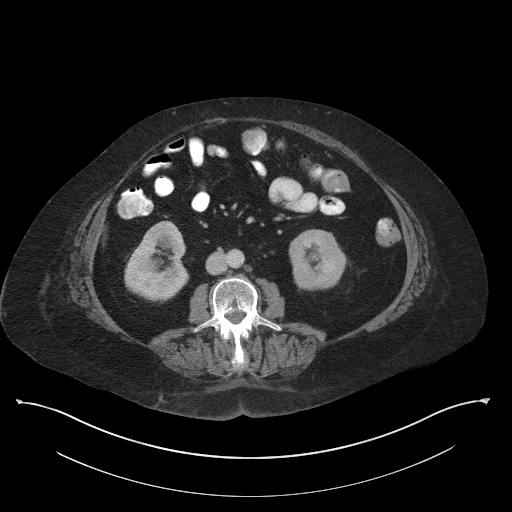
[im 55/90  soft-tissue]
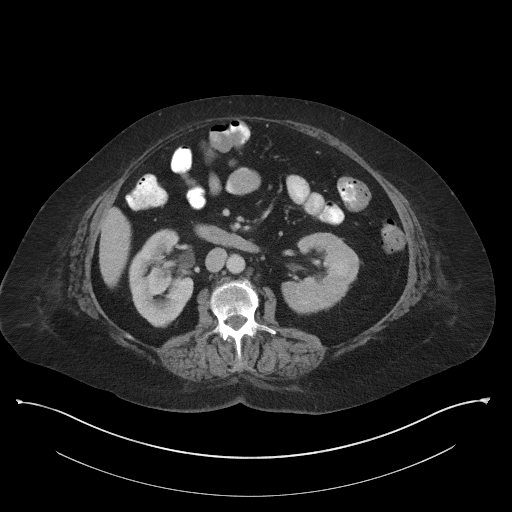
[im 65/90  soft-tissue]
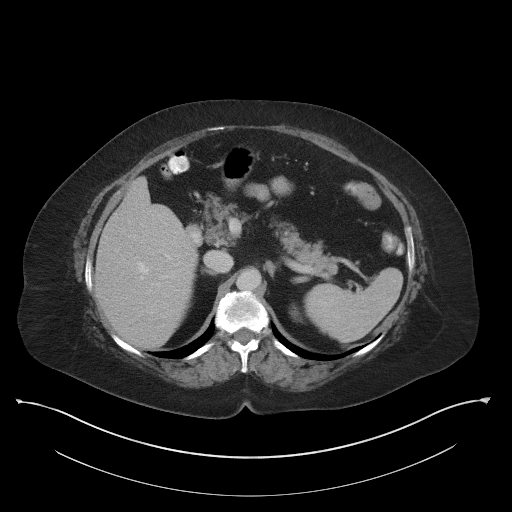
[im 65/90  bone]
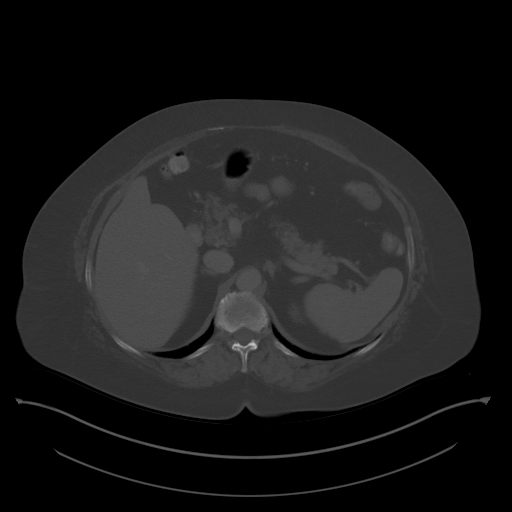
[im 70/90  soft-tissue]
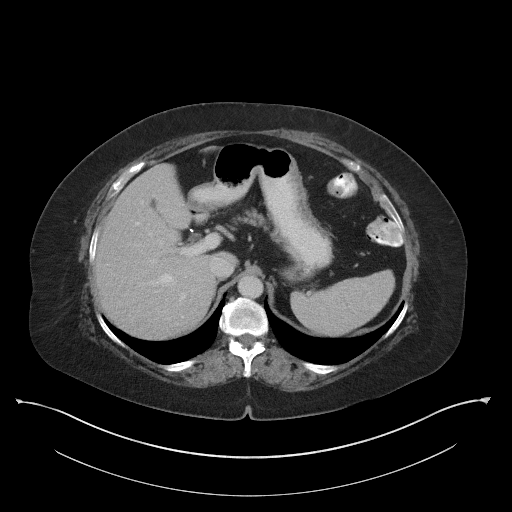
[im 75/90  soft-tissue]
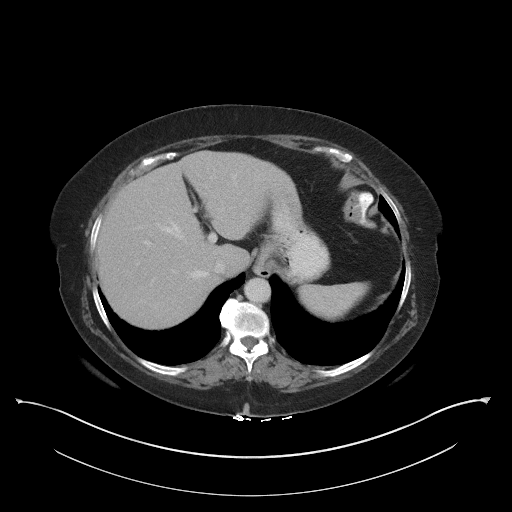
[im 85/90  soft-tissue]
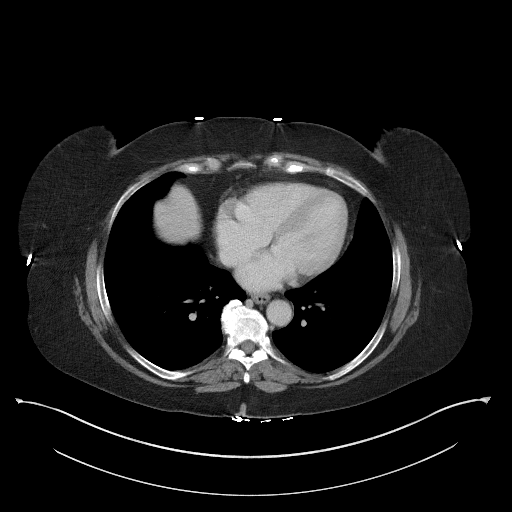

[Series 5: coronal st · coronal · 0.68mm/px · 3 of 92 slices shown]
[im 31/92  soft-tissue]
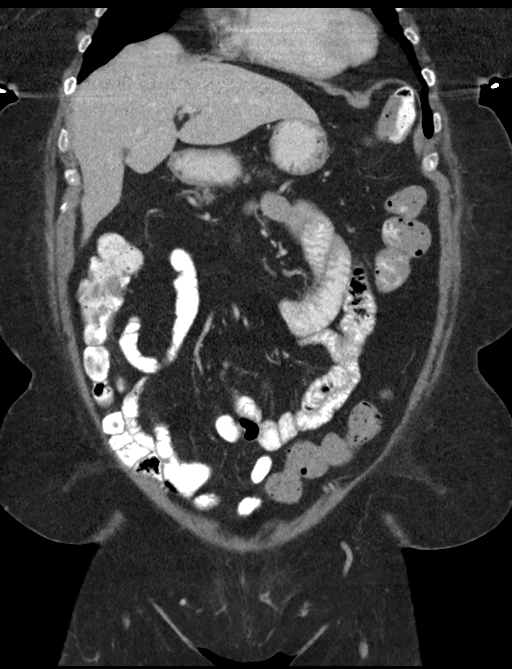
[im 41/92  soft-tissue]
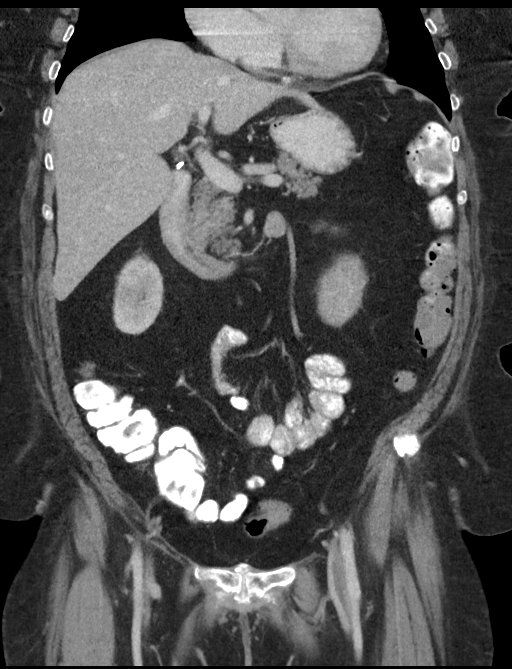
[im 51/92  soft-tissue]
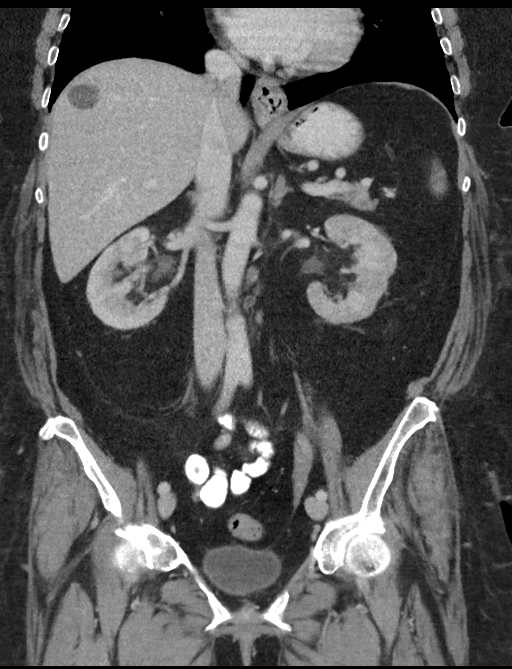

[15 of 46 positions shown; findings below may reference images not displayed]

FINDINGS: Lower chest: Normal heart size. Minimal atelectasis within the lower
lobes bilaterally. Stable 2 mm subpleural left lower lobe nodule
(image 15; series 4), dating back to 7344, compatible with benign
process. No pleural effusion.

Hepatobiliary: Liver is normal in size and contour. Unchanged cyst
within the hepatic dome measuring 2.8 cm (image 13; series 2).
Gallbladder surgically absent.

Pancreas:  Unremarkable.

Spleen: Unchanged 1 cm low-attenuation lesion within the inferior
aspect of the spleen (image 29; series 2). This is most compatible
with benign etiology given stability over time.

Adrenals/Urinary Tract: Normal adrenal glands. Kidneys enhance
symmetrically with contrast. No hydronephrosis. Urinary bladder is
unremarkable. 2 mm nonobstructing stone interpolar region left
kidney.

Stomach/Bowel: No abnormal bowel wall thickening or evidence for
bowel obstruction. Normal morphology of the stomach. Small hiatal
hernia.

Vascular/Lymphatic: Normal caliber abdominal aorta. No
retroperitoneal lymphadenopathy. Peripheral calcified
atherosclerotic plaque involving the abdominal aorta. There is a
central filling defect within the left femoral vein (image 84;
series 2).

Reproductive: Uterus is surgically absent.

Other: Within the subcutaneous fat overlying the lower right
anterior abdominal wall there is a 2.7 cm intermediate density
collection (image 67; series 2).

Musculoskeletal: Lumbar spine degenerative changes. No aggressive or
acute appearing osseous lesions.
IMPRESSION: There is central low attenuation within the left femoral vein which
is nonspecific and may represent inflow artifact versus DVT.
Recommend correlation with left lower extremity Doppler ultrasound.

Within the right lower anterior abdominal wall subcutaneous fat
there is a small fluid collection which may represent sequelae of
injection. Recommend clinical correlation for the possibility of
infectious process in this location.

No acute process within the abdomen or pelvis.

## 2017-12-19 DIAGNOSIS — E538 Deficiency of other specified B group vitamins: Secondary | ICD-10-CM | POA: Insufficient documentation

## 2017-12-19 DIAGNOSIS — Z86718 Personal history of other venous thrombosis and embolism: Secondary | ICD-10-CM | POA: Insufficient documentation

## 2017-12-19 DIAGNOSIS — Z0000 Encounter for general adult medical examination without abnormal findings: Secondary | ICD-10-CM | POA: Insufficient documentation

## 2018-05-23 ENCOUNTER — Other Ambulatory Visit: Payer: Self-pay | Admitting: Obstetrics and Gynecology

## 2018-05-23 DIAGNOSIS — Z1231 Encounter for screening mammogram for malignant neoplasm of breast: Secondary | ICD-10-CM

## 2018-06-08 ENCOUNTER — Ambulatory Visit
Admission: RE | Admit: 2018-06-08 | Discharge: 2018-06-08 | Disposition: A | Discharge status: Home / Self Care | Payer: Medicare Other | Source: Ambulatory Visit | Attending: Internal Medicine | Admitting: Internal Medicine

## 2018-06-08 ENCOUNTER — Other Ambulatory Visit: Payer: Self-pay | Admitting: Internal Medicine

## 2018-06-08 DIAGNOSIS — K7689 Other specified diseases of liver: Secondary | ICD-10-CM | POA: Insufficient documentation

## 2018-06-08 DIAGNOSIS — I700 Atherosclerosis of aorta: Secondary | ICD-10-CM | POA: Diagnosis not present

## 2018-06-08 DIAGNOSIS — I259 Chronic ischemic heart disease, unspecified: Secondary | ICD-10-CM | POA: Diagnosis not present

## 2018-06-08 DIAGNOSIS — R0602 Shortness of breath: Secondary | ICD-10-CM

## 2018-06-08 LAB — POCT I-STAT CREATININE: Creatinine, Ser: 0.6 mg/dL (ref 0.44–1.00)

## 2018-06-08 MED ORDER — IOHEXOL 350 MG/ML SOLN
75.0000 mL | Freq: Once | INTRAVENOUS | Status: AC | PRN
  Administered 2018-06-08: 75 mL via INTRAVENOUS

## 2018-10-05 ENCOUNTER — Ambulatory Visit
Admission: RE | Admit: 2018-10-05 | Discharge: 2018-10-05 | Disposition: A | Discharge status: Home / Self Care | Payer: Medicare Other | Source: Ambulatory Visit | Attending: Obstetrics and Gynecology | Admitting: Obstetrics and Gynecology

## 2018-10-05 DIAGNOSIS — Z1231 Encounter for screening mammogram for malignant neoplasm of breast: Secondary | ICD-10-CM | POA: Diagnosis not present

## 2019-02-28 DIAGNOSIS — D239 Other benign neoplasm of skin, unspecified: Secondary | ICD-10-CM

## 2019-02-28 HISTORY — DX: Other benign neoplasm of skin, unspecified: D23.9

## 2019-03-08 DIAGNOSIS — E782 Mixed hyperlipidemia: Secondary | ICD-10-CM | POA: Insufficient documentation

## 2019-04-05 ENCOUNTER — Encounter: Payer: Self-pay | Admitting: General Surgery

## 2019-05-10 ENCOUNTER — Emergency Department: Payer: Medicare Other

## 2019-05-10 ENCOUNTER — Encounter: Payer: Self-pay | Admitting: Emergency Medicine

## 2019-05-10 ENCOUNTER — Other Ambulatory Visit: Payer: Self-pay

## 2019-05-10 DIAGNOSIS — Z79899 Other long term (current) drug therapy: Secondary | ICD-10-CM | POA: Insufficient documentation

## 2019-05-10 DIAGNOSIS — R109 Unspecified abdominal pain: Secondary | ICD-10-CM | POA: Diagnosis present

## 2019-05-10 DIAGNOSIS — N200 Calculus of kidney: Secondary | ICD-10-CM | POA: Insufficient documentation

## 2019-05-10 LAB — CBC
HCT: 42.1 % (ref 36.0–46.0)
Hemoglobin: 13.7 g/dL (ref 12.0–15.0)
MCH: 29.8 pg (ref 26.0–34.0)
MCHC: 32.5 g/dL (ref 30.0–36.0)
MCV: 91.5 fL (ref 80.0–100.0)
Platelets: 253 10*3/uL (ref 150–400)
RBC: 4.6 MIL/uL (ref 3.87–5.11)
RDW: 13.3 % (ref 11.5–15.5)
WBC: 12.5 10*3/uL — ABNORMAL HIGH (ref 4.0–10.5)
nRBC: 0 % (ref 0.0–0.2)

## 2019-05-10 LAB — URINALYSIS, COMPLETE (UACMP) WITH MICROSCOPIC
Bilirubin Urine: NEGATIVE
Glucose, UA: NEGATIVE mg/dL
Ketones, ur: NEGATIVE mg/dL
Leukocytes,Ua: NEGATIVE
Nitrite: NEGATIVE
Protein, ur: 100 mg/dL — AB
RBC / HPF: >50 RBC/hpf — ABNORMAL HIGH (ref 0–5)
Specific Gravity, Urine: 1.021 (ref 1.005–1.030)
Squamous Epithelial / LPF: NONE SEEN (ref 0–5)
pH: 5 (ref 5.0–8.0)

## 2019-05-10 LAB — BASIC METABOLIC PANEL
Anion gap: 8 (ref 5–15)
BUN: 18 mg/dL (ref 8–23)
CO2: 28 mmol/L (ref 22–32)
Calcium: 9 mg/dL (ref 8.9–10.3)
Chloride: 108 mmol/L (ref 98–111)
Creatinine, Ser: 0.73 mg/dL (ref 0.44–1.00)
GFR calc Af Amer: >60 mL/min (ref >60)
GFR calc non Af Amer: >60 mL/min (ref >60)
Glucose, Bld: 125 mg/dL — ABNORMAL HIGH (ref 70–99)
Potassium: 3.9 mmol/L (ref 3.5–5.1)
Sodium: 144 mmol/L (ref 135–145)

## 2019-05-10 MED ORDER — OXYCODONE-ACETAMINOPHEN 5-325 MG PO TABS
1.0000 | ORAL_TABLET | ORAL | Status: DC | PRN
  Administered 2019-05-10: 20:00:00 1 via ORAL
  Filled 2019-05-10: qty 1

## 2019-05-10 NOTE — ED Triage Notes (Signed)
Pt in via POV, reports sudden onset left flank pain w/ N/V.  Hx kidney stones.  NAD noted at this time.

## 2019-05-11 ENCOUNTER — Emergency Department
Admission: EM | Admit: 2019-05-11 | Discharge: 2019-05-11 | Disposition: A | Discharge status: Home / Self Care | Payer: Medicare Other | Attending: Emergency Medicine | Admitting: Emergency Medicine

## 2019-05-11 DIAGNOSIS — N200 Calculus of kidney: Secondary | ICD-10-CM

## 2019-05-11 MED ORDER — DULCOLAX 5 MG PO TBEC: 5.0000 mg | DELAYED_RELEASE_TABLET | Freq: Every day | ORAL | 1 refills | Status: AC | PRN

## 2019-05-11 MED ORDER — ONDANSETRON HCL 4 MG PO TABS: 4.0000 mg | ORAL_TABLET | Freq: Three times a day (TID) | ORAL | 0 refills | Status: DC | PRN

## 2019-05-11 MED ORDER — CEPHALEXIN 500 MG PO CAPS: 500.0000 mg | ORAL_CAPSULE | Freq: Three times a day (TID) | ORAL | 0 refills | Status: AC

## 2019-05-11 MED ORDER — OXYCODONE-ACETAMINOPHEN 5-325 MG PO TABS: 1.0000 | ORAL_TABLET | Freq: Four times a day (QID) | ORAL | 0 refills | Status: AC | PRN

## 2019-05-11 MED ORDER — OXYCODONE-ACETAMINOPHEN 5-325 MG PO TABS
1.0000 | ORAL_TABLET | Freq: Once | ORAL | Status: AC
  Administered 2019-05-11: 1 via ORAL
  Filled 2019-05-11: qty 1

## 2019-05-11 NOTE — Discharge Instructions (Addendum)
Please seek medical attention for any high fevers, chest pain, shortness of breath, change in behavior, persistent vomiting, bloody stool or any other new or concerning symptoms.  

## 2019-05-11 NOTE — ED Notes (Signed)
Dr. Archie Balboa at the bedside for pt evaluation. No distress noted.

## 2019-05-11 NOTE — ED Provider Notes (Signed)
[facility] Emergency Department Provider Note  ____________________________________________   I have reviewed the triage vital signs and the nursing notes.   HISTORY  Chief Complaint Flank Pain   History limited by: Not Limited   HPI Darlene Hubbard is a 68 y.o. female who presents to the emergency department today because of concerns for left flank and left lower quadrant pain.  Patient states the pain started today while she was a passenger in a car.  She states that initially she had the sensation so she would have to use the bathroom very quickly.  She did develop significant nausea with vomiting.  States she vomited maybe 25 times.  The pain did become severe.  Patient states she did have a kidney stone however it was roughly 20 years ago.  Patient denies any fevers.  At the time of my exam the patient was feeling better.    Records reviewed. Per medical record review patient has a history of IBS, asthma.   Past Medical History:  Diagnosis Date  . Allergy   . Asthma   . Bursitis    BOTH SHOULDER AND HIPS  . Fibromyalgia   . GERD (gastroesophageal reflux disease)   . Hiatal hernia   . IBS (irritable bowel syndrome)   . Mild obstructive sleep apnea   . Morbid obesity ([place])   . Spinal stenosis   . Varicose veins     Patient Active Problem List   Diagnosis Date Noted  . Varicose veins with pain 12/17/2016  . Chronic venous insufficiency 12/17/2016  . Leg pain 12/17/2016  . Left patella fracture 06/03/2016  . Family history of colon cancer 12/12/2015    Past Surgical History:  Procedure Laterality Date  . ABDOMINAL HYSTERECTOMY  1993  . APPENDECTOMY  1995  . BACK SURGERY    . BREAST BIOPSY Left 2000   neg  . CHOLECYSTECTOMY    . COLONOSCOPY  2008  . COLONOSCOPY WITH PROPOFOL N/A 02/18/2016   Procedure: COLONOSCOPY WITH PROPOFOL;  Surgeon: [name], MD;  Location: [facility] ENDOSCOPY;  Service: Endoscopy;  Laterality: N/A;  . ORIF  PATELLA Left 06/02/2016   Procedure: OPEN REDUCTION INTERNAL (ORIF) FIXATION PATELLA;  Surgeon: Dereck Leep, MD;  Location: [facility] ORS;  Service: Orthopedics;  Laterality: Left;  . PARTIAL HYSTERECTOMY    . PATELLAR TENDON REPAIR Left 06/02/2016   Procedure: PATELLA TENDON REPAIR;  Surgeon: Dereck Leep, MD;  Location: [facility] ORS;  Service: Orthopedics;  Laterality: Left;  . TUBAL LIGATION    . UPPER GI ENDOSCOPY  2008  . vein closure procedure Bilateral 2010    Prior to Admission medications   Medication Sig Start Date End Date Taking? Authorizing Provider  ALPRAZolam Duanne Moron) 0.5 MG tablet  11/03/15   [provider]  aspirin EC 81 MG tablet Take 81 mg by mouth daily.    [provider]  cetirizine (ZYRTEC) 10 MG tablet Take by mouth.    [provider]  Cholecalciferol (VITAMIN D-3 PO) Take 2,000 Int'l Units by mouth.     [provider]  dicyclomine (BENTYL) 20 MG tablet Take 1 tablet (20 mg total) by mouth 3 (three) times daily as needed (abdominal pain). Patient not taking: Reported on 12/16/2016 06/27/16   Nance Pear, MD  Fluticasone-Salmeterol (ADVAIR) 100-50 MCG/DOSE AEPB Inhale 1 puff into the lungs 2 (two) times daily.    [provider]  gabapentin (NEURONTIN) 300 MG capsule Take 300 mg by mouth 2 (two)  times daily.  05/19/15   [provider]  montelukast (SINGULAIR) 10 MG tablet Take 10 mg by mouth daily.    [provider]  Multiple Vitamin (MULTI-VITAMINS) TABS Take by mouth.    [provider]  ondansetron (ZOFRAN) 4 MG tablet Take 1 tablet (4 mg total) by mouth every 8 (eight) hours as needed for nausea or vomiting. 06/27/16   Nance Pear, MD  oxyCODONE (OXY IR/ROXICODONE) 5 MG immediate release tablet Take 1-2 tablets (5-10 mg total) by mouth every 4 (four) hours as needed for breakthrough pain ((for MODERATE breakthrough pain)). Patient not taking: Reported on 12/16/2016 06/04/16   Watt Climes, PA   polyethylene glycol powder [facility]) powder 255 grams one bottle for colonoscopy prep Patient not taking: Reported on 06/02/2016 12/11/15   [name], MD  sertraline (ZOLOFT) 50 MG tablet Take by mouth.    [provider]  traMADol (ULTRAM) 50 MG tablet Take 1-2 tablets (50-100 mg total) by mouth every 4 (four) hours as needed for moderate pain. 06/04/16   Watt Climes, PA    Allergies Molds & smuts, Diovan [valsartan], Penicillins, and Sulfa drugs cross reactors  Family History  Problem Relation Age of Onset  . Colon cancer Father 63  . Thrombocytopenia Mother 33  . Other Sister        Bone Marrow disorder  . Breast cancer Neg Hx     Social History Social History   Tobacco Use  . Smoking status: Never Smoker  . Smokeless tobacco: Never Used  Substance Use Topics  . Alcohol use: No  . Drug use: No    Review of Systems Constitutional: No fever/chills Eyes: No visual changes. ENT: No sore throat. Cardiovascular: Denies chest pain. Respiratory: Denies shortness of breath. Gastrointestinal: Positive for left flank pain and left lower quadrant pain. Genitourinary: Negative for dysuria. Musculoskeletal: Negative for back pain. Skin: Negative for rash. Neurological: Negative for headaches, focal weakness or numbness.  ____________________________________________   PHYSICAL EXAM:  VITAL SIGNS: ED Triage Vitals  Enc Vitals Group     BP 05/10/19 2014 (!) 151/81     Pulse Rate 05/10/19 2014 81     Resp 05/10/19 2014 16     Temp 05/10/19 2014 98.2 F (36.8 C)     Temp Source 05/10/19 2014 Oral     SpO2 05/10/19 2014 97 %     Weight 05/10/19 2015 167 lb (75.8 kg)     Height 05/10/19 2015 5\' 3"  (1.6 m)     Head Circumference --      Peak Flow --      Pain Score 05/10/19 2015 8   Constitutional: Alert and oriented.  Eyes: Conjunctivae are normal.  ENT      Head: Normocephalic and atraumatic.      Nose: No congestion/rhinnorhea.       Mouth/Throat: Mucous membranes are moist.      Neck: No stridor. Hematological/Lymphatic/Immunilogical: No cervical lymphadenopathy. Cardiovascular: Normal rate, regular rhythm.  No murmurs, rubs, or gallops.  Respiratory: Normal respiratory effort without tachypnea nor retractions. Breath sounds are clear and equal bilaterally. No wheezes/rales/rhonchi. Gastrointestinal: Soft and non tender. No rebound. No guarding.  Genitourinary: Deferred Musculoskeletal: Normal range of motion in all extremities. No lower extremity edema. Neurologic:  Normal speech and language. No gross focal neurologic deficits are appreciated.  Skin:  Skin is warm, dry and intact. No rash noted. Psychiatric: Mood and affect are normal. Speech and behavior are normal. Patient exhibits appropriate  insight and judgment.  ____________________________________________    LABS (pertinent positives/negatives)  BMP wnl except glu 125 CBC wbc 12.5, hgb 13.7, plt 253 UA hgb dipstick large, protein 100, negative leukocytes, >50 rbc, 6-10 wbc, few bacteria ____________________________________________   EKG  None  ____________________________________________    RADIOLOGY  CT renal 4 mm left distal ureteral stone  ____________________________________________   PROCEDURES  Procedures  ____________________________________________   INITIAL IMPRESSION / ASSESSMENT AND PLAN / ED COURSE  Pertinent labs & imaging results that were available during my care of the patient were reviewed by me and considered in my medical decision making (see chart for details).   Patient presented to the emergency department today because of concerns for left flank and left lower quadrant pain.  Patient's urine had significant blood in it.  CT renal was performed which showed a 4 mm stone in the left distal ureter.  I do think this could explain the patient's symptoms.  Discussed this finding with the patient.  Discussed care.   Discussed return precautions.  ____________________________________________   FINAL CLINICAL IMPRESSION(S) / ED DIAGNOSES  Final diagnoses:  Kidney stone     Note: This dictation was prepared with Dragon dictation. Any transcriptional errors that result from this process are unintentional     Nance Pear, MD 05/11/19 [id number]

## 2019-05-23 ENCOUNTER — Other Ambulatory Visit: Payer: Self-pay

## 2019-05-23 ENCOUNTER — Ambulatory Visit: Payer: Self-pay | Admitting: Urology

## 2019-05-23 ENCOUNTER — Encounter
Admission: RE | Admit: 2019-05-23 | Discharge: 2019-05-23 | Disposition: A | Discharge status: Home / Self Care | Payer: Medicare Other | Source: Ambulatory Visit | Attending: Urology | Admitting: Urology

## 2019-05-23 DIAGNOSIS — Z20828 Contact with and (suspected) exposure to other viral communicable diseases: Secondary | ICD-10-CM | POA: Diagnosis not present

## 2019-05-23 DIAGNOSIS — Z01818 Encounter for other preprocedural examination: Secondary | ICD-10-CM | POA: Diagnosis present

## 2019-05-23 LAB — SARS CORONAVIRUS 2 (TAT 6-24 HRS): SARS Coronavirus 2: NEGATIVE

## 2019-05-23 MED ORDER — CEFAZOLIN SODIUM-DEXTROSE 1-4 GM/50ML-% IV SOLN
1.0000 g | Freq: Once | INTRAVENOUS | Status: AC
  Administered 2019-05-24: 1 g via INTRAVENOUS

## 2019-05-23 NOTE — Patient Instructions (Signed)
Your procedure is scheduled on: May 24, 2019 THURSDAY Report to Day Surgery on the 2nd floor of the [facility]. To find out your arrival time, please call [phone] between 1PM - 3PM on: May 23, 2019  REMEMBER: Instructions that are not followed completely may result in serious medical risk, up to and including death; or upon the discretion of your surgeon and anesthesiologist your surgery may need to be rescheduled.  Do not eat food after midnight the night before surgery.  No gum chewing, lozengers or hard candies.  You may however, drink CLEAR liquids up to 2 hours before you are scheduled to arrive for your surgery. Do not drink anything within 2 hours of the start of your surgery.  Clear liquids include: - water  - apple juice without pulp - CLEAR gatorade - black coffee or tea (Do NOT add milk or creamers to the coffee or tea) Do NOT drink anything that is not on this list.  Type 1 and Type 2 diabetics should only drink water.  No Alcohol for 24 hours before or after surgery.  No Smoking including e-cigarettes for 24 hours prior to surgery.  No chewable tobacco products for at least 6 hours prior to surgery.  No nicotine patches on the day of surgery.  On the morning of surgery brush your teeth with toothpaste and water, you may rinse your mouth with mouthwash if you wish. Do not swallow any toothpaste or mouthwash.  Notify your doctor if there is any change in your medical condition (cold, fever, infection).  Do not wear jewelry, make-up, hairpins, clips or nail polish.  Do not wear lotions, powders, or perfumes.   Do not shave 48 hours prior to surgery.   Contacts and dentures may not be worn into surgery.  Do not bring valuables to the hospital, including drivers license, insurance or credit cards.  [facility] is not responsible for any belongings or valuables.   TAKE THESE MEDICATIONS THE MORNING OF SURGERY: ZYRTEC IF NEEDED  TAKE A SHOWER  THE DAY OF SURGERY.  Use inhalers on the day of surgery   Stop Anti-inflammatories (NSAIDS) such as Advil, Aleve, Ibuprofen, Motrin, Naproxen, Naprosyn and Aspirin based products such as Excedrin, Goodys Powder, BC Powder. (May take Tylenol or Acetaminophen if needed.)  Stop ANY OVER THE COUNTER supplements until after surgery. (May continue Vitamin D, Vitamin B, and multivitamin.)  Wear comfortable clothing (specific to your surgery type) to the hospital.  Plan for stool softeners for home use.  If you are being discharged the day of surgery, you will not be allowed to drive home. You will need a responsible adult to drive you home and stay with you that night.   If you are taking public transportation, you will need to have a responsible adult with you. Please confirm with your physician that it is acceptable to use public transportation.   Please call [phone] if you have any questions about these instructions.

## 2019-05-24 ENCOUNTER — Encounter: Payer: Self-pay | Admitting: *Deleted

## 2019-05-24 ENCOUNTER — Ambulatory Visit: Payer: Medicare Other

## 2019-05-24 ENCOUNTER — Ambulatory Visit
Admission: RE | Admit: 2019-05-24 | Discharge: 2019-05-24 | Disposition: A | Discharge status: Home / Self Care | Payer: Medicare Other | Attending: Urology | Admitting: Urology

## 2019-05-24 ENCOUNTER — Ambulatory Visit: Payer: Medicare Other | Admitting: Anesthesiology

## 2019-05-24 ENCOUNTER — Other Ambulatory Visit: Payer: Self-pay

## 2019-05-24 ENCOUNTER — Encounter
Admission: RE | Disposition: A | Discharge status: Home / Self Care | Payer: Self-pay | Source: Home / Self Care | Attending: Urology

## 2019-05-24 DIAGNOSIS — Z79899 Other long term (current) drug therapy: Secondary | ICD-10-CM | POA: Diagnosis not present

## 2019-05-24 DIAGNOSIS — Z882 Allergy status to sulfonamides status: Secondary | ICD-10-CM | POA: Diagnosis not present

## 2019-05-24 DIAGNOSIS — Z888 Allergy status to other drugs, medicaments and biological substances status: Secondary | ICD-10-CM | POA: Diagnosis not present

## 2019-05-24 DIAGNOSIS — N132 Hydronephrosis with renal and ureteral calculous obstruction: Secondary | ICD-10-CM | POA: Insufficient documentation

## 2019-05-24 DIAGNOSIS — N201 Calculus of ureter: Secondary | ICD-10-CM

## 2019-05-24 DIAGNOSIS — Z87442 Personal history of urinary calculi: Secondary | ICD-10-CM | POA: Diagnosis not present

## 2019-05-24 DIAGNOSIS — J45909 Unspecified asthma, uncomplicated: Secondary | ICD-10-CM | POA: Insufficient documentation

## 2019-05-24 DIAGNOSIS — G4733 Obstructive sleep apnea (adult) (pediatric): Secondary | ICD-10-CM | POA: Insufficient documentation

## 2019-05-24 HISTORY — PX: CYSTOSCOPY W/ RETROGRADES: SHX1426

## 2019-05-24 HISTORY — PX: URETEROSCOPY WITH HOLMIUM LASER LITHOTRIPSY: SHX6645

## 2019-05-24 SURGERY — CYSTOSCOPY, WITH RETROGRADE PYELOGRAM
Anesthesia: General | Laterality: Left

## 2019-05-24 MED ORDER — LIDOCAINE HCL (CARDIAC) PF 100 MG/5ML IV SOSY
PREFILLED_SYRINGE | INTRAVENOUS | Status: DC | PRN
  Administered 2019-05-24: 100 mg via INTRAVENOUS

## 2019-05-24 MED ORDER — KETOROLAC TROMETHAMINE 30 MG/ML IJ SOLN
INTRAMUSCULAR | Status: AC
  Filled 2019-05-24: qty 1

## 2019-05-24 MED ORDER — LACTATED RINGERS IV SOLN
INTRAVENOUS | Status: DC | PRN
  Administered 2019-05-24: 10:00:00 via INTRAVENOUS

## 2019-05-24 MED ORDER — FENTANYL CITRATE (PF) 100 MCG/2ML IJ SOLN
INTRAMUSCULAR | Status: AC
  Filled 2019-05-24: qty 2

## 2019-05-24 MED ORDER — FENTANYL CITRATE (PF) 100 MCG/2ML IJ SOLN
INTRAMUSCULAR | Status: DC | PRN
  Administered 2019-05-24 (×2): 50 ug via INTRAVENOUS

## 2019-05-24 MED ORDER — MIDAZOLAM HCL 2 MG/2ML IJ SOLN
INTRAMUSCULAR | Status: DC | PRN
  Administered 2019-05-24: 2 mg via INTRAVENOUS

## 2019-05-24 MED ORDER — OXYCODONE HCL 5 MG PO TABS: 5.0000 mg | ORAL_TABLET | Freq: Once | ORAL | Status: DC | PRN

## 2019-05-24 MED ORDER — OXYCODONE HCL 5 MG/5ML PO SOLN: 5.0000 mg | Freq: Once | ORAL | Status: DC | PRN

## 2019-05-24 MED ORDER — TAMSULOSIN HCL 0.4 MG PO CAPS: 0.4000 mg | ORAL_CAPSULE | Freq: Every day | ORAL | 1 refills | Status: DC

## 2019-05-24 MED ORDER — FAMOTIDINE 20 MG PO TABS
20.0000 mg | ORAL_TABLET | Freq: Once | ORAL | Status: AC
  Administered 2019-05-24: 20 mg via ORAL

## 2019-05-24 MED ORDER — FUROSEMIDE 10 MG/ML IJ SOLN
INTRAMUSCULAR | Status: DC | PRN
  Administered 2019-05-24: 10 mg via INTRAMUSCULAR

## 2019-05-24 MED ORDER — CEFAZOLIN SODIUM-DEXTROSE 1-4 GM/50ML-% IV SOLN
INTRAVENOUS | Status: AC
  Filled 2019-05-24: qty 50

## 2019-05-24 MED ORDER — MIDAZOLAM HCL 2 MG/2ML IJ SOLN
INTRAMUSCULAR | Status: AC
  Filled 2019-05-24: qty 2

## 2019-05-24 MED ORDER — CLOTRIMAZOLE-BETAMETHASONE 1-0.05 % EX CREA: TOPICAL_CREAM | CUTANEOUS | 3 refills | Status: AC

## 2019-05-24 MED ORDER — CIPROFLOXACIN HCL 500 MG PO TABS: 500.0000 mg | ORAL_TABLET | Freq: Two times a day (BID) | ORAL | 0 refills | Status: DC

## 2019-05-24 MED ORDER — ONDANSETRON 8 MG PO TBDP: 8.0000 mg | ORAL_TABLET | Freq: Four times a day (QID) | ORAL | 3 refills | Status: DC | PRN

## 2019-05-24 MED ORDER — PHENYLEPHRINE HCL (PRESSORS) 10 MG/ML IV SOLN
INTRAVENOUS | Status: DC | PRN
  Administered 2019-05-24 (×4): 100 ug via INTRAVENOUS

## 2019-05-24 MED ORDER — LIDOCAINE HCL URETHRAL/MUCOSAL 2 % EX GEL
CUTANEOUS | Status: AC
  Filled 2019-05-24: qty 10

## 2019-05-24 MED ORDER — PROPOFOL 10 MG/ML IV BOLUS
INTRAVENOUS | Status: DC | PRN
  Administered 2019-05-24: 150 mg via INTRAVENOUS

## 2019-05-24 MED ORDER — LACTATED RINGERS IV SOLN
INTRAVENOUS | Status: DC
  Administered 2019-05-24: 10:00:00 via INTRAVENOUS

## 2019-05-24 MED ORDER — BELLADONNA ALKALOIDS-OPIUM 16.2-60 MG RE SUPP
RECTAL | Status: DC | PRN
  Administered 2019-05-24: 1 via RECTAL

## 2019-05-24 MED ORDER — BELLADONNA ALKALOIDS-OPIUM 16.2-60 MG RE SUPP
RECTAL | Status: AC
  Filled 2019-05-24: qty 1

## 2019-05-24 MED ORDER — FENTANYL CITRATE (PF) 100 MCG/2ML IJ SOLN: 25.0000 ug | INTRAMUSCULAR | Status: DC | PRN

## 2019-05-24 MED ORDER — DEXAMETHASONE SODIUM PHOSPHATE 10 MG/ML IJ SOLN
INTRAMUSCULAR | Status: DC | PRN
  Administered 2019-05-24: 10 mg via INTRAVENOUS

## 2019-05-24 MED ORDER — LIDOCAINE HCL URETHRAL/MUCOSAL 2 % EX GEL
CUTANEOUS | Status: DC | PRN
  Administered 2019-05-24: 1

## 2019-05-24 MED ORDER — FAMOTIDINE 20 MG PO TABS
ORAL_TABLET | ORAL | Status: AC
  Filled 2019-05-24: qty 1

## 2019-05-24 MED ORDER — ONDANSETRON HCL 4 MG/2ML IJ SOLN
INTRAMUSCULAR | Status: DC | PRN
  Administered 2019-05-24: 4 mg via INTRAVENOUS

## 2019-05-24 MED ORDER — IOHEXOL 180 MG/ML  SOLN
INTRAMUSCULAR | Status: DC | PRN
  Administered 2019-05-24: 12 mL

## 2019-05-24 SURGICAL SUPPLY — 26 items
BAG DRAIN CYSTO-URO LG1000N (MISCELLANEOUS) ×2 IMPLANT
BRUSH SCRUB EZ 1% IODOPHOR (MISCELLANEOUS) ×2 IMPLANT
CATH URETL 5X70 OPEN END (CATHETERS) ×2 IMPLANT
CNTNR SPEC 2.5X3XGRAD LEK (MISCELLANEOUS)
CONT SPEC 4OZ STER OR WHT (MISCELLANEOUS)
CONT SPEC 4OZ STRL OR WHT (MISCELLANEOUS)
CONTAINER SPEC 2.5X3XGRAD LEK (MISCELLANEOUS) IMPLANT
COVER WAND RF STERILE (DRAPES) ×2 IMPLANT
FIBER LASER FLEXIVA 365 (UROLOGICAL SUPPLIES) ×1 IMPLANT
GLOVE BIO SURGEON STRL SZ7.5 (GLOVE) ×2 IMPLANT
GOWN STRL REUS W/ TWL LRG LVL3 (GOWN DISPOSABLE) ×1 IMPLANT
GOWN STRL REUS W/ TWL LRG LVL4 (GOWN DISPOSABLE) ×1 IMPLANT
GOWN STRL REUS W/ TWL XL LVL3 (GOWN DISPOSABLE) ×1 IMPLANT
GOWN STRL REUS W/TWL LRG LVL3 (GOWN DISPOSABLE) ×1
GOWN STRL REUS W/TWL LRG LVL4 (GOWN DISPOSABLE) ×2
GOWN STRL REUS W/TWL XL LVL3 (GOWN DISPOSABLE) ×2
GUIDEWIRE STR ZIPWIRE 035X150 (MISCELLANEOUS) ×2 IMPLANT
KIT TURNOVER CYSTO (KITS) ×2 IMPLANT
PACK CYSTO AR (MISCELLANEOUS) ×2 IMPLANT
SET CYSTO W/LG BORE CLAMP LF (SET/KITS/TRAYS/PACK) ×2 IMPLANT
SOL .9 NS 3000ML IRR  AL (IV SOLUTION) ×1
SOL .9 NS 3000ML IRR AL (IV SOLUTION) ×1
SOL .9 NS 3000ML IRR UROMATIC (IV SOLUTION) ×1 IMPLANT
SURGILUBE 2OZ TUBE FLIPTOP (MISCELLANEOUS) ×2 IMPLANT
SYR 10ML LL (SYRINGE) ×2 IMPLANT
WATER STERILE IRR 1000ML POUR (IV SOLUTION) ×2 IMPLANT

## 2019-05-24 NOTE — Transfer of Care (Signed)
Immediate Anesthesia Transfer of Care Note  Patient: Darlene Hubbard  Procedure(s) Performed: CYSTOSCOPY WITH RETROGRADE PYELOGRAM (Left ) URETEROSCOPY WITH HOLMIUM LASER LITHOTRIPSY (Left )  Patient Location: PACU  Anesthesia Type:General  Level of Consciousness: sedated  Airway & Oxygen Therapy: Patient Spontanous Breathing and Patient connected to face mask oxygen  Post-op Assessment: Report given to RN and Post -op Vital signs reviewed and stable  Post vital signs: Reviewed and stable  Last Vitals:  Vitals Value Taken Time  BP    Temp    Pulse    Resp    SpO2      Last Pain:  Vitals:   05/24/19 0936  TempSrc: Temporal  PainSc: 0-No pain         Complications: No apparent anesthesia complications

## 2019-05-24 NOTE — Op Note (Signed)
Preoperative diagnosis: 1.  Left ureterolithiasis                                             2.  Right nephrolithiasis  Postoperative diagnosis: 1.  Same                                              2.  Left hydronephrosis  Procedure: 1.  Left ureteroscopic ureterolithotomy with holmium laser lithotripsy                      2.  Bilateral retrograde pyelography                      3.  Fluoroscopy   Surgeon: Otelia Limes. Yves Dill MD  Anesthesia: General  Indications:See the history and physical. After informed consent the above procedure(s) were requested     Technique and findings: After adequate general anesthesia been obtained the patient was placed into dorsal lithotomy position and the perineum was prepped and draped in the usual fashion.  Fluoroscopy was performed revealing a 8 mm right renal calculus.  A definite ureteral calculus was not identified on the left side.  Was a midline calcification present in the pelvis.  At this point the 21 French cystoscope was coupled the camera and placed into the bladder.  The bladder was thoroughly inspected and no bladder mucosal lesions were identified.  Both ureteral orifices were identified and normally situated on the trigone.  At this point a 6 [place] open-ended ureteral catheter was placed into the left ureteral orifice and retrograde pyelography was performed.  Hydronephrosis was identified to the level of the distal left ureter.  Right retrograde pyelography was then performed and no hydronephrosis or filling defects were identified.  The cystoscope was then removed and the short mini Stortz rigid ureteroscope coupled the camera and advanced into the distal left ureter.  A 7 mm stone was identified.  The 365 m holmium laser fiber was then passed through the scope and set at frequency of 10 and power of 0.5 J.  Stone was then dusted.  The scope was then advanced beyond the level of the stone into the proximal left ureter and no other stones  identified.  Ureteroscope was then removed.  10 cc of viscous Xylocaine was instilled within the urethra and a B&O suppository placed.  Procedure was then terminated and patient transferred to the recovery room in stable condition.

## 2019-05-24 NOTE — Anesthesia Procedure Notes (Signed)
Procedure Name: LMA Insertion Date/Time: 05/24/2019 10:31 AM Performed by: Justus Memory, CRNA Pre-anesthesia Checklist: Patient identified, Patient being monitored, Timeout performed, Emergency Drugs available and Suction available Patient Re-evaluated:Patient Re-evaluated prior to induction Oxygen Delivery Method: Circle system utilized Preoxygenation: Pre-oxygenation with 100% oxygen Induction Type: IV induction Ventilation: Mask ventilation without difficulty LMA: LMA inserted LMA Size: 3.5 Tube type: Oral Number of attempts: 1 Placement Confirmation: positive ETCO2 and breath sounds checked- equal and bilateral Tube secured with: Tape Dental Injury: Teeth and Oropharynx as per pre-operative assessment

## 2019-05-24 NOTE — Anesthesia Preprocedure Evaluation (Signed)
Anesthesia Evaluation  Patient identified by MRN, date of birth, ID band Patient awake    Reviewed: Allergy & Precautions, H&P , NPO status , Patient's Chart, lab work & pertinent test results  History of Anesthesia Complications Negative for: history of anesthetic complications  Airway Mallampati: III  TM Distance: <3 FB Neck ROM: full    Dental  (+) Chipped   Pulmonary neg shortness of breath, asthma , sleep apnea ,           Cardiovascular Exercise Tolerance: Good (-) angina(-) Past MI and (-) DOE negative cardio ROS       Neuro/Psych  Neuromuscular disease negative psych ROS   GI/Hepatic Neg liver ROS, hiatal hernia, GERD  Medicated and Controlled,  Endo/Other  negative endocrine ROS  Renal/GU      Musculoskeletal  (+) Fibromyalgia -  Abdominal   Peds  Hematology negative hematology ROS (+)   Anesthesia Other Findings Past Medical History: No date: Allergy No date: Asthma No date: Bursitis     Comment:  BOTH SHOULDER AND HIPS No date: Fibromyalgia No date: GERD (gastroesophageal reflux disease) No date: Hiatal hernia No date: IBS (irritable bowel syndrome) No date: Mild obstructive sleep apnea     Comment:  does not use Cpap No date: Morbid obesity ([place]) No date: Spinal stenosis No date: Varicose veins  Past Surgical History: 1993: ABDOMINAL HYSTERECTOMY 1995: APPENDECTOMY No date: BACK SURGERY 2000: BREAST BIOPSY; Left     Comment:  neg No date: CHOLECYSTECTOMY 2008: COLONOSCOPY 02/18/2016: COLONOSCOPY WITH PROPOFOL; N/A     Comment:  Procedure: COLONOSCOPY WITH PROPOFOL;  Surgeon: [name], MD;  Location: [facility] ENDOSCOPY;  Service:               Endoscopy;  Laterality: N/A; 06/02/2016: ORIF PATELLA; Left     Comment:  Procedure: OPEN REDUCTION INTERNAL (ORIF) FIXATION               PATELLA;  Surgeon: Dereck Leep, MD;  Location: [facility]               ORS;  Service:  Orthopedics;  Laterality: Left; No date: PARTIAL HYSTERECTOMY 06/02/2016: PATELLAR TENDON REPAIR; Left     Comment:  Procedure: PATELLA TENDON REPAIR;  Surgeon: Dereck Leep, MD;  Location: [facility] ORS;  Service: Orthopedics;                Laterality: Left; No date: TUBAL LIGATION 2008: UPPER GI ENDOSCOPY 2010: vein closure procedure; Bilateral  BMI    Body Mass Index: 28.90 kg/m      Reproductive/Obstetrics negative OB ROS                             Anesthesia Physical Anesthesia Plan  ASA: III  Anesthesia Plan: General LMA   Post-op Pain Management:    Induction: Intravenous  PONV Risk Score and Plan: Dexamethasone, Ondansetron, Midazolam and Treatment may vary due to age or medical condition  Airway Management Planned: LMA  Additional Equipment:   Intra-op Plan:   Post-operative Plan: Extubation in OR  Informed Consent: I have reviewed the patients History and Physical, chart, labs and discussed the procedure including the risks, benefits and alternatives for the proposed anesthesia with the patient or authorized representative who has indicated his/her  understanding and acceptance.     Dental Advisory Given  Plan Discussed with: Anesthesiologist, CRNA and Surgeon  Anesthesia Plan Comments: (Patient consented for risks of anesthesia including but not limited to:  - adverse reactions to medications - damage to teeth, lips or other oral mucosa - sore throat or hoarseness - Damage to heart, brain, lungs or loss of life  Patient voiced understanding.)        Anesthesia Quick Evaluation

## 2019-05-24 NOTE — Anesthesia Postprocedure Evaluation (Signed)
Anesthesia Post Note  Patient: Darlene Hubbard  Procedure(s) Performed: CYSTOSCOPY WITH RETROGRADE PYELOGRAM (Left ) URETEROSCOPY WITH HOLMIUM LASER LITHOTRIPSY (Left )  Patient location during evaluation: PACU Anesthesia Type: General Level of consciousness: awake and alert Pain management: pain level controlled Vital Signs Assessment: post-procedure vital signs reviewed and stable Respiratory status: spontaneous breathing, nonlabored ventilation, respiratory function stable and patient connected to nasal cannula oxygen Cardiovascular status: blood pressure returned to baseline and stable Postop Assessment: no apparent nausea or vomiting Anesthetic complications: no     Last Vitals:  Vitals:   05/24/19 1131 05/24/19 1142  BP: 102/68 109/78  Pulse:  77  Resp:    Temp:  36.9 C  SpO2: 97% 95%    Last Pain:  Vitals:   05/24/19 1142  TempSrc:   PainSc: 0-No pain                 Precious Haws Piscitello

## 2019-05-24 NOTE — Discharge Instructions (Addendum)
Laser Therapy for Kidney Stones, Care After This sheet gives you information about how to care for yourself after your procedure. Your health care provider may also give you more specific instructions. If you have problems or questions, contact your health care provider. What can I expect after the procedure? After the procedure, it is common to have:  Pain.  A burning sensation while urinating.  Small amounts of blood in your urine.  A need to urinate frequently.  Pieces of kidney stone in your urine.  Mild discomfort when urinating that may be felt in the back. You may experience this if you have a flexible tube (stent) in your ureter. Follow these instructions at home:  Medicines  Take over-the-counter and prescription medicines only as told by your health care provider.  If you were prescribed an antibiotic medicine, take it as told by your health care provider. Do not stop taking the antibiotic even if you start to feel better.  Ask your health care provider if the medicine prescribed to you: ? Requires you to avoid driving or using heavy machinery. ? Can cause constipation. You may need to take actions to prevent or treat constipation, such as:  Take over-the-counter or prescription medicines.  Eat foods that are high in fiber, such as beans, whole grains, and fresh fruits and vegetables.  Limit foods that are high in fat and processed sugars, such as fried or sweet foods. Activity  Return to your normal activities as told by your health care provider. Ask your health care provider what activities are safe for you.  Do not drive for 24 hours if you were given a sedative during your procedure. General instructions  If your health care provider approves, you may take a warm bath to ease discomfort and burning.  Drink enough fluid to keep your urine pale yellow. Your health care provider may recommend drinking two 8 oz (237 mL) glasses of water per hour for a few hours  after your procedure.  You may be asked to strain your urine to collect any stone fragments that you pass. These fragments may be tested.  Keep all follow-up visits as told by your health care provider. This is important. If you have a stent, you will need to return to your health care provider to have the stent removed. Contact a health care provider if you:  Have pain or a burning feeling that lasts more than 2 days.  Feel nauseous.  Vomit more and more often.  Have difficulty urinating.  Have pain that gets worse or does not get better with medicine. Get help right away if:  You are unable to urinate, even if your bladder feels full.  You have: ? Bright red blood or blood clots in your urine. ? More blood in your urine. ? Severe pain or discomfort. ? A fever or shaking chills. ? Abdominal pain. ? Difficulty breathing. ? Swelling in your legs. Summary  After the procedure, it is common to have a burning sensation while urinating and small amounts of blood in your urine.  Take over-the-counter and prescription medicines only as told by your health care provider.  Drink enough fluid to keep your urine pale yellow.  Keep all follow-up visits as told by your health care provider. This is important. This information is not intended to replace advice given to you by your health care provider. Make sure you discuss any questions you have with your health care provider. Document Released: 09/12/2015 Document Revised: 04/27/2018 Document  Reviewed: 04/27/2018 Elsevier Patient Education  [place] After This sheet gives you information about how to care for yourself after your procedure. Your health care provider may also give you more specific instructions. If you have problems or questions, contact your health care provider. What can I expect after the procedure? After the procedure, it is common to have:  A burning sensation when you  urinate.  Blood in your urine.  Mild discomfort in the bladder area or kidney area when urinating.  Needing to urinate more often or urgently. Follow these instructions at home:  Medicines  Take over-the-counter and prescription medicines only as told by your health care provider.  If you were prescribed an antibiotic medicine, take it as told by your health care provider. Do not stop taking the antibiotic even if you start to feel better. General instructions  Donot drive for 24 hours if you were given a medicine to help you relax (sedative) during your procedure.  To relieve burning, try taking a warm bath or holding a warm washcloth over your groin.  Drink enough fluid to keep your urine clear or pale yellow. ? Drink two 8-ounce glasses of water every hour for the first 2 hours after you get home. ? Continue to drink water often at home.  You can eat what you usually do.  Keep all follow-up visits as told by your health care provider. This is important. ? If you had a tube placed to keep urine flowing (ureteral stent), ask your health care provider when you need to return to have it removed. Contact a health care provider if:  You have chills or a fever.  You have burning pain for longer than 24 hours after the procedure.  You have blood in your urine for longer than 24 hours after the procedure. Get help right away if:  You have large amounts of blood in your urine.  You have blood clots in your urine.  You have very bad pain.  You have chest pain or trouble breathing.  You are unable to urinate and you have the feeling of a full bladder. This information is not intended to replace advice given to you by your health care provider. Make sure you discuss any questions you have with your health care provider. Document Released: 08/21/2013 Document Revised: 07/29/2017 Document Reviewed: 05/28/2016 Elsevier Patient Education  2020 [place]   1) The drugs that you were given will stay in your system until tomorrow so for the next 24 hours you should not:  A) Drive an automobile B) Make any legal decisions C) Drink any alcoholic beverage   2) You may resume regular meals tomorrow.  Today it is better to start with liquids and gradually work up to solid foods.  You may eat anything you prefer, but it is better to start with liquids, then soup and crackers, and gradually work up to solid foods.   3) Please notify your doctor immediately if you have any unusual bleeding, trouble breathing, redness and pain at the surgery site, drainage, fever, or pain not relieved by medication.    4) Additional Instructions:        Please contact your physician with any problems or Same Day Surgery at [phone], Monday through Friday 6 am to 4 pm, or [place] at [facility] number at [phone].

## 2019-05-24 NOTE — H&P (Signed)
Date of Initial H&P: 05/21/19  History reviewed, patient examined, no change in status, stable for surgery.

## 2019-05-24 NOTE — Anesthesia Post-op Follow-up Note (Signed)
Anesthesia QCDR form completed.        

## 2019-05-25 ENCOUNTER — Encounter: Payer: Self-pay | Admitting: Urology

## 2019-05-31 ENCOUNTER — Ambulatory Visit: Payer: Self-pay | Admitting: Urology

## 2019-05-31 DIAGNOSIS — U071 COVID-19: Secondary | ICD-10-CM

## 2019-05-31 HISTORY — DX: COVID-19: U07.1

## 2019-06-07 ENCOUNTER — Ambulatory Visit
Admission: RE | Admit: 2019-06-07 | Discharge: 2019-06-07 | Disposition: A | Discharge status: Home / Self Care | Payer: Medicare Other | Attending: Urology | Admitting: Urology

## 2019-06-07 ENCOUNTER — Encounter: Payer: Self-pay | Admitting: *Deleted

## 2019-06-07 ENCOUNTER — Encounter
Admission: RE | Disposition: A | Discharge status: Home / Self Care | Payer: Self-pay | Source: Home / Self Care | Attending: Urology

## 2019-06-07 ENCOUNTER — Other Ambulatory Visit: Payer: Self-pay

## 2019-06-07 DIAGNOSIS — Z87442 Personal history of urinary calculi: Secondary | ICD-10-CM | POA: Diagnosis not present

## 2019-06-07 DIAGNOSIS — N200 Calculus of kidney: Secondary | ICD-10-CM | POA: Diagnosis present

## 2019-06-07 DIAGNOSIS — G4730 Sleep apnea, unspecified: Secondary | ICD-10-CM | POA: Diagnosis not present

## 2019-06-07 HISTORY — PX: EXTRACORPOREAL SHOCK WAVE LITHOTRIPSY: SHX1557

## 2019-06-07 SURGERY — LITHOTRIPSY, ESWL
Anesthesia: Moderate Sedation | Laterality: Right

## 2019-06-07 MED ORDER — LEVOFLOXACIN 500 MG PO TABS
500.0000 mg | ORAL_TABLET | Freq: Once | ORAL | Status: AC
  Administered 2019-06-07: 11:00:00 500 mg via ORAL

## 2019-06-07 MED ORDER — LEVOFLOXACIN 500 MG PO TABS
ORAL_TABLET | ORAL | Status: AC
  Administered 2019-06-07: 500 mg via ORAL
  Filled 2019-06-07: qty 1

## 2019-06-07 MED ORDER — PROMETHAZINE HCL 25 MG RE SUPP: 25.0000 mg | Freq: Four times a day (QID) | RECTAL | 3 refills | Status: DC | PRN

## 2019-06-07 MED ORDER — MIDAZOLAM HCL 2 MG/2ML IJ SOLN
INTRAMUSCULAR | Status: AC
  Administered 2019-06-07: 1 mg via INTRAMUSCULAR
  Filled 2019-06-07: qty 2

## 2019-06-07 MED ORDER — FUROSEMIDE 10 MG/ML IJ SOLN: 10.0000 mg | Freq: Once | INTRAMUSCULAR | Status: DC

## 2019-06-07 MED ORDER — PROMETHAZINE HCL 25 MG/ML IJ SOLN
INTRAMUSCULAR | Status: AC
  Administered 2019-06-07: 25 mg via INTRAMUSCULAR
  Filled 2019-06-07: qty 1

## 2019-06-07 MED ORDER — MIDAZOLAM HCL 2 MG/2ML IJ SOLN
1.0000 mg | Freq: Once | INTRAMUSCULAR | Status: AC
  Administered 2019-06-07: 11:00:00 1 mg via INTRAMUSCULAR

## 2019-06-07 MED ORDER — PROMETHAZINE HCL 25 MG/ML IJ SOLN
25.0000 mg | Freq: Once | INTRAMUSCULAR | Status: AC
  Administered 2019-06-07: 11:00:00 25 mg via INTRAMUSCULAR

## 2019-06-07 MED ORDER — HYDROCODONE-ACETAMINOPHEN 7.5-325 MG PO TABS: 1.0000 | ORAL_TABLET | ORAL | 0 refills | Status: DC | PRN

## 2019-06-07 MED ORDER — MORPHINE SULFATE (PF) 10 MG/ML IV SOLN
10.0000 mg | Freq: Once | INTRAVENOUS | Status: AC
  Administered 2019-06-07: 11:00:00 10 mg via INTRAMUSCULAR

## 2019-06-07 MED ORDER — TAMSULOSIN HCL 0.4 MG PO CAPS: 0.4000 mg | ORAL_CAPSULE | Freq: Every day | ORAL | 1 refills | Status: DC

## 2019-06-07 MED ORDER — MORPHINE SULFATE (PF) 10 MG/ML IV SOLN
INTRAVENOUS | Status: AC
  Administered 2019-06-07: 10 mg via INTRAMUSCULAR
  Filled 2019-06-07: qty 1

## 2019-06-07 MED ORDER — DIPHENHYDRAMINE HCL 25 MG PO CAPS
ORAL_CAPSULE | ORAL | Status: AC
  Administered 2019-06-07: 25 mg via ORAL
  Filled 2019-06-07: qty 1

## 2019-06-07 MED ORDER — DEXTROSE-NACL 5-0.45 % IV SOLN
INTRAVENOUS | Status: DC
  Administered 2019-06-07: 11:00:00 via INTRAVENOUS

## 2019-06-07 MED ORDER — DIPHENHYDRAMINE HCL 25 MG PO CAPS
25.0000 mg | ORAL_CAPSULE | Freq: Once | ORAL | Status: AC
  Administered 2019-06-07: 11:00:00 25 mg via ORAL

## 2019-06-07 NOTE — Discharge Instructions (Signed)
Kidney Stones  Kidney stones (urolithiasis) are solid, rock-like deposits that form inside of the organs that make urine (kidneys). A kidney stone may form in a kidney and move into the bladder, where it can cause intense pain and block the flow of urine. Kidney stones are created when high levels of certain minerals are found in the urine. They are usually passed through urination, but in some cases, medical treatment may be needed to remove them. What are the causes? Kidney stones may be caused by:  A condition in which certain glands produce too much parathyroid hormone (primary hyperparathyroidism), which causes too much calcium buildup in the blood.  Buildup of uric acid crystals in the bladder (hyperuricosuria). Uric acid is a chemical that the body produces when you eat certain foods. It usually exits the body in the urine.  Narrowing (stricture) of one or both of the tubes that drain urine from the kidneys to the bladder (ureters).  A kidney blockage that is present at birth (congenital obstruction).  Past surgery on the kidney or the ureters, such as gastric bypass surgery. What increases the risk? The following factors make you more likely to develop kidney stones:  Having had a kidney stone in the past.  Having a family history of kidney stones.  Not drinking enough water.  Eating a diet that is high in protein, salt (sodium), or sugar.  Being overweight or obese. What are the signs or symptoms? Symptoms of a kidney stone may include:  Nausea.  Vomiting.  Blood in the urine (hematuria).  Pain in the side of the abdomen, right below the ribs (flank pain). Pain usually spreads (radiates) to the groin.  Needing to urinate frequently or urgently. How is this diagnosed? This condition may be diagnosed based on:  Your medical history.  A physical exam.  Blood tests.  Urine tests.  CT scan.  Abdominal X-ray.  A procedure to examine the inside of the bladder  (cystoscopy). How is this treated? Treatment for kidney stones depends on the size, location, and makeup of the stones. Treatment may involve:  Analyzing your urine before and after you pass the stone through urination.  Being monitored at the hospital until you pass the stone through urination.  Increasing your fluid intake and decreasing the amount of calcium and protein in your diet.  A procedure to break up kidney stones in the bladder using: ? A focused beam of light (laser therapy). ? Shock waves (extracorporeal shock wave lithotripsy).  Surgery to remove kidney stones. This may be needed if you have severe pain or have stones that block your urinary tract. Follow these instructions at home: Eating and drinking  Drink enough fluid to keep your urine clear or pale yellow. This will help you to pass the kidney stone.  If directed, change your diet. This may include: ? Limiting how much sodium you eat. ? Eating more fruits and vegetables. ? Limiting how much meat, poultry, fish, and eggs you eat.  Follow instructions from your health care provider about eating or drinking restrictions. General instructions  Collect urine samples as told by your health care provider. You may need to collect a urine sample: ? 24 hours after you pass the stone. ? 8-12 weeks after passing the kidney stone, and every 6-12 months after that.  Strain your urine every time you urinate, for as long as directed. Use the strainer that your health care provider recommends.  Do not throw out the kidney stone after passing  it. Keep the stone so it can be tested by your health care provider. Testing the makeup of your kidney stone may help prevent you from getting kidney stones in the future.  Take over-the-counter and prescription medicines only as told by your health care provider.  Keep all follow-up visits as told by your health care provider. This is important. You may need follow-up X-rays or  ultrasounds to make sure that your stone has passed. How is this prevented? To prevent another kidney stone:  Drink enough fluid to keep your urine clear or pale yellow. This is the best way to prevent kidney stones.  Eat a healthy diet and follow recommendations from your health care provider about foods to avoid. You may be instructed to eat a low-protein diet. Recommendations vary depending on the type of kidney stone that you have.  Maintain a healthy weight. Contact a health care provider if:  You have pain that gets worse or does not get better with medicine. Get help right away if:  You have a fever or chills.  You develop severe pain.  You develop new abdominal pain.  You faint.  You are unable to urinate. This information is not intended to replace advice given to you by your health care provider. Make sure you discuss any questions you have with your health care provider. Document Released: 08/16/2005 Document Revised: 03/28/2018 Document Reviewed: 01/30/2016 Elsevier Patient Education  2020 [place] After This sheet gives you information about how to care for yourself after your procedure. Your health care provider may also give you more specific instructions. If you have problems or questions, contact your health care provider. What can I expect after the procedure? After the procedure, it is common to have:  Some blood in your urine. This should only last for a few days.  Soreness in your back, sides, or upper abdomen for a few days.  Blotches or bruises on your back where the pressure wave entered the skin.  Pain, discomfort, or nausea when pieces (fragments) of the kidney stone move through the tube that carries urine from the kidney to the bladder (ureter). Stone fragments may pass soon after the procedure, but they may continue to pass for up to 4-8 weeks. ? If you have severe pain or nausea, contact your health care provider. This may  be caused by a large stone that was not broken up, and this may mean that you need more treatment.  Some pain or discomfort during urination.  Some pain or discomfort in the lower abdomen or (in men) at the base of the penis. Follow these instructions at home: Medicines  Take over-the-counter and prescription medicines only as told by your health care provider.  If you were prescribed an antibiotic medicine, take it as told by your health care provider. Do not stop taking the antibiotic even if you start to feel better.  Do not drive for 24 hours if you were given a medicine to help you relax (sedative).  Do not drive or use heavy machinery while taking prescription pain medicine. Eating and drinking      Drink enough water and fluids to keep your urine clear or pale yellow. This helps any remaining pieces of the stone to pass. It can also help prevent new stones from forming.  Eat plenty of fresh fruits and vegetables.  Follow instructions from your health care provider about eating and drinking restrictions. You may be instructed: ? To reduce how much  salt (sodium) you eat or drink. Check ingredients and nutrition facts on packaged foods and beverages. ? To reduce how much meat you eat.  Eat the recommended amount of calcium for your age and gender. Ask your health care provider how much calcium you should have. General instructions  Get plenty of rest.  Most people can resume normal activities 1-2 days after the procedure. Ask your health care provider what activities are safe for you.  Your health care provider may direct you to lie in a certain position (postural drainage) and tap firmly (percuss) over your kidney area to help stone fragments pass. Follow instructions as told by your health care provider.  If directed, strain all urine through the strainer that was provided by your health care provider. ? Keep all fragments for your health care provider to see. Any stones  that are found may be sent to a medical lab for examination. The stone may be as small as a grain of salt.  Keep all follow-up visits as told by your health care provider. This is important. Contact a health care provider if:  You have pain that is severe or does not get better with medicine.  You have nausea that is severe or does not go away.  You have blood in your urine longer than your health care provider told you to expect.  You have more blood in your urine.  You have pain during urination that does not go away.  You urinate more frequently than usual and this does not go away.  You develop a rash or any other possible signs of an allergic reaction. Get help right away if:  You have severe pain in your back, sides, or upper abdomen.  You have severe pain while urinating.  Your urine is very dark red.  You have blood in your stool (feces).  You cannot pass any urine at all.  You feel a strong urge to urinate after emptying your bladder.  You have a fever or chills.  You develop shortness of breath, difficulty breathing, or chest pain.  You have severe nausea that leads to persistent vomiting.  You faint. Summary  After this procedure, it is common to have some pain, discomfort, or nausea when pieces (fragments) of the kidney stone move through the tube that carries urine from the kidney to the bladder (ureter). If this pain or nausea is severe, however, you should contact your health care provider.  Most people can resume normal activities 1-2 days after the procedure. Ask your health care provider what activities are safe for you.  Drink enough water and fluids to keep your urine clear or pale yellow. This helps any remaining pieces of the stone to pass, and it can help prevent new stones from forming.  If directed, strain your urine and keep all fragments for your health care provider to see. Fragments or stones may be as small as a grain of salt.  Get help  right away if you have severe pain in your back, sides, or upper abdomen or have severe pain while urinating. This information is not intended to replace advice given to you by your health care provider. Make sure you discuss any questions you have with your health care provider. Document Released: 09/05/2007 Document Revised: 11/27/2018 Document Reviewed: 07/07/2016 Elsevier Patient Education  2020 [employer].

## 2019-06-08 ENCOUNTER — Encounter: Payer: Self-pay | Admitting: Urology

## 2019-06-16 ENCOUNTER — Encounter: Payer: Self-pay | Admitting: Urology

## 2019-06-25 DIAGNOSIS — Z8616 Personal history of COVID-19: Secondary | ICD-10-CM | POA: Insufficient documentation

## 2019-07-18 ENCOUNTER — Telehealth (INDEPENDENT_AMBULATORY_CARE_PROVIDER_SITE_OTHER): Payer: Self-pay | Admitting: Vascular Surgery

## 2019-07-19 NOTE — Telephone Encounter (Signed)
She can come in with a LLE Reflux study to see schnier

## 2019-08-16 ENCOUNTER — Ambulatory Visit (INDEPENDENT_AMBULATORY_CARE_PROVIDER_SITE_OTHER): Payer: Medicare Other | Admitting: Vascular Surgery

## 2019-08-16 ENCOUNTER — Other Ambulatory Visit (INDEPENDENT_AMBULATORY_CARE_PROVIDER_SITE_OTHER): Payer: Self-pay | Admitting: Nurse Practitioner

## 2019-08-16 ENCOUNTER — Encounter (INDEPENDENT_AMBULATORY_CARE_PROVIDER_SITE_OTHER): Payer: Medicare Other

## 2019-08-16 DIAGNOSIS — I8392 Asymptomatic varicose veins of left lower extremity: Secondary | ICD-10-CM

## 2019-08-20 ENCOUNTER — Ambulatory Visit (INDEPENDENT_AMBULATORY_CARE_PROVIDER_SITE_OTHER): Payer: Medicare Other | Admitting: Vascular Surgery

## 2019-08-20 ENCOUNTER — Ambulatory Visit (INDEPENDENT_AMBULATORY_CARE_PROVIDER_SITE_OTHER): Payer: Medicare Other

## 2019-08-20 ENCOUNTER — Encounter (INDEPENDENT_AMBULATORY_CARE_PROVIDER_SITE_OTHER): Payer: Self-pay

## 2019-08-20 ENCOUNTER — Encounter (INDEPENDENT_AMBULATORY_CARE_PROVIDER_SITE_OTHER): Payer: Self-pay | Admitting: Vascular Surgery

## 2019-08-20 ENCOUNTER — Other Ambulatory Visit: Payer: Self-pay

## 2019-08-20 VITALS — BP 118/81 | HR 85 | Resp 18 | Ht 63.0 in | Wt 166.0 lb

## 2019-08-20 DIAGNOSIS — I83819 Varicose veins of unspecified lower extremities with pain: Secondary | ICD-10-CM | POA: Diagnosis not present

## 2019-08-20 DIAGNOSIS — I872 Venous insufficiency (chronic) (peripheral): Secondary | ICD-10-CM

## 2019-08-20 DIAGNOSIS — I8392 Asymptomatic varicose veins of left lower extremity: Secondary | ICD-10-CM

## 2019-08-20 DIAGNOSIS — E782 Mixed hyperlipidemia: Secondary | ICD-10-CM | POA: Diagnosis not present

## 2019-08-20 DIAGNOSIS — Z87442 Personal history of urinary calculi: Secondary | ICD-10-CM | POA: Insufficient documentation

## 2019-08-20 DIAGNOSIS — N200 Calculus of kidney: Secondary | ICD-10-CM | POA: Insufficient documentation

## 2019-08-20 DIAGNOSIS — J449 Chronic obstructive pulmonary disease, unspecified: Secondary | ICD-10-CM

## 2019-08-20 NOTE — Progress Notes (Signed)
MRN : [id number]  Darlene Hubbard is a 68 y.o. (11/03/1950) female who presents with chief complaint of  Chief Complaint  Patient presents with  . Varicose Veins  .  History of Present Illness:   The patient returns for followup evaluation more than 3 months after the initial visit. The patient continues to have pain in the lower extremities with dependency. The pain is lessened with elevation. Graduated compression stockings, Class I (20-30 mmHg), have been worn but the stockings do not eliminate the leg pain. Over-the-counter analgesics do not improve the symptoms. The degree of discomfort continues to interfere with daily activities. The patient notes the pain in the legs is causing problems with daily exercise, at the workplace and even with household activities and maintenance such as standing in the kitchen preparing meals and doing dishes.   Venous ultrasound shows normal deep venous system, no evidence of acute or chronic DVT.  Superficial reflux is present in the left GSV  Current Meds  Medication Sig  . ALPRAZolam (XANAX) 0.5 MG tablet Take 0.5 mg by mouth at bedtime.   Marland Kitchen azelastine (OPTIVAR) 0.05 % ophthalmic solution Place 1 drop into both eyes 2 (two) times daily as needed for allergies.  . cetirizine (ZYRTEC) 10 MG tablet Take 10 mg by mouth daily as needed for allergies.   . clotrimazole-betamethasone (LOTRISONE) cream Apply to affected area 2 times daily  . EPINEPHrine 0.3 mg/0.3 mL IJ SOAJ injection Inject 0.3 mg into the muscle as needed for anaphylaxis.  Marland Kitchen escitalopram (LEXAPRO) 10 MG tablet Take 10 mg by mouth daily.  . Fluticasone-Salmeterol (ADVAIR) 100-50 MCG/DOSE AEPB Inhale 1 puff into the lungs 2 (two) times daily as needed (respiratory issues.).   Marland Kitchen montelukast (SINGULAIR) 10 MG tablet Take 10 mg by mouth daily as needed (respiratory issues.).     Past Medical History:  Diagnosis Date  . Allergy   . Asthma   . Bursitis    BOTH SHOULDER AND HIPS  .  Fibromyalgia   . GERD (gastroesophageal reflux disease)   . Hiatal hernia   . IBS (irritable bowel syndrome)   . Mild obstructive sleep apnea    does not use Cpap  . Morbid obesity ([place])   . Spinal stenosis   . Varicose veins     Past Surgical History:  Procedure Laterality Date  . ABDOMINAL HYSTERECTOMY  1993  . APPENDECTOMY  1995  . BACK SURGERY    . BREAST BIOPSY Left 2000   neg  . CHOLECYSTECTOMY    . COLONOSCOPY  2008  . COLONOSCOPY WITH PROPOFOL N/A 02/18/2016   Procedure: COLONOSCOPY WITH PROPOFOL;  Surgeon: [name], MD;  Location[facility] ENDOSCOPY;  Service: Endoscopy;  Laterality: N/A;  . CYSTOSCOPY W/ RETROGRADES Left 05/24/2019   Procedure: CYSTOSCOPY WITH RETROGRADE PYELOGRAM;  Surgeon: Royston Cowper, MD;  Location: [facility] ORS;  Service: Urology;  Laterality: Left;  . EXTRACORPOREAL SHOCK WAVE LITHOTRIPSY Right 06/07/2019   Procedure: EXTRACORPOREAL SHOCK WAVE LITHOTRIPSY (ESWL);  Surgeon: Royston Cowper, MD;  Location: [facility] ORS;  Service: Urology;  Laterality: Right;  . ORIF PATELLA Left 06/02/2016   Procedure: OPEN REDUCTION INTERNAL (ORIF) FIXATION PATELLA;  Surgeon: Dereck Leep, MD;  Location: [facility] ORS;  Service: Orthopedics;  Laterality: Left;  . PARTIAL HYSTERECTOMY    . PATELLAR TENDON REPAIR Left 06/02/2016   Procedure: PATELLA TENDON REPAIR;  Surgeon: Dereck Leep, MD;  Location: [facility] ORS;  Service: Orthopedics;  Laterality: Left;  . TUBAL  LIGATION    . UPPER GI ENDOSCOPY  2008  . URETEROSCOPY WITH HOLMIUM LASER LITHOTRIPSY Left 05/24/2019   Procedure: URETEROSCOPY WITH HOLMIUM LASER LITHOTRIPSY;  Surgeon: Royston Cowper, MD;  Location: [facility] ORS;  Service: Urology;  Laterality: Left;  . vein closure procedure Bilateral 2010    Social History Social History   Tobacco Use  . Smoking status: Never Smoker  . Smokeless tobacco: Never Used  Substance Use Topics  . Alcohol use: Not Currently  . Drug use: No    Family History Family History    Problem Relation Age of Onset  . Colon cancer Father 50  . Thrombocytopenia Mother 3  . Other Sister        Bone Marrow disorder  . Breast cancer Neg Hx     Allergies  Allergen Reactions  . Molds & Smuts Other (See Comments)    allergic  . Diovan [Valsartan] Rash    Edema  . Sulfa Drugs Cross Reactors Itching and Rash     REVIEW OF SYSTEMS (Negative unless checked)  Constitutional: [] Weight loss  [] Fever  [] Chills Cardiac: [] Chest pain   [] Chest pressure   [] Palpitations   [] Shortness of breath when laying flat   [] Shortness of breath with exertion. Vascular:  [] Pain in legs with walking   [x] Pain in legs at rest  [] History of DVT   [] Phlebitis   [x] Swelling in legs   [x] Varicose veins   [] Non-healing ulcers Pulmonary:   [] Uses home oxygen   [] Productive cough   [] Hemoptysis   [] Wheeze  [] COPD   [] Asthma Neurologic:  [] Dizziness   [] Seizures   [] History of stroke   [] History of TIA  [] Aphasia   [] Vissual changes   [] Weakness or numbness in arm   [] Weakness or numbness in leg Musculoskeletal:   [] Joint swelling   [] Joint pain   [] Low back pain Hematologic:  [] Easy bruising  [] Easy bleeding   [] Hypercoagulable state   [] Anemic Gastrointestinal:  [] Diarrhea   [] Vomiting  [] Gastroesophageal reflux/heartburn   [] Difficulty swallowing. Genitourinary:  [] Chronic kidney disease   [] Difficult urination  [] Frequent urination   [] Blood in urine Skin:  [] Rashes   [] Ulcers  Psychological:  [] History of anxiety   []  History of major depression.  Physical Examination  Vitals:   08/20/19 1539  BP: 118/81  Pulse: 85  Resp: 18  Weight: 166 lb (75.3 kg)  Height: 5\' 3"  (1.6 m)   Body mass index is 29.41 kg/m. Gen: WD/WN, NAD Head: [place]/AT, No temporalis wasting.  Ear/Nose/Throat: Hearing grossly intact, nares w/o erythema or drainage Eyes: PER, EOMI, sclera nonicteric.  Neck: Supple, no large masses.   Pulmonary:  Good air movement, no audible wheezing bilaterally, no use of accessory  muscles.  Cardiac: RRR, no JVD Vascular: Large varicosities present extensively greater than 10 mm left leg.  Mild venous stasis changes to the legs bilaterally.  2+ soft pitting edema Vessel Right Left  PT Palpable Palpable  DP Palpable Palpable  Gastrointestinal: Non-distended. No guarding/no peritoneal signs.  Musculoskeletal: M/S 5/5 throughout.  No deformity or atrophy.  Neurologic: CN 2-12 intact. Symmetrical.  Speech is fluent. Motor exam as listed above. Psychiatric: Judgment intact, Mood & affect appropriate for pt's clinical situation. Dermatologic: No rashes or ulcers noted.  No changes consistent with cellulitis. Lymph : No lichenification or skin changes of chronic lymphedema.  CBC Lab Results  Component Value Date   WBC 12.5 (H) 05/10/2019   HGB 13.7 05/10/2019   HCT 42.1 05/10/2019   MCV  91.5 05/10/2019   PLT 253 05/10/2019    BMET    Component Value Date/Time   NA 144 05/10/2019 2019   NA 139 04/26/2013 1716   K 3.9 05/10/2019 2019   K 3.4 (L) 04/26/2013 1716   CL 108 05/10/2019 2019   CL 104 04/26/2013 1716   CO2 28 05/10/2019 2019   CO2 30 04/26/2013 1716   GLUCOSE 125 (H) 05/10/2019 2019   GLUCOSE 97 04/26/2013 1716   BUN 18 05/10/2019 2019   BUN 18 04/26/2013 1716   CREATININE 0.73 05/10/2019 2019   CREATININE 0.85 04/26/2013 1716   CALCIUM 9.0 05/10/2019 2019   CALCIUM 8.8 04/26/2013 1716   GFRNONAA >60 05/10/2019 2019   GFRNONAA >60 04/26/2013 1716   GFRAA >60 05/10/2019 2019   GFRAA >60 04/26/2013 1716   CrCl cannot be calculated (Patient's most recent lab result is older than the maximum 21 days allowed.).  COAG No results found for: INR, PROTIME  Radiology No results found.   Assessment/Plan 1. Varicose veins with pain Recommend  I have reviewed my previous  discussion with the patient regarding  varicose veins and why they cause symptoms. Patient will continue  wearing graduated compression stockings class 1 on a daily basis,  beginning first thing in the morning and removing them in the evening.    In addition, behavioral modification including elevation during the day was again discussed and this will continue.  The patient has utilized over the counter pain medications and has been exercising.  However, at this time conservative therapy has not alleviated the patient's symptoms of leg pain and swelling  Recommend: laser ablation of the left great saphenous vein to eliminate the symptoms of pain and swelling of the lower extremities caused by the severe superficial venous reflux disease.   2. Chronic venous insufficiency No surgery or intervention at this point in time.    I have had a long discussion with the patient regarding venous insufficiency and why it  causes symptoms. I have discussed with the patient the chronic skin changes that accompany venous insufficiency and the long term sequela such as infection and ulceration.  Patient will begin wearing graduated compression stockings class 1 (20-30 mmHg) or compression wraps on a daily basis a prescription was given. The patient will put the stockings on first thing in the morning and removing them in the evening. The patient is instructed specifically not to sleep in the stockings.    In addition, behavioral modification including several periods of elevation of the lower extremities during the day will be continued. I have demonstrated that proper elevation is a position with the ankles at heart level.  The patient is instructed to begin routine exercise, especially walking on a daily basis  3. Hyperlipidemia, mixed Continue antihypertensive medications as already ordered, these medications have been reviewed and there are no changes at this time.   4. Chronic obstructive pulmonary disease, unspecified COPD type ([place]) Continue pulmonary medications and aerosols as already ordered, these medications have been reviewed and there are no changes at this  time.    Hortencia Pilar, MD  08/20/2019 4:02 PM

## 2019-09-02 ENCOUNTER — Encounter (INDEPENDENT_AMBULATORY_CARE_PROVIDER_SITE_OTHER): Payer: Self-pay | Admitting: Vascular Surgery

## 2019-09-02 DIAGNOSIS — J449 Chronic obstructive pulmonary disease, unspecified: Secondary | ICD-10-CM | POA: Insufficient documentation

## 2019-09-21 ENCOUNTER — Ambulatory Visit: Payer: Medicare PPO | Attending: Internal Medicine

## 2019-09-21 DIAGNOSIS — Z23 Encounter for immunization: Secondary | ICD-10-CM

## 2019-09-21 NOTE — Progress Notes (Signed)
   Covid-19 Vaccination Clinic  Name:  Darlene Hubbard    MRN: [id number] DOB: 1951-05-26  09/21/2019  [name] was observed post Covid-19 immunization for 15 minutes without incidence. She was provided with Vaccine Information Sheet and instruction to access the V-Safe system.   [name] was instructed to call 911 with any severe reactions post vaccine: Marland Kitchen Difficulty breathing  . Swelling of your face and throat  . A fast heartbeat  . A bad rash all over your body  . Dizziness and weakness    Immunizations Administered    Name Date Dose VIS Date Route   Pfizer COVID-19 Vaccine 09/21/2019  2:50 PM 0.3 mL 08/10/2019 Intramuscular   Manufacturer: [place]   Lot: [id number]   [place]: [id number]

## 2019-09-27 ENCOUNTER — Other Ambulatory Visit: Payer: Self-pay

## 2019-09-27 ENCOUNTER — Other Ambulatory Visit (INDEPENDENT_AMBULATORY_CARE_PROVIDER_SITE_OTHER): Payer: Self-pay | Admitting: Vascular Surgery

## 2019-09-27 ENCOUNTER — Ambulatory Visit (INDEPENDENT_AMBULATORY_CARE_PROVIDER_SITE_OTHER): Payer: Medicare PPO | Admitting: Vascular Surgery

## 2019-09-27 ENCOUNTER — Encounter (INDEPENDENT_AMBULATORY_CARE_PROVIDER_SITE_OTHER): Payer: Self-pay | Admitting: Vascular Surgery

## 2019-09-27 VITALS — BP 134/86 | HR 89 | Resp 16 | Ht 63.0 in | Wt 166.0 lb

## 2019-09-27 DIAGNOSIS — I83819 Varicose veins of unspecified lower extremities with pain: Secondary | ICD-10-CM | POA: Diagnosis not present

## 2019-09-27 DIAGNOSIS — I83813 Varicose veins of bilateral lower extremities with pain: Secondary | ICD-10-CM

## 2019-09-27 DIAGNOSIS — Z9889 Other specified postprocedural states: Secondary | ICD-10-CM

## 2019-09-27 DIAGNOSIS — I872 Venous insufficiency (chronic) (peripheral): Secondary | ICD-10-CM

## 2019-09-27 NOTE — Progress Notes (Signed)
    MRN : [id number]  Francis Poinsett is a 69 y.o. (03-Jun-1951) female who presents with chief complaint of painful varicose veins.    The patient's left lower extremity was sterilely prepped and draped.  The ultrasound machine was used to visualize the left saphenous vein throughout its course.  A segment just above the knee was selected for access.  The saphenous vein was accessed without difficulty using ultrasound guidance with a micropuncture needle.   An 0.018  wire was placed beyond the saphenofemoral junction through the sheath and the microneedle was removed.  The 65 cm sheath was then placed over the wire and the wire and dilator were removed.  The laser fiber was placed through the sheath and its tip was placed approximately 2 cm below the saphenofemoral junction.  Tumescent anesthesia was then created with a dilute lidocaine solution.  Laser energy was then delivered with constant withdrawal of the sheath and laser fiber.  Approximately 650 Joules of energy were delivered over a length of 18 cm.  Sterile dressings were placed.  The patient tolerated the procedure well without complications.

## 2019-10-01 ENCOUNTER — Ambulatory Visit (INDEPENDENT_AMBULATORY_CARE_PROVIDER_SITE_OTHER): Payer: Medicare PPO

## 2019-10-01 ENCOUNTER — Other Ambulatory Visit: Payer: Self-pay

## 2019-10-01 DIAGNOSIS — I83813 Varicose veins of bilateral lower extremities with pain: Secondary | ICD-10-CM | POA: Diagnosis not present

## 2019-10-01 DIAGNOSIS — Z9889 Other specified postprocedural states: Secondary | ICD-10-CM | POA: Diagnosis not present

## 2019-10-12 ENCOUNTER — Ambulatory Visit: Payer: Medicare PPO | Attending: Internal Medicine

## 2019-10-12 DIAGNOSIS — Z23 Encounter for immunization: Secondary | ICD-10-CM | POA: Insufficient documentation

## 2019-10-12 NOTE — Progress Notes (Signed)
   Covid-19 Vaccination Clinic  Name:  Darlene Hubbard    MRN: [id number] DOB: 30-Dec-1950  10/12/2019  Darlene Hubbard was observed post Covid-19 immunization for 15 minutes without incidence. She was provided with Vaccine Information Sheet and instruction to access the V-Safe system.   Darlene Hubbard was instructed to call 911 with any severe reactions post vaccine: Marland Kitchen Difficulty breathing  . Swelling of your face and throat  . A fast heartbeat  . A bad rash all over your body  . Dizziness and weakness    Immunizations Administered    Name Date Dose VIS Date Route   Pfizer COVID-19 Vaccine 10/12/2019  2:23 PM 0.3 mL 08/10/2019 Intramuscular   Manufacturer: [place]   Lot: [id number]   [place]: [id number]

## 2020-03-17 ENCOUNTER — Other Ambulatory Visit: Payer: Self-pay

## 2020-03-17 ENCOUNTER — Ambulatory Visit: Payer: Medicare PPO | Admitting: Dermatology

## 2020-03-17 DIAGNOSIS — D1800 Hemangioma unspecified site: Secondary | ICD-10-CM

## 2020-03-17 DIAGNOSIS — D2262 Melanocytic nevi of left upper limb, including shoulder: Secondary | ICD-10-CM

## 2020-03-17 DIAGNOSIS — L719 Rosacea, unspecified: Secondary | ICD-10-CM

## 2020-03-17 DIAGNOSIS — D229 Melanocytic nevi, unspecified: Secondary | ICD-10-CM

## 2020-03-17 DIAGNOSIS — L817 Pigmented purpuric dermatosis: Secondary | ICD-10-CM | POA: Diagnosis not present

## 2020-03-17 DIAGNOSIS — Z1283 Encounter for screening for malignant neoplasm of skin: Secondary | ICD-10-CM | POA: Diagnosis not present

## 2020-03-17 DIAGNOSIS — L820 Inflamed seborrheic keratosis: Secondary | ICD-10-CM

## 2020-03-17 DIAGNOSIS — D692 Other nonthrombocytopenic purpura: Secondary | ICD-10-CM

## 2020-03-17 DIAGNOSIS — L578 Other skin changes due to chronic exposure to nonionizing radiation: Secondary | ICD-10-CM

## 2020-03-17 DIAGNOSIS — D485 Neoplasm of uncertain behavior of skin: Secondary | ICD-10-CM

## 2020-03-17 DIAGNOSIS — L821 Other seborrheic keratosis: Secondary | ICD-10-CM

## 2020-03-17 DIAGNOSIS — L814 Other melanin hyperpigmentation: Secondary | ICD-10-CM

## 2020-03-17 NOTE — Patient Instructions (Signed)

## 2020-03-17 NOTE — Progress Notes (Signed)
Follow-Up Visit   Subjective  Darlene Hubbard is a 69 y.o. female who presents for the following: Annual Exam (History of BCC and Dysplastic nevus - TBSE today). The patient presents for Total-Body Skin Exam (TBSE) for skin cancer screening and mole check.  The following portions of the chart were reviewed this encounter and updated as appropriate:  Tobacco  Allergies  Meds  Problems  Med Hx  Surg Hx  Fam Hx     Review of Systems:  No other skin or systemic complaints except as noted in HPI or Assessment and Plan.  Objective  Well appearing patient in no apparent distress; mood and affect are within normal limits.  A full examination was performed including scalp, head, eyes, ears, nose, lips, neck, chest, axillae, abdomen, back, buttocks, bilateral upper extremities, bilateral lower extremities, hands, feet, fingers, toes, fingernails, and toenails. All findings within normal limits unless otherwise noted below.  Objective  Left mid lat tricep: 0.5 cm irregular brown macule  Objective  Right sideburn x 1, right buttock x 1 (2): Erythematous keratotic or waxy stuck-on papule or plaque.   Objective  Head - Anterior (Face): Dilated blood vessels of cheeks and nose  Objective  bilateral legs: Stasis changes and hyperpigmentation.   Assessment & Plan    Lentigines - Scattered tan macules - Discussed due to sun exposure - Benign, observe - Call for any changes  Seborrheic Keratoses - Stuck-on, waxy, tan-brown papules and plaques  - Discussed benign etiology and prognosis. - Observe - Call for any changes  Melanocytic Nevi - Tan-brown and/or pink-flesh-colored symmetric macules and papules - Benign appearing on exam today - Observation - Call clinic for new or changing moles - Recommend daily use of broad spectrum spf 30+ sunscreen to sun-exposed areas.   Hemangiomas - Red papules - Discussed benign nature - Observe - Call for any changes  Actinic Damage -  diffuse scaly erythematous macules with underlying dyspigmentation - Recommend daily broad spectrum sunscreen SPF 30+ to sun-exposed areas, reapply every 2 hours as needed.  - Call for new or changing lesions.  Skin cancer screening performed today.  Purpura - Violaceous macules and patches - Benign - Related to age, sun damage and/or use of blood thinners - Observe - Can use OTC arnica containing moisturizer such as Dermend Bruise Formula if desired - Call for worsening or other concerns   Neoplasm of uncertain behavior of skin Left mid lat tricep  Epidermal / dermal shaving  Lesion diameter (cm):  0.5 Informed consent: discussed and consent obtained   Timeout: patient name, date of birth, surgical site, and procedure verified   Procedure prep:  Patient was prepped and draped in usual sterile fashion Prep type:  Isopropyl alcohol Anesthesia: the lesion was anesthetized in a standard fashion   Anesthetic:  1% lidocaine w/ epinephrine 1-100,000 buffered w/ 8.4% NaHCO3 Instrument used: flexible razor blade   Hemostasis achieved with: pressure, aluminum chloride and electrodesiccation   Outcome: patient tolerated procedure well   Post-procedure details: sterile dressing applied and wound care instructions given   Dressing type: bandage and petrolatum   Additional details:  Post treatment defect - 0.9 cm  Specimen 1 - Surgical pathology Differential Diagnosis: Nevus vs dysplastic nevus Check Margins: No 0.5 cm irregular brown macule  Inflamed seborrheic keratosis (2) Right sideburn x 1, right buttock x 1  Destruction of lesion - Right sideburn x 1, right buttock x 1 Complexity: simple   Destruction method: cryotherapy   Informed consent:  discussed and consent obtained   Timeout:  patient name, date of birth, surgical site, and procedure verified Lesion destroyed using liquid nitrogen: Yes   Region frozen until ice ball extended beyond lesion: Yes   Outcome: patient  tolerated procedure well with no complications   Post-procedure details: wound care instructions given    Rosacea Head - Anterior (Face)  Discussed BBL.  Schamberg's purpura bilateral legs  Skin cancer screening  Return in about 1 year (around 03/17/2021).   I, Ashok Cordia, CMA, am acting as scribe for Sarina Ser, MD .  Documentation: I have reviewed the above documentation for accuracy and completeness, and I agree with the above.  Sarina Ser, MD

## 2020-03-19 ENCOUNTER — Telehealth: Payer: Self-pay

## 2020-03-19 NOTE — Telephone Encounter (Signed)
Patient called and informed of biopsy results, patient verbalized understanding.  

## 2020-03-20 ENCOUNTER — Encounter: Payer: Self-pay | Admitting: Dermatology

## 2020-04-07 ENCOUNTER — Telehealth: Payer: Self-pay

## 2020-04-07 MED ORDER — METRONIDAZOLE 0.75 % EX LOTN: 1.0000 "application " | TOPICAL_LOTION | Freq: Every evening | CUTANEOUS | 2 refills | Status: DC

## 2020-04-07 NOTE — Telephone Encounter (Signed)
We discussed laser / BBL treatment - this is the best treatment if she just has redness and vessels. If she also has bumps or if bumps just started - acne like bumps etc, then topical or oral meds are helpful for bumps (but not vessels). We can send Metrolotion 1% or 0.75% qHS. May also add a pill later if topical not enough for bumps.  There are also meds like Rhofade and Mirvaso that can help temporarily with redness - apply in AM and can help for 6-8 hrs. If needs further discussion or follow up - may make appt.

## 2020-04-07 NOTE — Telephone Encounter (Signed)
Patient advised of information per Dr. Nehemiah Massed and medication sent in.

## 2020-04-07 NOTE — Telephone Encounter (Signed)
Patient called about her rosacea that was documented during her July visit. She would like to know if there is anything we can prescribe to help patient?

## 2020-09-01 ENCOUNTER — Telehealth: Payer: Self-pay

## 2020-09-01 NOTE — Telephone Encounter (Signed)
Patient called and left a voicemail that she used a new cream on her face and now it is swollen and inflamed. I left a voicemail for her to return my call so we could get her worked in to be evaluated.

## 2020-11-17 ENCOUNTER — Other Ambulatory Visit: Payer: Self-pay

## 2020-11-17 DIAGNOSIS — S80812A Abrasion, left lower leg, initial encounter: Secondary | ICD-10-CM | POA: Diagnosis not present

## 2020-11-17 DIAGNOSIS — S80811A Abrasion, right lower leg, initial encounter: Secondary | ICD-10-CM | POA: Insufficient documentation

## 2020-11-17 DIAGNOSIS — Z8616 Personal history of COVID-19: Secondary | ICD-10-CM | POA: Diagnosis not present

## 2020-11-17 DIAGNOSIS — Z85828 Personal history of other malignant neoplasm of skin: Secondary | ICD-10-CM | POA: Insufficient documentation

## 2020-11-17 DIAGNOSIS — W19XXXA Unspecified fall, initial encounter: Secondary | ICD-10-CM | POA: Diagnosis not present

## 2020-11-17 DIAGNOSIS — J449 Chronic obstructive pulmonary disease, unspecified: Secondary | ICD-10-CM | POA: Insufficient documentation

## 2020-11-17 DIAGNOSIS — R110 Nausea: Secondary | ICD-10-CM | POA: Insufficient documentation

## 2020-11-17 DIAGNOSIS — J45909 Unspecified asthma, uncomplicated: Secondary | ICD-10-CM | POA: Diagnosis not present

## 2020-11-17 DIAGNOSIS — R42 Dizziness and giddiness: Secondary | ICD-10-CM | POA: Diagnosis not present

## 2020-11-17 LAB — COMPREHENSIVE METABOLIC PANEL
ALT: 15 U/L (ref 0–44)
AST: 17 U/L (ref 15–41)
Albumin: 3.8 g/dL (ref 3.5–5.0)
Alkaline Phosphatase: 72 U/L (ref 38–126)
Anion gap: 6 (ref 5–15)
BUN: 18 mg/dL (ref 8–23)
CO2: 29 mmol/L (ref 22–32)
Calcium: 8.8 mg/dL — ABNORMAL LOW (ref 8.9–10.3)
Chloride: 104 mmol/L (ref 98–111)
Creatinine, Ser: 0.79 mg/dL (ref 0.44–1.00)
GFR, Estimated: >60 mL/min (ref >60)
Glucose, Bld: 100 mg/dL — ABNORMAL HIGH (ref 70–99)
Potassium: 4.2 mmol/L (ref 3.5–5.1)
Sodium: 139 mmol/L (ref 135–145)
Total Bilirubin: 0.7 mg/dL (ref 0.3–1.2)
Total Protein: 6.6 g/dL (ref 6.5–8.1)

## 2020-11-17 LAB — CBC
HCT: 42.4 % (ref 36.0–46.0)
Hemoglobin: 14.1 g/dL (ref 12.0–15.0)
MCH: 29.8 pg (ref 26.0–34.0)
MCHC: 33.3 g/dL (ref 30.0–36.0)
MCV: 89.6 fL (ref 80.0–100.0)
Platelets: 266 10*3/uL (ref 150–400)
RBC: 4.73 MIL/uL (ref 3.87–5.11)
RDW: 13.5 % (ref 11.5–15.5)
WBC: 11.7 10*3/uL — ABNORMAL HIGH (ref 4.0–10.5)
nRBC: 0 % (ref 0.0–0.2)

## 2020-11-17 LAB — TROPONIN I (HIGH SENSITIVITY): Troponin I (High Sensitivity): 4 ng/L (ref <18)
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
## 2020-11-18 ENCOUNTER — Emergency Department
Admission: EM | Admit: 2020-11-18 | Discharge: 2020-11-18 | Disposition: A | Discharge status: Home / Self Care | Payer: Medicare PPO | Attending: Emergency Medicine | Admitting: Emergency Medicine

## 2020-11-18 DIAGNOSIS — R42 Dizziness and giddiness: Secondary | ICD-10-CM

## 2020-11-18 LAB — URINALYSIS, ROUTINE W REFLEX MICROSCOPIC
Bacteria, UA: NONE SEEN
Bilirubin Urine: NEGATIVE
Glucose, UA: NEGATIVE mg/dL
Ketones, ur: NEGATIVE mg/dL
Leukocytes,Ua: NEGATIVE
Nitrite: NEGATIVE
Protein, ur: NEGATIVE mg/dL
Specific Gravity, Urine: 1.018 (ref 1.005–1.030)
pH: 5 (ref 5.0–8.0)

## 2020-11-18 LAB — TROPONIN I (HIGH SENSITIVITY): Troponin I (High Sensitivity): 4 ng/L (ref <18)

## 2020-11-18 NOTE — Discharge Instructions (Addendum)
Your labs, urine, EKG today were normal.  Please rest and drink increased fluids over the next 2 days.  Please follow-up with your primary care doctor if symptoms return.  If you develop chest pain, shortness of breath, numbness, weakness on one side of your body, changes in your vision or speech, feel like you are going to pass out or you do pass out, please return to the emergency department.

## 2020-11-18 NOTE — ED Notes (Signed)
Pt ambulatory to bathroom with steady gait and no assistance. Pt reports feeling dizzy only when she bent down in the bathroom. Pt otherwise denies issues while ambulating. Pt provided crackers, peanut butter and sprite per request

## 2020-11-18 NOTE — ED Notes (Signed)
Pt presenting to ED, reports episode of dizziness yesterday as well as near syncopal episode with nausea. Denies chest pain with episode. Reports she fell going up some stairs about a week ago, has scabbed over laceration noted to L lower leg. Pt denies CP, SOB, nausea at this time. Placed in gown, placed on monitoring. Pt AAOx4, NAD, VSS.
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
## 2021-01-09 ENCOUNTER — Other Ambulatory Visit: Payer: Self-pay

## 2021-01-09 ENCOUNTER — Encounter
Admission: RE | Admit: 2021-01-09 | Discharge: 2021-01-09 | Disposition: A | Discharge status: Home / Self Care | Payer: Medicare PPO | Source: Ambulatory Visit | Attending: Orthopedic Surgery | Admitting: Orthopedic Surgery

## 2021-01-09 HISTORY — DX: Personal history of urinary calculi: Z87.442

## 2021-01-09 HISTORY — DX: Chronic obstructive pulmonary disease, unspecified: J44.9

## 2021-01-09 HISTORY — DX: Anxiety disorder, unspecified: F41.9

## 2021-01-09 HISTORY — DX: Pneumonia, unspecified organism: J18.9

## 2021-01-09 NOTE — Patient Instructions (Addendum)
Your procedure is scheduled on: 01/19/21 - Monday Report to the Registration Desk on the 1st floor of the [facility]. To find out your arrival time, please call [phone] between 1PM - 3PM on: 01/16/21 - Friday  REMEMBER: Instructions that are not followed completely may result in serious medical risk, up to and including death; or upon the discretion of your surgeon and anesthesiologist your surgery may need to be rescheduled.  Do not eat food after midnight the night before surgery.  No gum chewing, lozengers or hard candies.  You may however, drink CLEAR liquids up to 2 hours before you are scheduled to arrive for your surgery. Do not drink anything within 2 hours of your scheduled arrival time.  Clear liquids include: - water  - apple juice without pulp - gatorade (not RED, PURPLE, OR BLUE) - black coffee or tea (Do NOT add milk or creamers to the coffee or tea) Do NOT drink anything that is not on this list.  Type 1 and Type 2 diabetics should only drink water.  In addition, your doctor has ordered for you to drink the provided  Ensure Pre-Surgery Clear Carbohydrate Drink Drinking this carbohydrate drink up to two hours before surgery helps to reduce insulin resistance and improve patient outcomes. Please complete drinking 2 hours prior to scheduled arrival time.  TAKE THESE MEDICATIONS THE MORNING OF SURGERY WITH A SIP OF WATER:  -escitalopram (LEXAPRO) 10 MG tablet   One week prior to surgery: Stop Anti-inflammatories (NSAIDS) such as Advil, Aleve, Ibuprofen, Motrin, Naproxen, Naprosyn and Aspirin based products such as Excedrin, Goodys Powder, BC Powder. May take Tylenol as directed if needed.  Stop ANY OVER THE COUNTER supplements until after surgery.  No Alcohol for 24 hours before or after surgery.  No Smoking including e-cigarettes for 24 hours prior to surgery.  No chewable tobacco products for at least 6 hours prior to surgery.  No nicotine patches on the  day of surgery.  Do not use any "recreational" drugs for at least a week prior to your surgery.  Please be advised that the combination of cocaine and anesthesia may have negative outcomes, up to and including death. If you test positive for cocaine, your surgery will be cancelled.  On the morning of surgery brush your teeth with toothpaste and water, you may rinse your mouth with mouthwash if you wish. Do not swallow any toothpaste or mouthwash.  Do not wear jewelry, make-up, hairpins, clips or nail polish.  Do not wear lotions, powders, or perfumes.   Do not shave body from the neck down 48 hours prior to surgery just in case you cut yourself which could leave a site for infection.  Also, freshly shaved skin may become irritated if using the CHG soap.  Contact lenses, hearing aids and dentures may not be worn into surgery.  Do not bring valuables to the hospital. [facility] is not responsible for any missing/lost belongings or valuables.   Use CHG Soap or wipes as directed on instruction sheet.  Notify your doctor if there is any change in your medical condition (cold, fever, infection).  Wear comfortable clothing (specific to your surgery type) to the hospital.  Plan for stool softeners for home use; pain medications have a tendency to cause constipation. You can also help prevent constipation by eating foods high in fiber such as fruits and vegetables and drinking plenty of fluids as your diet allows.  After surgery, you can help prevent lung complications by doing  breathing exercises.  Take deep breaths and cough every 1-2 hours. Your doctor may order a device called an Incentive Spirometer to help you take deep breaths. When coughing or sneezing, hold a pillow firmly against your incision with both hands. This is called "splinting." Doing this helps protect your incision. It also decreases belly discomfort.  If you are being admitted to the hospital overnight, leave your  suitcase in the car. After surgery it may be brought to your room.  If you are being discharged the day of surgery, you will not be allowed to drive home. You will need a responsible adult (18 years or older) to drive you home and stay with you that night.   If you are taking public transportation, you will need to have a responsible adult (18 years or older) with you. Please confirm with your physician that it is acceptable to use public transportation.   Please call the [facility] Dept. at [phone] if you have any questions about these instructions.  Surgery Visitation Policy:  Patients undergoing a surgery or procedure may have one family member or support person with them as long as that person is not COVID-19 positive or experiencing its symptoms.  That person may remain in the waiting area during the procedure.  Inpatient Visitation:    Visiting hours are 7 a.m. to 8 p.m. Inpatients will be allowed two visitors daily. The visitors may change each day during the patient's stay. No visitors under the age of 30. Any visitor under the age of 12 must be accompanied by an adult. The visitor must pass COVID-19 screenings, use hand sanitizer when entering and exiting the patient's room and wear a mask at all times, including in the patient's room. Patients must also wear a mask when staff or their visitor are in the room. Masking is required regardless of vaccination status.

## 2021-01-15 ENCOUNTER — Other Ambulatory Visit: Admission: RE | Admit: 2021-01-15 | Payer: Medicare PPO | Source: Ambulatory Visit

## 2021-01-18 NOTE — H&P (Signed)
ORTHOPAEDIC HISTORY & PHYSICAL Darlene Hubbard, [place] - 01/09/2021 3:45 PM EDT Formatting of this note is different from the original. [facility] Chief Complaint:   Chief Complaint  Patient presents with  . Knee Pain  H & P LEFT PATELLA   History of Present Illness:   Darlene Hubbard is a 70 y.o. female that presents to clinic today for her preoperative history and evaluation. Patient presents unaccompanied. The patient is scheduled to undergo a hardware removal from the left patella by Dr. Marry Guan on 08/21/2021. Patient is previously status post ORIF left patella fracture 06/02/2017. Patient did well postoperatively but has had lingering symptoms over an area of prominent hardware over the anterior knee.  The patient's symptoms have progressed to the point that they decrease her quality of life.  Patient denies any hardware in the lumbar spine, denies significant cardiac history. She previously had DVT following the fracture of the patella.   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   Past Medical, Surgical, Family, Social History, Allergies, Medications:   Past Medical History:  Past Medical History:  Diagnosis Date  . Allergy Mold, cats  . Anxiety  . Asthma with bronchitis 12/22/2013  Mild to moderate late onset  . COPD (chronic obstructive pulmonary disease) (CMS-HCC) 09/02/2019  . Depression Depression at times  . GERD (gastroesophageal reflux disease)  . History of nephrolithiasis  . Hyperlipidemia, mixed  . IBS (irritable bowel syndrome)  . Kidney stone 08/20/2019  . LPRD (laryngopharyngeal reflux disease) 12/22/2013  . Rosacea  . Sleep apnea 12/22/2013  . Spinal stenosis   Past Surgical History:  Past Surgical History:  Procedure Laterality Date  . APPENDECTOMY  . BACK SURGERY  . BREAST EXCISIONAL BIOPSY  . BREAST LUMP  . CATARACT EXTRACTION Several years agp  . CHOLECYSTECTOMY  .  COLONOSCOPY  . FRACTURE SURGERY Knee cap 2017  . HYSTERECTOMY  for abnormal uterine bleeding  . LAMINECTOMY LUMBAR SPINE  . LITHOTRIPSY 2020  . Open reduction and internal fixation of the left patella 06/02/2016  Dr Marry Guan  . RECTAL POLYPS REMOVED 04/23/2002  Dr. Bary Castilla  . TUBAL LIGATION Bilateral  Bilateral  . upper endoscopy  . vein closure procedure   Current Medications:  Current Outpatient Medications  Medication Sig Dispense Refill  . acetaminophen (TYLENOL) 500 MG tablet Take 1,000 mg by mouth once daily as needed  . albuterol 90 mcg/actuation inhaler 2 puffs q.i.d. p.r.n. short of breath, wheezing, or cough (Patient taking differently: Inhale 1 inhalation into the lungs every 6 (six) hours as needed 2 puffs q.i.d. p.r.n. short of breath, wheezing, or cough) 18 g 3  . ALPRAZolam (XANAX) 0.5 MG tablet TAKE ONE TABLET BY MOUTH AT BEDTIME AS NEEDED 90 tablet 1  . EPINEPHrine (EPIPEN) 0.3 mg/0.3 mL pen injector Inject 0.3 mg into the muscle as needed  . escitalopram oxalate (LEXAPRO) 10 MG tablet Take 10 mg by mouth once daily  . fluticasone propion-salmeteroL (ADVAIR DISKUS) 250-50 mcg/dose diskus inhaler Inhale 1 inhalation into the lungs every 12 (twelve) hours as needed  . levalbuterol (XOPENEX) 1.25 mg/3 mL nebulizer solution Take 3 mLs (1.25 mg total) by nebulization every 6 (six) hours as needed for Wheezing. 24 ampule 11  . mirabegron (MYRBETRIQ) 25 mg ER Tablet Take 1 tablet (25 mg total) by mouth once daily Every other day  . montelukast (SINGULAIR) 10 mg tablet Take 1 tablet (10 mg total) by  mouth once daily   No current facility-administered medications for this visit.   Allergies:  Allergies  Allergen Reactions  . Mold Extracts Shortness Of Breath  . Valsartan Rash  Edema  . Cat/Feline Products Itching  sneezing  . Doxycycline Vomiting  . Sulfa (Sulfonamide Antibiotics) Itching and Rash  . Trovan [Alatrofloxacin] Rash   Social History:  Social History    Socioeconomic History  . Marital status: Widowed  . Number of children: 2  . Years of education: 16  . Highest education level: Bachelor's degree (e.g., BA, AB, BS)  Occupational History  . Occupation: Full-time - Pharmacist, hospital  Tobacco Use  . Smoking status: Never Smoker  . Smokeless tobacco: Never Used  Vaping Use  . Vaping Use: Never used  Substance and Sexual Activity  . Alcohol use: No  . Drug use: No  . Sexual activity: Not Currently  Partners: Male   Family History:  Family History  Problem Relation Age of Onset  . Clotting disorder Mother  . Colon cancer Father  . Leukemia Sister  . Stomach cancer Maternal Grandmother  . Heart disease Maternal Grandfather  . Emphysema Paternal Grandfather   Review of Systems:   A 10+ ROS was performed, reviewed, and the pertinent orthopaedic findings are documented in the HPI.   Physical Examination:   BP 110/80 (BP Location: Left upper arm, Patient Position: Sitting, BP Cuff Size: Adult)  Ht 160 cm (5\' 3" )  Wt 73.2 kg (161 lb 6.4 oz)  BMI 28.59 kg/m   Patient is a well-developed, well-nourished female in no acute distress. Patient has normal mood and affect. Patient is alert and oriented to person, place, and time.   HEENT: Atraumatic, normocephalic. Pupils equal and reactive to light. Extraocular motion intact. Noninjected sclera.  Cardiovascular: Regular rate and rhythm, with no murmurs, rubs, or gallops. Distal pulses palpable.  Respiratory: Lungs clear to auscultation bilaterally.   Left knee: Surgical incision is well-healed except for small healing abrasion noted over the proximal portion. The patient demonstrates 138 degreesof flexion but lacks 3 degrees of extension.Good quadriceps tone is noted withoutany significantquadriceps atrophy. Good patellar mobility. No significant extensor lag. The knee is stable to varus valgus stress. No tenderness to palpation about the patella. Patellar grind test is negative.  There is no appreciable swelling to the knee. There is a prominence in the region of the screw heads, although no gross tenderness is appreciated.  138, lacks 3 degrees of extension  Sensation intact over the saphenous, lateral sural cutaneous, superficial fibular, and deep fibular nerve distributions.  Tests Performed/Reviewed:  X-rays  No new radiographs were obtained today. Previous radiographs were reviewed of the left knee and revealed well healed patella fracture. Prominent retained hardware is noted.  Impression:   ICD-10-CM  1. H/O fracture of patella Z87.81   Plan:   The patient's exam and x-rays are consistent with painful retained hardware in the left patella. The patient will undergo hardware removal with Dr. Marry Guan. The risks of surgery, including blood clot and infection, were discussed with the patient. The patient elects to proceed with surgery. The patient is instructed to stop all blood thinners prior to surgery. The patient is instructed to call the hospital the day before surgery to learn of the proper arrival time.   Contact our office with any questions or concerns. Follow up as indicated, or sooner should any new problems arise, if conditions worsen, or if they are otherwise concerned.   Darlene Fudge, PA -[facility] [place], [place] [address] Phone: [phone]  This note was generated in part with voice recognition software and I apologize for any typographical errors that were not detected and corrected.  Electronically signed by Darlene Fudge, PA at 01/13/2021 6:11 PM EDT

## 2021-01-19 ENCOUNTER — Encounter
Admission: RE | Disposition: A | Discharge status: Home / Self Care | Payer: Self-pay | Source: Home / Self Care | Attending: Orthopedic Surgery

## 2021-01-19 ENCOUNTER — Ambulatory Visit
Admission: RE | Admit: 2021-01-19 | Discharge: 2021-01-19 | Disposition: A | Discharge status: Home / Self Care | Payer: Medicare PPO | Attending: Orthopedic Surgery | Admitting: Orthopedic Surgery

## 2021-01-19 ENCOUNTER — Encounter: Payer: Self-pay | Admitting: Orthopedic Surgery

## 2021-01-19 ENCOUNTER — Ambulatory Visit: Payer: Medicare PPO | Admitting: Certified Registered Nurse Anesthetist

## 2021-01-19 DIAGNOSIS — Z86718 Personal history of other venous thrombosis and embolism: Secondary | ICD-10-CM | POA: Diagnosis not present

## 2021-01-19 DIAGNOSIS — Z79899 Other long term (current) drug therapy: Secondary | ICD-10-CM | POA: Diagnosis not present

## 2021-01-19 DIAGNOSIS — Z8249 Family history of ischemic heart disease and other diseases of the circulatory system: Secondary | ICD-10-CM | POA: Diagnosis not present

## 2021-01-19 DIAGNOSIS — X58XXXA Exposure to other specified factors, initial encounter: Secondary | ICD-10-CM | POA: Diagnosis not present

## 2021-01-19 DIAGNOSIS — Z7951 Long term (current) use of inhaled steroids: Secondary | ICD-10-CM | POA: Insufficient documentation

## 2021-01-19 DIAGNOSIS — T8484XA Pain due to internal orthopedic prosthetic devices, implants and grafts, initial encounter: Secondary | ICD-10-CM | POA: Insufficient documentation

## 2021-01-19 DIAGNOSIS — E782 Mixed hyperlipidemia: Secondary | ICD-10-CM | POA: Insufficient documentation

## 2021-01-19 DIAGNOSIS — Z862 Personal history of diseases of the blood and blood-forming organs and certain disorders involving the immune mechanism: Secondary | ICD-10-CM | POA: Insufficient documentation

## 2021-01-19 DIAGNOSIS — Z881 Allergy status to other antibiotic agents status: Secondary | ICD-10-CM | POA: Insufficient documentation

## 2021-01-19 DIAGNOSIS — Z882 Allergy status to sulfonamides status: Secondary | ICD-10-CM | POA: Diagnosis not present

## 2021-01-19 DIAGNOSIS — G4730 Sleep apnea, unspecified: Secondary | ICD-10-CM | POA: Diagnosis not present

## 2021-01-19 DIAGNOSIS — Z8719 Personal history of other diseases of the digestive system: Secondary | ICD-10-CM | POA: Diagnosis not present

## 2021-01-19 DIAGNOSIS — Z9049 Acquired absence of other specified parts of digestive tract: Secondary | ICD-10-CM | POA: Diagnosis not present

## 2021-01-19 DIAGNOSIS — Z90710 Acquired absence of both cervix and uterus: Secondary | ICD-10-CM | POA: Insufficient documentation

## 2021-01-19 DIAGNOSIS — Z9889 Other specified postprocedural states: Secondary | ICD-10-CM

## 2021-01-19 HISTORY — PX: HARDWARE REMOVAL: SHX979

## 2021-01-19 SURGERY — REMOVAL, HARDWARE
Anesthesia: General | Site: Knee | Laterality: Left

## 2021-01-19 MED ORDER — CEFAZOLIN SODIUM-DEXTROSE 2-4 GM/100ML-% IV SOLN
2.0000 g | INTRAVENOUS | Status: AC
  Administered 2021-01-19: 2 g via INTRAVENOUS

## 2021-01-19 MED ORDER — ONDANSETRON HCL 4 MG/2ML IJ SOLN
INTRAMUSCULAR | Status: DC | PRN
  Administered 2021-01-19: 4 mg via INTRAVENOUS

## 2021-01-19 MED ORDER — FAMOTIDINE 20 MG PO TABS: 20.0000 mg | ORAL_TABLET | Freq: Once | ORAL | Status: AC

## 2021-01-19 MED ORDER — FAMOTIDINE 20 MG PO TABS
ORAL_TABLET | ORAL | Status: AC
  Administered 2021-01-19: 20 mg via ORAL
  Filled 2021-01-19: qty 1

## 2021-01-19 MED ORDER — CHLORHEXIDINE GLUCONATE 0.12 % MT SOLN
OROMUCOSAL | Status: AC
  Administered 2021-01-19: 15 mL via OROMUCOSAL
  Filled 2021-01-19: qty 15

## 2021-01-19 MED ORDER — ONDANSETRON HCL 4 MG/2ML IJ SOLN: 4.0000 mg | Freq: Once | INTRAMUSCULAR | Status: DC | PRN

## 2021-01-19 MED ORDER — FENTANYL CITRATE (PF) 100 MCG/2ML IJ SOLN
INTRAMUSCULAR | Status: AC
  Filled 2021-01-19: qty 2

## 2021-01-19 MED ORDER — PHENYLEPHRINE HCL (PRESSORS) 10 MG/ML IV SOLN
INTRAVENOUS | Status: DC | PRN
  Administered 2021-01-19 (×3): 100 ug via INTRAVENOUS
  Administered 2021-01-19 (×2): 50 ug via INTRAVENOUS

## 2021-01-19 MED ORDER — NEOMYCIN-POLYMYXIN B GU 40-200000 IR SOLN
Status: DC | PRN
  Administered 2021-01-19: 2 mL

## 2021-01-19 MED ORDER — PROPOFOL 10 MG/ML IV BOLUS
INTRAVENOUS | Status: DC | PRN
  Administered 2021-01-19: 100 mg via INTRAVENOUS

## 2021-01-19 MED ORDER — FENTANYL CITRATE (PF) 100 MCG/2ML IJ SOLN: 25.0000 ug | INTRAMUSCULAR | Status: DC | PRN

## 2021-01-19 MED ORDER — CHLORHEXIDINE GLUCONATE 4 % EX LIQD: 60.0000 mL | Freq: Once | CUTANEOUS | Status: DC

## 2021-01-19 MED ORDER — ENSURE PRE-SURGERY PO LIQD
296.0000 mL | Freq: Once | ORAL | Status: DC
  Filled 2021-01-19: qty 296

## 2021-01-19 MED ORDER — LIDOCAINE HCL (CARDIAC) PF 100 MG/5ML IV SOSY
PREFILLED_SYRINGE | INTRAVENOUS | Status: DC | PRN
  Administered 2021-01-19: 80 mg via INTRAVENOUS

## 2021-01-19 MED ORDER — CELECOXIB 200 MG PO CAPS
ORAL_CAPSULE | ORAL | Status: AC
  Administered 2021-01-19: 400 mg via ORAL
  Filled 2021-01-19: qty 2

## 2021-01-19 MED ORDER — ACETAMINOPHEN 10 MG/ML IV SOLN
INTRAVENOUS | Status: DC | PRN
  Administered 2021-01-19: 1000 mg via INTRAVENOUS

## 2021-01-19 MED ORDER — LACTATED RINGERS IV SOLN: INTRAVENOUS | Status: DC

## 2021-01-19 MED ORDER — SODIUM CHLORIDE 0.9 % IV SOLN: INTRAVENOUS | Status: DC

## 2021-01-19 MED ORDER — HYDROCODONE-ACETAMINOPHEN 5-325 MG PO TABS: 1.0000 | ORAL_TABLET | ORAL | 0 refills | Status: DC | PRN

## 2021-01-19 MED ORDER — ORAL CARE MOUTH RINSE: 15.0000 mL | Freq: Once | OROMUCOSAL | Status: AC

## 2021-01-19 MED ORDER — CHLORHEXIDINE GLUCONATE 0.12 % MT SOLN: 15.0000 mL | Freq: Once | OROMUCOSAL | Status: AC

## 2021-01-19 MED ORDER — BUPIVACAINE HCL (PF) 0.25 % IJ SOLN
INTRAMUSCULAR | Status: DC | PRN
  Administered 2021-01-19: 3 mL

## 2021-01-19 MED ORDER — ACETAMINOPHEN 10 MG/ML IV SOLN
INTRAVENOUS | Status: AC
  Filled 2021-01-19: qty 100

## 2021-01-19 MED ORDER — GLYCOPYRROLATE 0.2 MG/ML IJ SOLN
INTRAMUSCULAR | Status: DC | PRN
  Administered 2021-01-19: .2 mg via INTRAVENOUS

## 2021-01-19 MED ORDER — DEXAMETHASONE SODIUM PHOSPHATE 10 MG/ML IJ SOLN
INTRAMUSCULAR | Status: DC | PRN
  Administered 2021-01-19: 10 mg via INTRAVENOUS

## 2021-01-19 MED ORDER — FENTANYL CITRATE (PF) 100 MCG/2ML IJ SOLN
INTRAMUSCULAR | Status: DC | PRN
  Administered 2021-01-19 (×4): 25 ug via INTRAVENOUS

## 2021-01-19 MED ORDER — METOCLOPRAMIDE HCL 5 MG/ML IJ SOLN: 5.0000 mg | Freq: Three times a day (TID) | INTRAMUSCULAR | Status: DC | PRN

## 2021-01-19 MED ORDER — CELECOXIB 200 MG PO CAPS: 400.0000 mg | ORAL_CAPSULE | Freq: Once | ORAL | Status: AC

## 2021-01-19 MED ORDER — METOCLOPRAMIDE HCL 10 MG PO TABS: 5.0000 mg | ORAL_TABLET | Freq: Three times a day (TID) | ORAL | Status: DC | PRN

## 2021-01-19 MED ORDER — ONDANSETRON HCL 4 MG PO TABS: 4.0000 mg | ORAL_TABLET | Freq: Four times a day (QID) | ORAL | Status: DC | PRN

## 2021-01-19 MED ORDER — CEFAZOLIN SODIUM-DEXTROSE 2-4 GM/100ML-% IV SOLN
INTRAVENOUS | Status: AC
  Filled 2021-01-19: qty 100

## 2021-01-19 MED ORDER — ONDANSETRON HCL 4 MG/2ML IJ SOLN: 4.0000 mg | Freq: Four times a day (QID) | INTRAMUSCULAR | Status: DC | PRN

## 2021-01-19 SURGICAL SUPPLY — 32 items
BNDG COHESIVE 4X5 TAN STRL (GAUZE/BANDAGES/DRESSINGS) ×2 IMPLANT
BNDG ELASTIC 4X5.8 VLCR STR LF (GAUZE/BANDAGES/DRESSINGS) ×2 IMPLANT
DRAPE POUCH INSTRU U-SHP 10X18 (DRAPES) ×1 IMPLANT
DRAPE U-SHAPE 47X51 STRL (DRAPES) ×2 IMPLANT
DRSG DERMACEA 8X12 NADH (GAUZE/BANDAGES/DRESSINGS) ×2 IMPLANT
DRSG OPSITE POSTOP 3X4 (GAUZE/BANDAGES/DRESSINGS) ×1 IMPLANT
DURAPREP 26ML APPLICATOR (WOUND CARE) ×2 IMPLANT
ELECT CAUTERY BLADE 6.4 (BLADE) ×2 IMPLANT
ELECT REM PT RETURN 9FT ADLT (ELECTROSURGICAL) ×2
ELECTRODE REM PT RTRN 9FT ADLT (ELECTROSURGICAL) ×1 IMPLANT
GLOVE SURG ENC TEXT LTX SZ7.5 (GLOVE) ×2 IMPLANT
GLOVE SURG UNDER LTX SZ8 (GLOVE) ×2 IMPLANT
GOWN STRL REUS W/ TWL LRG LVL3 (GOWN DISPOSABLE) ×2 IMPLANT
GOWN STRL REUS W/TWL LRG LVL3 (GOWN DISPOSABLE) ×4
KIT TURNOVER KIT A (KITS) ×2 IMPLANT
LABEL OR SOLS (LABEL) ×2 IMPLANT
MANIFOLD NEPTUNE II (INSTRUMENTS) ×2 IMPLANT
PACK EXTREMITY ARMC (MISCELLANEOUS) ×1 IMPLANT
PAD ABD DERMACEA PRESS 5X9 (GAUZE/BANDAGES/DRESSINGS) ×2 IMPLANT
PAD CAST CTTN 4X4 STRL (SOFTGOODS) ×3 IMPLANT
PADDING CAST COTTON 4X4 STRL (SOFTGOODS) ×6
SOL PREP PVP 2OZ (MISCELLANEOUS) ×2
SOLUTION PREP PVP 2OZ (MISCELLANEOUS) ×1 IMPLANT
STAPLER SKIN PROX 35W (STAPLE) ×2 IMPLANT
STOCKINETTE STRL 6IN 960660 (GAUZE/BANDAGES/DRESSINGS) ×2 IMPLANT
SUT ETHILON 3-0 FS-10 30 BLK (SUTURE) ×2
SUT VIC AB 0 CT1 36 (SUTURE) ×4 IMPLANT
SUT VIC AB 2-0 SH 27 (SUTURE) ×2
SUT VIC AB 2-0 SH 27XBRD (SUTURE) ×1 IMPLANT
SUTURE EHLN 3-0 FS-10 30 BLK (SUTURE) IMPLANT
SYR 20ML LL LF (SYRINGE) ×2 IMPLANT
WRAP KNEE W/COLD PACKS 25.5X14 (SOFTGOODS) ×1 IMPLANT

## 2021-01-19 NOTE — Anesthesia Postprocedure Evaluation (Signed)
Anesthesia Post Note  Patient: Darlene Hubbard  Procedure(s) Performed: HARDWARE REMOVAL (Left Knee)  Patient location during evaluation: PACU Anesthesia Type: General Level of consciousness: awake and alert Pain management: pain level controlled Vital Signs Assessment: post-procedure vital signs reviewed and stable Respiratory status: spontaneous breathing, nonlabored ventilation, respiratory function stable and patient connected to nasal cannula oxygen Cardiovascular status: blood pressure returned to baseline and stable Postop Assessment: no apparent nausea or vomiting Anesthetic complications: no   No complications documented.   Last Vitals:  Vitals:   01/19/21 1809 01/19/21 1815  BP: 134/68   Pulse: 75 76  Resp: 16   Temp: 36.4 C   SpO2: 97% 98%    Last Pain:  Vitals:   01/19/21 1809  TempSrc:   PainSc: 0-No pain                 Arita Miss

## 2021-01-19 NOTE — Anesthesia Procedure Notes (Signed)
Procedure Name: LMA Insertion Date/Time: 01/19/2021 3:37 PM Performed by: Lily Peer, Zuria Fosdick, CRNA Pre-anesthesia Checklist: Patient identified, Emergency Drugs available, Suction available and Patient being monitored Patient Re-evaluated:Patient Re-evaluated prior to induction Oxygen Delivery Method: Circle system utilized Preoxygenation: Pre-oxygenation with 100% oxygen Induction Type: IV induction Ventilation: Mask ventilation without difficulty LMA: LMA inserted LMA Size: 3.5 Number of attempts: 1 Placement Confirmation: positive ETCO2 and breath sounds checked- equal and bilateral Tube secured with: Tape Dental Injury: Teeth and Oropharynx as per pre-operative assessment

## 2021-01-19 NOTE — Discharge Instructions (Signed)
           Instructions after Knee Surgery    James P. Hooten, Jr., M.D.     Dept. of Orthopaedics & Sports Medicine  [facility]  [address]  [place], [place]  [address]   Phone: [phone]   Fax: [phone]   DIET: . Drink plenty of non-alcoholic fluids & begin a light diet. . Resume your normal diet the day after surgery.  ACTIVITY:  . You may use crutches or a walker with weight-bearing as tolerated, unless instructed otherwise. . You may wean yourself off of the walker or crutches as tolerated.  . Begin doing gentle exercises. Exercising will reduce the pain and swelling, increase motion, and prevent muscle weakness.   . Avoid strenuous activities or athletics for a minimum of 4-6 weeks after surgery. . Do not drive or operate any equipment until instructed.  WOUND CARE:  . Place one to two pillows under the knee the first day or two when sitting or lying.  . Continue to use the ice packs periodically to reduce pain and swelling. . The small incisions in your knee are closed with nylon stitches. The stitches will be removed in the office. . The bulky dressing may be removed on the second day after surgery. DO NOT TOUCH THE STITCHES. Put a Band-Aid over each stitch. Do NOT use any ointments or creams on the incisions.  . You may bathe or shower after the stitches are removed at the first office visit following surgery.  MEDICATIONS: . You may resume your regular medications. . Please take the pain medication as prescribed. . Do not take pain medication on an empty stomach. . Do not drive or drink alcoholic beverages when taking pain medications.  CALL THE OFFICE FOR: . Temperature above 101 degrees . Excessive bleeding or drainage on the dressing. . Excessive swelling, coldness, or paleness of the toes. . Persistent nausea and vomiting.  FOLLOW-UP:  . You should have an appointment to return to the office in 7-10 days after surgery.       [facility] Department Directory         [email or web address]       [email or web address]          Cardiology  Appointments: [place] - [phone] [place] - [phone]  Endocrinology  Appointments: [place] - [phone] [place] - [phone]  Gastroenterology  Appointments: [place] - [phone] [place] - [phone]        General Surgery   Appointments: [place] - [phone]  Internal Medicine/Family Medicine  Appointments: [place] - [phone] [place] - [phone] [place] - [phone]  Metabolic and Weigh Loss Surgery  Appointments: [place] - [phone]        Neurology  Appointments: [place] - [phone] [place] - [phone]  Neurosurgery  Appointments: [place] - [phone]  Obstetrics & Gynecology  Appointments: [place] - [phone] [place] - [phone]        Pediatrics  Appointments: [place] - [phone] [place] - [phone]  Physiatry  Appointments: [place] -[phone]  Physical Therapy  Appointments: [place] - [phone] [place] - [phone]        Podiatry  Appointments: [place] - [phone] [place] - [phone]  Pulmonology  Appointments: [place] - [phone]  Rheumatology  Appointments: [place] - [phone]        [place] Location: [facility]  [address] , [place]  [address]  [place] Location: [facility] [address] [place], [place]  [address]  [place] Location: [facility] [address] [place], [place]    [address]   AMBULATORY SURGERY  DISCHARGE INSTRUCTIONS   1) The drugs that you were given will stay in your system until tomorrow so for the next 24 hours you should not:  A) Drive an automobile B) Make any legal decisions C) Drink any alcoholic beverage   2) You may resume regular meals tomorrow.  Today it is better to start with liquids and gradually work up to solid  foods.  You may eat anything you prefer, but it is better to start with liquids, then soup and crackers, and gradually work up to solid foods.   3) Please notify your doctor immediately if you have any unusual bleeding, trouble breathing, redness and pain at the surgery site, drainage, fever, or pain not relieved by medication.    4) Additional Instructions:        Please contact your physician with any problems or Same Day Surgery at [phone], Monday through Friday 6 am to 4 pm, or [place] at [place] Main number at [phone].  

## 2021-01-19 NOTE — Anesthesia Preprocedure Evaluation (Addendum)
Anesthesia Evaluation  Patient identified by MRN, date of birth, ID band Patient awake    Reviewed: Allergy & Precautions, H&P , NPO status , Patient's Chart, lab work & pertinent test results  History of Anesthesia Complications Negative for: history of anesthetic complications  Airway Mallampati: III  TM Distance: <3 FB Neck ROM: full    Dental  (+) Chipped   Pulmonary neg shortness of breath, asthma , sleep apnea , COPD,  COPD inhaler,           Cardiovascular Exercise Tolerance: Good (-) angina(-) Past MI and (-) DOE negative cardio ROS       Neuro/Psych  Neuromuscular disease negative psych ROS   GI/Hepatic Neg liver ROS, hiatal hernia, GERD  Medicated and Controlled,  Endo/Other  negative endocrine ROS  Renal/GU stones     Musculoskeletal  (+) Fibromyalgia -  Abdominal   Peds negative pediatric ROS (+)  Hematology negative hematology ROS (+)   Anesthesia Other Findings Past Medical History: No date: Allergy No date: Asthma No date: Bursitis     Comment:  BOTH SHOULDER AND HIPS No date: Fibromyalgia No date: GERD (gastroesophageal reflux disease) No date: Hiatal hernia No date: IBS (irritable bowel syndrome) No date: Mild obstructive sleep apnea     Comment:  does not use Cpap No date: Morbid obesity ([place]) No date: Spinal stenosis No date: Varicose veins  Past Surgical History: 1993: ABDOMINAL HYSTERECTOMY 1995: APPENDECTOMY No date: BACK SURGERY 2000: BREAST BIOPSY; Left     Comment:  neg No date: CHOLECYSTECTOMY 2008: COLONOSCOPY 02/18/2016: COLONOSCOPY WITH PROPOFOL; N/A     Comment:  Procedure: COLONOSCOPY WITH PROPOFOL;  Surgeon: [name], MD;  Location: [facility] ENDOSCOPY;  Service:               Endoscopy;  Laterality: N/A; 06/02/2016: ORIF PATELLA; Left     Comment:  Procedure: OPEN REDUCTION INTERNAL (ORIF) FIXATION               PATELLA;  Surgeon: Dereck Leep,  MD;  Location: [facility]               ORS;  Service: Orthopedics;  Laterality: Left; No date: PARTIAL HYSTERECTOMY 06/02/2016: PATELLAR TENDON REPAIR; Left     Comment:  Procedure: PATELLA TENDON REPAIR;  Surgeon: Dereck Leep, MD;  Location: [facility] ORS;  Service: Orthopedics;                Laterality: Left; No date: TUBAL LIGATION 2008: UPPER GI ENDOSCOPY 2010: vein closure procedure; Bilateral  BMI    Body Mass Index: 28.90 kg/m      Reproductive/Obstetrics negative OB ROS                             Anesthesia Physical  Anesthesia Plan  ASA: III  Anesthesia Plan: General   Post-op Pain Management:    Induction: Intravenous  PONV Risk Score and Plan:   Airway Management Planned: LMA  Additional Equipment:   Intra-op Plan:   Post-operative Plan: Extubation in OR  Informed Consent: I have reviewed the patients History and Physical, chart, labs and discussed the procedure including the risks, benefits and alternatives for the proposed anesthesia with the patient or authorized representative who has indicated his/her understanding and acceptance.  Dental Advisory Given  Plan Discussed with: Anesthesiologist, CRNA and Surgeon  Anesthesia Plan Comments: (Patient consented for risks of anesthesia including but not limited to:  - adverse reactions to medications - damage to teeth, lips or other oral mucosa - sore throat or hoarseness - Damage to heart, brain, lungs or loss of life  Patient voiced understanding.)       Anesthesia Quick Evaluation

## 2021-01-19 NOTE — Op Note (Signed)
OPERATIVE NOTE  DATE OF SURGERY:  01/19/2021  PATIENT NAME:  Darlene Hubbard   DOB: 1951-05-20  MRN: [id number]   PRE-OPERATIVE DIAGNOSIS: Painful retained screws status post ORIF of left patella fracture  POST-OPERATIVE DIAGNOSIS:  Same  PROCEDURE: Removal of two 4.0 mm cannulated partially threaded cancellous screws from the left patella  SURGEON:  Marciano Sequin., M.D.   ASSISTANT: None  ANESTHESIA: general  ESTIMATED BLOOD LOSS: Minimal  FLUIDS REPLACED: 700 mL of crystalloid  TOURNIQUET TIME: Not used  DRAINS: None  INDICATIONS FOR SURGERY: [name] is a 70 y.o. year old female who had a past history of open reduction internal fixation of left patella fracture several years ago.  The fracture healed uneventfully.  The patient has had some discomfort due to the prominence of the screw heads. After discussion of the risks and benefits of surgical intervention, the patient expressed understanding of the risks benefits and agree with plans for removal of the retained screws.   PROCEDURE IN DETAIL: The patient was brought into the operating room and, after adequate general anesthesia was achieved, the patient's left knee and leg were cleaned and prepped with alcohol and DuraPrep and draped in usual sterile fashion.  A "timeout" was performed as per usual protocol.  With the knee flexed, the screw heads were easily palpable along the inferior aspect of the patella.  2 longitudinal incisions were made directly over the screw heads.  Dissection was carried down to the screw heads and the Fibertape was incised.  The screws were removed using a T 15 screwdriver.  The Fibertape not along the lateral screw was also removed.  The wounds were irrigated with copious amounts of normal saline with antibiotic solution.  Hemostasis was achieved.  The wound edges were reapproximated using vertical mattress sutures of #3-0 nylon.  A sterile dressing was applied.  The patient tolerated  procedure well.  She was transported to the recovery room in stable condition.   [name] P. Holley Bouche M.D.

## 2021-01-19 NOTE — H&P (Signed)
The patient has been re-examined, and the chart reviewed, and there have been no interval changes to the documented history and physical.    The risks, benefits, and alternatives have been discussed at length. The patient expressed understanding of the risks benefits and agreed with plans for surgical intervention.  Concettina Leth P. Sydnie Sigmund, Jr. M.D.    

## 2021-01-19 NOTE — Transfer of Care (Signed)
Immediate Anesthesia Transfer of Care Note  Patient: Darlene Hubbard  Procedure(s) Performed: HARDWARE REMOVAL (Left Knee)  Patient Location: PACU  Anesthesia Type:General  Level of Consciousness: awake, alert  and oriented  Airway & Oxygen Therapy: Patient Spontanous Breathing  Post-op Assessment: Report given to RN and Post -op Vital signs reviewed and stable  Post vital signs: Reviewed and stable  Last Vitals:  Vitals Value Taken Time  BP 140/82   Temp    Pulse 105 01/19/21 1720  Resp 17 01/19/21 1720  SpO2 96 % 01/19/21 1720  Vitals shown include unvalidated device data.  Last Pain:  Vitals:   01/19/21 1343  TempSrc: Temporal  PainSc: 0-No pain         Complications: No complications documented.

## 2021-01-20 ENCOUNTER — Encounter: Payer: Self-pay | Admitting: Orthopedic Surgery

## 2021-03-12 ENCOUNTER — Other Ambulatory Visit: Payer: Self-pay | Admitting: Internal Medicine

## 2021-03-12 DIAGNOSIS — Z1231 Encounter for screening mammogram for malignant neoplasm of breast: Secondary | ICD-10-CM

## 2021-03-16 ENCOUNTER — Other Ambulatory Visit: Payer: Self-pay

## 2021-03-16 ENCOUNTER — Ambulatory Visit (INDEPENDENT_AMBULATORY_CARE_PROVIDER_SITE_OTHER): Payer: Medicare PPO | Admitting: Vascular Surgery

## 2021-03-16 VITALS — BP 115/73 | HR 78 | Ht 63.0 in | Wt 165.0 lb

## 2021-03-16 DIAGNOSIS — I83819 Varicose veins of unspecified lower extremities with pain: Secondary | ICD-10-CM

## 2021-03-16 DIAGNOSIS — E782 Mixed hyperlipidemia: Secondary | ICD-10-CM

## 2021-03-16 DIAGNOSIS — J449 Chronic obstructive pulmonary disease, unspecified: Secondary | ICD-10-CM | POA: Diagnosis not present

## 2021-03-16 DIAGNOSIS — I872 Venous insufficiency (chronic) (peripheral): Secondary | ICD-10-CM | POA: Diagnosis not present

## 2021-03-17 ENCOUNTER — Encounter (INDEPENDENT_AMBULATORY_CARE_PROVIDER_SITE_OTHER): Payer: Self-pay | Admitting: Vascular Surgery

## 2021-03-17 NOTE — Progress Notes (Signed)
MRN : [id number]  Darlene Hubbard is a 70 y.o. (12/01/1950) female who presents with chief complaint of  Chief Complaint  Patient presents with   Follow-up    Est pt from 2/21 pt never had FU post ablation  no studies  .  History of Present Illness:  The patient returns to the office for followup status post laser ablation of the left great saphenous vein on 09/27/2019.  The patient note significant improvement in the lower extremity pain but not resolution of the symptoms. The patient notes multiple residual varicosities bilaterally which continued to hurt with dependent positions and remained tender to palpation. The patient's swelling is minimally from preoperative status. The patient continues to wear graduated compression stockings on a daily basis but these are not eliminating the pain and discomfort. The patient continues to use over-the-counter anti-inflammatory medications to treat the pain and related symptoms but this has not given the patient relief. The patient notes the pain in the lower extremities is causing problems with daily exercise, problems at work and even with household activities such as preparing meals and doing dishes.  The patient is otherwise done well and there have been no complications related to the laser procedure or interval changes in the patient's overall      Current Meds  Medication Sig   acetaminophen (TYLENOL) 500 MG tablet Take by mouth.   albuterol (VENTOLIN HFA) 108 (90 Base) MCG/ACT inhaler Inhale into the lungs.   ALPRAZolam (XANAX) 0.5 MG tablet Take 0.5 mg by mouth at bedtime.    EPINEPHrine 0.3 mg/0.3 mL IJ SOAJ injection Inject 0.3 mg into the muscle as needed for anaphylaxis.   escitalopram (LEXAPRO) 10 MG tablet Take 10 mg by mouth daily.   fluticasone-salmeterol (ADVAIR) 250-50 MCG/ACT AEPB Inhale 1 puff into the lungs 2 (two) times daily as needed (respiratory issues.).    HYDROcodone-acetaminophen (NORCO) 5-325 MG tablet Take 1-2  tablets by mouth every 4 (four) hours as needed for moderate pain.   mirabegron ER (MYRBETRIQ) 25 MG TB24 tablet Take 25 mg by mouth at bedtime.   montelukast (SINGULAIR) 10 MG tablet Take 10 mg by mouth daily.   montelukast (SINGULAIR) 10 MG tablet Take 1 tablet by mouth daily.   propranolol (INDERAL) 60 MG tablet Take 60 mg by mouth daily.    Past Medical History:  Diagnosis Date   Actinic keratosis 08/06/2015   L med lower leg (bx proven)   Allergy    Anxiety    Asthma    Basal cell carcinoma 08/28/2015   R ant to sideburn zygoma    Bursitis    BOTH SHOULDER AND HIPS   COPD (chronic obstructive pulmonary disease) ([place])    COVID-19 05/2019   Dysplastic nevus 02/28/2019   R upper back paraspinal - mod   Dysplastic nevus 03/17/2020   L lat mid lat tricep - mod   Fibromyalgia    GERD (gastroesophageal reflux disease)    Hiatal hernia    History of kidney stones    IBS (irritable bowel syndrome)    Mild obstructive sleep apnea    does not use Cpap   Morbid obesity (HCC)    Pneumonia    Spinal stenosis    Varicose veins     Past Surgical History:  Procedure Laterality Date   [place] BIOPSY Left 2000   neg   CHOLECYSTECTOMY  COLONOSCOPY  2008   COLONOSCOPY WITH PROPOFOL N/A 02/18/2016   Procedure: COLONOSCOPY WITH PROPOFOL;  Surgeon: [name], MD;  Location: [facility] ENDOSCOPY;  Service: Endoscopy;  Laterality: N/A;   CYSTOSCOPY W/ RETROGRADES Left 05/24/2019   Procedure: CYSTOSCOPY WITH RETROGRADE PYELOGRAM;  Surgeon: Royston Cowper, MD;  Location: [facility] ORS;  Service: Urology;  Laterality: Left;   dental work     EXTRACORPOREAL SHOCK WAVE LITHOTRIPSY Right 06/07/2019   Procedure: EXTRACORPOREAL SHOCK WAVE LITHOTRIPSY (ESWL);  Surgeon: Royston Cowper, MD;  Location: [facility] ORS;  Service: Urology;  Laterality: Right;   HARDWARE REMOVAL Left 01/19/2021   Procedure: HARDWARE REMOVAL;  Surgeon:  Dereck Leep, MD;  Location: [facility] ORS;  Service: Orthopedics;  Laterality: Left;   ORIF PATELLA Left 06/02/2016   Procedure: OPEN REDUCTION INTERNAL (ORIF) FIXATION PATELLA;  Surgeon: Dereck Leep, MD;  Location: [facility] ORS;  Service: Orthopedics;  Laterality: Left;   PARTIAL HYSTERECTOMY     PATELLAR TENDON REPAIR Left 06/02/2016   Procedure: PATELLA TENDON REPAIR;  Surgeon: Dereck Leep, MD;  Location: [facility] ORS;  Service: Orthopedics;  Laterality: Left;   TUBAL LIGATION     UPPER GI ENDOSCOPY  2008   URETEROSCOPY WITH HOLMIUM LASER LITHOTRIPSY Left 05/24/2019   Procedure: URETEROSCOPY WITH HOLMIUM LASER LITHOTRIPSY;  Surgeon: Royston Cowper, MD;  Location: [facility] ORS;  Service: Urology;  Laterality: Left;   vein closure procedure Bilateral 2010    Social History Social History   Tobacco Use   Smoking status: Never   Smokeless tobacco: Never  Vaping Use   Vaping Use: Never used  Substance Use Topics   Alcohol use: Not Currently   Drug use: No    Family History Family History  Problem Relation Age of Onset   Colon cancer Father 34   Thrombocytopenia Mother 72   Other Sister        Bone Marrow disorder   Breast cancer Neg Hx     Allergies  Allergen Reactions   Tape Other (See Comments)    Tears skin   Molds & Smuts Other (See Comments)    allergic   Cat Hair Extract Itching    sneezing   Diovan [Valsartan] Rash    Edema   Sulfa Antibiotics Itching and Rash   Sulfa Drugs Cross Reactors Itching and Rash     REVIEW OF SYSTEMS (Negative unless checked)  Constitutional: [] Weight loss  [] Fever  [] Chills Cardiac: [] Chest pain   [] Chest pressure   [] Palpitations   [] Shortness of breath when laying flat   [] Shortness of breath with exertion. Vascular:  [] Pain in legs with walking   [x] Pain in legs at rest  [] History of DVT   [] Phlebitis   [x] Swelling in legs   [x] Varicose veins   [] Non-healing ulcers Pulmonary:   [] Uses home oxygen   [] Productive cough   [] Hemoptysis    [] Wheeze  [] COPD   [] Asthma Neurologic:  [] Dizziness   [] Seizures   [] History of stroke   [] History of TIA  [] Aphasia   [] Vissual changes   [] Weakness or numbness in arm   [] Weakness or numbness in leg Musculoskeletal:   [] Joint swelling   [] Joint pain   [] Low back pain Hematologic:  [] Easy bruising  [] Easy bleeding   [] Hypercoagulable state   [] Anemic Gastrointestinal:  [] Diarrhea   [] Vomiting  [] Gastroesophageal reflux/heartburn   [] Difficulty swallowing. Genitourinary:  [] Chronic kidney disease   [] Difficult urination  [] Frequent urination   [] Blood in urine Skin:  [] Rashes   []   Ulcers  Psychological:  [] History of anxiety   []  History of major depression.  Physical Examination  Vitals:   03/16/21 1334  BP: 115/73  Pulse: 78  Weight: 165 lb (74.8 kg)  Height: 5\' 3"  (1.6 m)   Body mass index is 29.23 kg/m. Gen: WD/WN, NAD Head: [place]/AT, No temporalis wasting.  Ear/Nose/Throat: Hearing grossly intact, nares w/o erythema or drainage Eyes: PER, EOMI, sclera nonicteric.  Neck: Supple, no large masses.   Pulmonary:  Good air movement, no audible wheezing bilaterally, no use of accessory muscles.  Cardiac: RRR, no JVD Vascular:  Large varicosities present extensively greater than 8-10 mm bilaterally.  Moderate venous stasis changes to the legs bilaterally.  2+ soft pitting edema  Vessel Right Left  Radial Palpable Palpable  PT Palpable Palpable  DP Palpable Palpable  Gastrointestinal: Non-distended. No guarding/no peritoneal signs.  Musculoskeletal: M/S 5/5 throughout.  No deformity or atrophy.  Neurologic: CN 2-12 intact. Symmetrical.  Speech is fluent. Motor exam as listed above. Psychiatric: Judgment intact, Mood & affect appropriate for pt's clinical situation. Dermatologic: Moderate venous rashes no ulcers noted.  No changes consistent with cellulitis. Lymph : No lichenification or skin changes of chronic lymphedema.  CBC Lab Results  Component Value Date   WBC 11.7 (H)  11/17/2020   HGB 14.1 11/17/2020   HCT 42.4 11/17/2020   MCV 89.6 11/17/2020   PLT 266 11/17/2020    BMET    Component Value Date/Time   NA 139 11/17/2020 2039   NA 139 04/26/2013 1716   K 4.2 11/17/2020 2039   K 3.4 (L) 04/26/2013 1716   CL 104 11/17/2020 2039   CL 104 04/26/2013 1716   CO2 29 11/17/2020 2039   CO2 30 04/26/2013 1716   GLUCOSE 100 (H) 11/17/2020 2039   GLUCOSE 97 04/26/2013 1716   BUN 18 11/17/2020 2039   BUN 18 04/26/2013 1716   CREATININE 0.79 11/17/2020 2039   CREATININE 0.85 04/26/2013 1716   CALCIUM 8.8 (L) 11/17/2020 2039   CALCIUM 8.8 04/26/2013 1716   GFRNONAA >60 11/17/2020 2039   GFRNONAA >60 04/26/2013 1716   GFRAA >60 05/10/2019 2019   GFRAA >60 04/26/2013 1716   CrCl cannot be calculated (Patient's most recent lab result is older than the maximum 21 days allowed.).  COAG No results found for: INR, PROTIME  Radiology No results found.   Assessment/Plan 1. Varicose veins with pain  Recommend:  The patient has large symptomatic varicose veins that are painful and associated with swelling.  I have had a long discussion with the patient regarding  varicose veins and why they cause symptoms.  Patient will begin wearing graduated compression stockings class 1 on a daily basis, beginning first thing in the morning and removing them in the evening. The patient is instructed specifically not to sleep in the stockings.    The patient  will also begin using over-the-counter analgesics such as Motrin 600 mg po TID to help control the symptoms.    In addition, behavioral modification including elevation during the day will be initiated.    Pending the results of these changes the  patient will be reevaluated in three months.   An  ultrasound of the venous system will be obtained.   Further plans will be based on the ultrasound results and whether conservative therapies are successful at eliminating the pain and swelling.   - VAS [place] LOWER  EXTREMITY VENOUS REFLUX; Future  2. Chronic venous insufficiency  Recommend:  The patient  has large symptomatic varicose veins that are painful and associated with swelling.  I have had a long discussion with the patient regarding  varicose veins and why they cause symptoms.  Patient will begin wearing graduated compression stockings class 1 on a daily basis, beginning first thing in the morning and removing them in the evening. The patient is instructed specifically not to sleep in the stockings.    The patient  will also begin using over-the-counter analgesics such as Motrin 600 mg po TID to help control the symptoms.    In addition, behavioral modification including elevation during the day will be initiated.    Pending the results of these changes the  patient will be reevaluated in three months.   An  ultrasound of the venous system will be obtained.   Further plans will be based on the ultrasound results and whether conservative therapies are successful at eliminating the pain and swelling.   - VAS [place] LOWER EXTREMITY VENOUS REFLUX; Future  3. Chronic obstructive pulmonary disease, unspecified COPD type ([place]) Continue pulmonary medications and aerosols as already ordered, these medications have been reviewed and there are no changes at this time.    4. Hyperlipidemia, mixed Continue statin as ordered and reviewed, no changes at this time    Hortencia Pilar, MD  03/17/2021 4:01 PM

## 2021-03-18 ENCOUNTER — Ambulatory Visit
Admission: RE | Admit: 2021-03-18 | Discharge: 2021-03-18 | Disposition: A | Discharge status: Home / Self Care | Payer: Medicare PPO | Source: Ambulatory Visit | Attending: Internal Medicine | Admitting: Internal Medicine

## 2021-03-18 ENCOUNTER — Other Ambulatory Visit: Payer: Self-pay

## 2021-03-18 DIAGNOSIS — Z1231 Encounter for screening mammogram for malignant neoplasm of breast: Secondary | ICD-10-CM

## 2021-03-25 ENCOUNTER — Ambulatory Visit (INDEPENDENT_AMBULATORY_CARE_PROVIDER_SITE_OTHER): Payer: Medicare PPO

## 2021-03-25 ENCOUNTER — Ambulatory Visit (INDEPENDENT_AMBULATORY_CARE_PROVIDER_SITE_OTHER): Payer: Medicare PPO | Admitting: Nurse Practitioner

## 2021-03-25 ENCOUNTER — Ambulatory Visit (INDEPENDENT_AMBULATORY_CARE_PROVIDER_SITE_OTHER): Payer: Medicare PPO | Admitting: Dermatology

## 2021-03-25 ENCOUNTER — Other Ambulatory Visit: Payer: Self-pay

## 2021-03-25 VITALS — BP 116/76 | HR 80 | Ht 63.0 in | Wt 168.0 lb

## 2021-03-25 DIAGNOSIS — I83813 Varicose veins of bilateral lower extremities with pain: Secondary | ICD-10-CM | POA: Diagnosis not present

## 2021-03-25 DIAGNOSIS — I83819 Varicose veins of unspecified lower extremities with pain: Secondary | ICD-10-CM | POA: Diagnosis not present

## 2021-03-25 DIAGNOSIS — L719 Rosacea, unspecified: Secondary | ICD-10-CM

## 2021-03-25 DIAGNOSIS — L700 Acne vulgaris: Secondary | ICD-10-CM

## 2021-03-25 DIAGNOSIS — I872 Venous insufficiency (chronic) (peripheral): Secondary | ICD-10-CM | POA: Diagnosis not present

## 2021-03-25 DIAGNOSIS — Z1283 Encounter for screening for malignant neoplasm of skin: Secondary | ICD-10-CM | POA: Diagnosis not present

## 2021-03-25 DIAGNOSIS — L814 Other melanin hyperpigmentation: Secondary | ICD-10-CM

## 2021-03-25 DIAGNOSIS — D692 Other nonthrombocytopenic purpura: Secondary | ICD-10-CM

## 2021-03-25 DIAGNOSIS — D1800 Hemangioma unspecified site: Secondary | ICD-10-CM

## 2021-03-25 DIAGNOSIS — Z85828 Personal history of other malignant neoplasm of skin: Secondary | ICD-10-CM | POA: Diagnosis not present

## 2021-03-25 DIAGNOSIS — L578 Other skin changes due to chronic exposure to nonionizing radiation: Secondary | ICD-10-CM

## 2021-03-25 DIAGNOSIS — L821 Other seborrheic keratosis: Secondary | ICD-10-CM

## 2021-03-25 DIAGNOSIS — D229 Melanocytic nevi, unspecified: Secondary | ICD-10-CM

## 2021-03-25 DIAGNOSIS — L853 Xerosis cutis: Secondary | ICD-10-CM

## 2021-03-25 DIAGNOSIS — Z86018 Personal history of other benign neoplasm: Secondary | ICD-10-CM

## 2021-03-25 NOTE — Patient Instructions (Signed)

## 2021-03-25 NOTE — Progress Notes (Signed)
Follow-Up Visit   Subjective  Darlene Hubbard is a 70 y.o. female who presents for the following: Annual Exam (Hx BCC, dysplastic nevi - patient has noticed new skin lesions on the L sideburn and and R chest area that have been there for a few months and will not resolve.).  The following portions of the chart were reviewed this encounter and updated as appropriate:   Tobacco  Allergies  Meds  Problems  Med Hx  Surg Hx  Fam Hx     Review of Systems:  No other skin or systemic complaints except as noted in HPI or Assessment and Plan.  Objective  Well appearing patient in no apparent distress; mood and affect are within normal limits.  A full examination was performed including scalp, head, eyes, ears, nose, lips, neck, chest, axillae, abdomen, back, buttocks, bilateral upper extremities, bilateral lower extremities, hands, feet, fingers, toes, fingernails, and toenails. All findings within normal limits unless otherwise noted below.  Face Confluent deep cysts and closed comedones on the cheeks.   Face Erythema and telangiectasias of the face.    Assessment & Plan  Acne vulgaris Face  Discussed 8 mths of Isotretinoin as best treatment option. Reviewed potential side effects of isotretinoin including xerosis, cheilitis, hepatitis, hyperlipidemia, and severe birth defects if taken by a pregnant woman. Reviewed reports of suicidal ideation in those with a history of depression while taking isotretinoin and reports of diagnosis of inflammatory bowl disease while taking isotretinoin as well as the lack of evidence for a causal relationship between isotretinoin, depression and IBD. Patient advised to reach out with any questions or concerns. Patient advised not to share pills or donate blood while on treatment or for one month after completing treatment.   Pending lab results start Isotretinoin '20mg'$  po QD.   Lipid Panel - Face  Luteinizing Hormone - Face  Follicle Stimulating Hormone  - Face  CMP - Face  Rosacea Face  Rosacea is a chronic progressive skin condition usually affecting the face of adults, causing redness and/or acne bumps. It is treatable but not curable. It sometimes affects the eyes (ocular rosacea) as well. It may respond to topical and/or systemic medication and can flare with stress, sun exposure, alcohol, exercise and some foods.  Daily application of broad spectrum spf 30+ sunscreen to face is recommended to reduce flares. Treatment as above with isotretinoin should help.  Laser treatment with BBL laser should help with the redness and telangiectasias of the face.  Skin cancer screening  Purpura - Chronic; persistent and recurrent.  Treatable, but not curable. - Violaceous macules and patches - Benign - Related to trauma, age, sun damage and/or use of blood thinners, chronic use of topical and/or oral steroids - Observe - Can use OTC arnica containing moisturizer such as Dermend Bruise Formula if desired - Call for worsening or other concerns  Lentigines - Scattered tan macules - Due to sun exposure - Benign-appering, observe - Recommend daily broad spectrum sunscreen SPF 30+ to sun-exposed areas, reapply every 2 hours as needed. - Call for any changes  Seborrheic Keratoses - Stuck-on, waxy, tan-brown papules and/or plaques  - Benign-appearing - Discussed benign etiology and prognosis. - Observe - Call for any changes  Melanocytic Nevi - Tan-brown and/or pink-flesh-colored symmetric macules and papules - Benign appearing on exam today - Observation - Call clinic for new or changing moles - Recommend daily use of broad spectrum spf 30+ sunscreen to sun-exposed areas.   Hemangiomas - Red papules -  Discussed benign nature - Observe - Call for any changes  Actinic Damage - Chronic condition, secondary to cumulative UV/sun exposure - diffuse scaly erythematous macules with underlying dyspigmentation - Recommend daily broad spectrum  sunscreen SPF 30+ to sun-exposed areas, reapply every 2 hours as needed.  - Staying in the shade or wearing long sleeves, sun glasses (UVA+UVB protection) and wide brim hats (4-inch brim around the entire circumference of the hat) are also recommended for sun protection.  - Call for new or changing lesions.  History of Basal Cell Carcinoma of the Skin - No evidence of recurrence today - Recommend regular full body skin exams - Recommend daily broad spectrum sunscreen SPF 30+ to sun-exposed areas, reapply every 2 hours as needed.  - Call if any new or changing lesions are noted between office visits  History of Dysplastic Nevi - No evidence of recurrence today - Recommend regular full body skin exams - Recommend daily broad spectrum sunscreen SPF 30+ to sun-exposed areas, reapply every 2 hours as needed.  - Call if any new or changing lesions are noted between office visits  Xerosis - diffuse xerotic patches - recommend gentle, hydrating skin care like  CeraVe cream moisturizer - gentle skin care handout given  Skin cancer screening performed today.  Return in about 1 month (around 04/25/2021) for Isotretinoin follow up . Luther Redo, CMA, am acting as scribe for Sarina Ser, MD . Documentation: I have reviewed the above documentation for accuracy and completeness, and I agree with the above.  Sarina Ser, MD

## 2021-03-26 ENCOUNTER — Telehealth: Payer: Self-pay

## 2021-03-26 ENCOUNTER — Encounter: Payer: Self-pay | Admitting: Dermatology

## 2021-03-26 LAB — COMPREHENSIVE METABOLIC PANEL
ALT: 15 IU/L (ref 0–32)
AST: 15 IU/L (ref 0–40)
Albumin/Globulin Ratio: 2 (ref 1.2–2.2)
Albumin: 3.9 g/dL (ref 3.7–4.7)
Alkaline Phosphatase: 68 IU/L (ref 44–121)
BUN/Creatinine Ratio: 23 (ref 12–28)
BUN: 14 mg/dL (ref 8–27)
Bilirubin Total: 0.6 mg/dL (ref 0.0–1.2)
CO2: 27 mmol/L (ref 20–29)
Calcium: 9.2 mg/dL (ref 8.7–10.3)
Chloride: 104 mmol/L (ref 96–106)
Creatinine, Ser: 0.62 mg/dL (ref 0.57–1.00)
Globulin, Total: 2 g/dL (ref 1.5–4.5)
Glucose: 92 mg/dL (ref 65–99)
Potassium: 4.3 mmol/L (ref 3.5–5.2)
Sodium: 142 mmol/L (ref 134–144)
Total Protein: 5.9 g/dL — ABNORMAL LOW (ref 6.0–8.5)
eGFR: 95 mL/min/{1.73_m2} (ref >59)

## 2021-03-26 LAB — LIPID PANEL
Chol/HDL Ratio: 3.1 ratio (ref 0.0–4.4)
Cholesterol, Total: 210 mg/dL — ABNORMAL HIGH (ref 100–199)
HDL: 67 mg/dL (ref >39)
LDL Chol Calc (NIH): 133 mg/dL — ABNORMAL HIGH (ref 0–99)
Triglycerides: 56 mg/dL (ref 0–149)
VLDL Cholesterol Cal: 10 mg/dL (ref 5–40)

## 2021-03-26 LAB — FOLLICLE STIMULATING HORMONE: FSH: 55.9 m[IU]/mL

## 2021-03-26 LAB — LUTEINIZING HORMONE: LH: 24 m[IU]/mL

## 2021-03-26 MED ORDER — ISOTRETINOIN 20 MG PO CAPS: 20.0000 mg | ORAL_CAPSULE | Freq: Every day | ORAL | 0 refills | Status: AC

## 2021-03-26 NOTE — Telephone Encounter (Signed)
-----   Message from Ralene Bathe, MD sent at 03/26/2021 12:57 PM EDT ----- Lab from 03/25/21 shows lab is OK. Lab also shows pt is PostMenopausal. May start Isotretinoin 20 mg 1 po qd with food #30 0rf Call pt and send medication to pharmacy. Keep 1 month fu appt

## 2021-03-26 NOTE — Telephone Encounter (Signed)
Advised pt of labs.  Registered pt in [place] were signed at office visit.  IPLEDGE # [id number].  Advised pt Isotretinoin '20mg'$  1 po qd with fatty meal #30 0rf sent to [facility]./sh

## 2021-04-07 ENCOUNTER — Encounter (INDEPENDENT_AMBULATORY_CARE_PROVIDER_SITE_OTHER): Payer: Self-pay | Admitting: Nurse Practitioner

## 2021-04-07 NOTE — Progress Notes (Signed)
Subjective:    Patient ID: Darlene Hubbard, female    DOB: Oct 06, 1950, 70 y.o.   MRN: [id number] Chief Complaint  Patient presents with  . Follow-up    Pt conv. [place]    And Darlene Hubbard is a 70 year old female that presents today for follow-up evaluation of her varicose veins.  Patient has had some swelling of her lower extremities.  She has previously had a DVT in the past.  The patient has also had numerous endovenous laser ablations on both great saphenous veins as well as a left accessory saphenous veins.  She continues to have varicosities.  She continues to use conservative therapy.  The patient is concerned due to her previous numerous issues she wanted to follow-up to ensure there were no worsening issues.  Today there is no evidence of DVT or superficial thrombophlebitis seen bilaterally.  No evidence of deep venous insufficiency or superficial venous reflux seen bilaterally.  The bilateral great saphenous veins remain closed as well as the left accessory saphenous vein.  Review of Systems  Cardiovascular:  Positive for leg swelling.  All other systems reviewed and are negative.     Objective:   Physical Exam Vitals reviewed.  HENT:     Head: Normocephalic.  Cardiovascular:     Rate and Rhythm: Normal rate.     Pulses: Normal pulses.  Pulmonary:     Effort: Pulmonary effort is normal.  Skin:    General: Skin is warm and dry.     Comments: Varicosities bilaterally  Neurological:     Mental Status: She is alert and oriented to person, place, and time.  Psychiatric:        Mood and Affect: Mood normal.        Behavior: Behavior normal.        Thought Content: Thought content normal.        Judgment: Judgment normal.    BP 116/76   Pulse 80   Ht '5\' 3"'$  (1.6 m)   Wt 168 lb (76.2 kg)   BMI 29.76 kg/m   Past Medical History:  Diagnosis Date  . Actinic keratosis 08/06/2015   L med lower leg (bx proven)  . Allergy   . Anxiety   . Asthma   . Basal cell carcinoma 08/28/2015    R ant to sideburn zygoma   . Bursitis    BOTH SHOULDER AND HIPS  . COPD (chronic obstructive pulmonary disease) ([place])   . COVID-19 05/2019  . 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 Dysplastic nevus 03/17/2020   L lat mid lat tricep - mod  . Fibromyalgia   . GERD (gastroesophageal reflux disease)   . Hiatal hernia   . History of kidney stones   . IBS (irritable bowel syndrome)   . Mild obstructive sleep apnea    does not use Cpap  . Morbid obesity ([place])   . Pneumonia   . Spinal stenosis   . Varicose veins     Social History   Socioeconomic History  . Marital status: Widowed    Spouse name: Not on file  . Number of children: Not on file  . Years of education: Not on file  . Highest education level: Not on file  Occupational History  . Not on file  Tobacco Use  . Smoking status: Never  . Smokeless tobacco: Never  Vaping Use  . Vaping Use: Never used  Substance and Sexual Activity  . Alcohol use: Not  Currently  . Drug use: No  . Sexual activity: Not on file  Other Topics Concern  . Not on file  Social History Narrative   Son lives with patient   Social Determinants of Health   Financial Resource Strain: Not on file  Food Insecurity: Not on file  Transportation Needs: Not on file  Physical Activity: Not on file  Stress: Not on file  Social Connections: Not on file  Intimate Partner Violence: Not on file    Past Surgical History:  Procedure Laterality Date  . ABDOMINAL HYSTERECTOMY  1993  . APPENDECTOMY  1995  . BACK SURGERY    . BREAST BIOPSY Left 2000   neg  . CHOLECYSTECTOMY    . COLONOSCOPY  2008  . COLONOSCOPY WITH PROPOFOL N/A 02/18/2016   Procedure: COLONOSCOPY WITH PROPOFOL;  Surgeon: [name], MD;  Location: [facility] ENDOSCOPY;  Service: Endoscopy;  Laterality: N/A;  . CYSTOSCOPY W/ RETROGRADES Left 05/24/2019   Procedure: CYSTOSCOPY WITH RETROGRADE PYELOGRAM;  Surgeon: Royston Cowper, MD;  Location: [facility] ORS;   Service: Urology;  Laterality: Left;  . dental work    . EXTRACORPOREAL SHOCK WAVE LITHOTRIPSY Right 06/07/2019   Procedure: EXTRACORPOREAL SHOCK WAVE LITHOTRIPSY (ESWL);  Surgeon: Royston Cowper, MD;  Location: [facility] ORS;  Service: Urology;  Laterality: Right;  . HARDWARE REMOVAL Left 01/19/2021   Procedure: HARDWARE REMOVAL;  Surgeon: Dereck Leep, MD;  Location: [facility] ORS;  Service: Orthopedics;  Laterality: Left;  . ORIF PATELLA Left 06/02/2016   Procedure: OPEN REDUCTION INTERNAL (ORIF) FIXATION PATELLA;  Surgeon: Dereck Leep, MD;  Location: [facility] ORS;  Service: Orthopedics;  Laterality: Left;  . PARTIAL HYSTERECTOMY    . PATELLAR TENDON REPAIR Left 06/02/2016   Procedure: PATELLA TENDON REPAIR;  Surgeon: Dereck Leep, MD;  Location: [facility] ORS;  Service: Orthopedics;  Laterality: Left;  . TUBAL LIGATION    . UPPER GI ENDOSCOPY  2008  . URETEROSCOPY WITH HOLMIUM LASER LITHOTRIPSY Left 05/24/2019   Procedure: URETEROSCOPY WITH HOLMIUM LASER LITHOTRIPSY;  Surgeon: Royston Cowper, MD;  Location: [facility] ORS;  Service: Urology;  Laterality: Left;  . vein closure procedure Bilateral 2010    Family History  Problem Relation Age of Onset  . Colon cancer Father 58  . Thrombocytopenia Mother 80  . Other Sister        Bone Marrow disorder  . Breast cancer Neg Hx     Allergies  Allergen Reactions  . Tape Other (See Comments)    Tears skin  . Doxycycline Nausea And Vomiting  . Molds & Smuts Other (See Comments)    allergic  . Cat Hair Extract Itching    sneezing  . Diovan [Valsartan] Rash    Edema  . Sulfa Antibiotics Itching and Rash  . Sulfa Drugs Cross Reactors Itching and Rash    CBC Latest Ref Rng & Units 11/17/2020 05/10/2019 06/26/2016  WBC 4.0 - 10.5 K/uL 11.7(H) 12.5(H) 14.9(H)  Hemoglobin 12.0 - 15.0 g/dL 14.1 13.7 14.0  Hematocrit 36.0 - 46.0 % 42.4 42.1 41.1  Platelets 150 - 400 K/uL 266 253 255      CMP     Component Value Date/Time   NA 142 03/25/2021 1158    NA 139 04/26/2013 1716   K 4.3 03/25/2021 1158   K 3.4 (L) 04/26/2013 1716   CL 104 03/25/2021 1158   CL 104 04/26/2013 1716   CO2 27 03/25/2021 1158   CO2 30 04/26/2013 1716   GLUCOSE  92 03/25/2021 1158   GLUCOSE 100 (H) 11/17/2020 2039   GLUCOSE 97 04/26/2013 1716   BUN 14 03/25/2021 1158   BUN 18 04/26/2013 1716   CREATININE 0.62 03/25/2021 1158   CREATININE 0.85 04/26/2013 1716   CALCIUM 9.2 03/25/2021 1158   CALCIUM 8.8 04/26/2013 1716   PROT 5.9 (L) 03/25/2021 1158   PROT 6.3 (L) 04/26/2013 1716   ALBUMIN 3.9 03/25/2021 1158   ALBUMIN 3.1 (L) 04/26/2013 1716   AST 15 03/25/2021 1158   AST 19 04/26/2013 1716   ALT 15 03/25/2021 1158   ALT 23 04/26/2013 1716   ALKPHOS 68 03/25/2021 1158   ALKPHOS 90 04/26/2013 1716   BILITOT 0.6 03/25/2021 1158   BILITOT 0.5 04/26/2013 1716   GFRNONAA >60 11/17/2020 2039   GFRNONAA >60 04/26/2013 1716   GFRAA >60 05/10/2019 2019   GFRAA >60 04/26/2013 1716     No results found.     Assessment & Plan:   1. Varicose veins with pain Currently no evidence of worsening venous reflux or DVT seen based on the noninvasive studies.  The patient has had to have numerous treatments to her varicose veins over the years and would like to keep an eye to ensure that there does not have any worsening issues in the future.  Patient is advised to continue with conservative therapy including use of compression socks, elevation and activity    Current Outpatient Medications on File Prior to Visit  Medication Sig Dispense Refill  . acetaminophen (TYLENOL) 500 MG tablet Take by mouth.    Marland Kitchen albuterol (VENTOLIN HFA) 108 (90 Base) MCG/ACT inhaler Inhale into the lungs.    . ALPRAZolam (XANAX) 0.5 MG tablet Take 0.5 mg by mouth at bedtime.     Marland Kitchen EPINEPHrine 0.3 mg/0.3 mL IJ SOAJ injection Inject 0.3 mg into the muscle as needed for anaphylaxis.    Marland Kitchen escitalopram (LEXAPRO) 10 MG tablet Take 10 mg by mouth daily.    . fluticasone-salmeterol (ADVAIR)  250-50 MCG/ACT AEPB Inhale 1 puff into the lungs 2 (two) times daily as needed (respiratory issues.).     Marland Kitchen HYDROcodone-acetaminophen (NORCO) 5-325 MG tablet Take 1-2 tablets by mouth every 4 (four) hours as needed for moderate pain. 6 tablet 0  . mirabegron ER (MYRBETRIQ) 25 MG TB24 tablet Take 25 mg by mouth at bedtime.    . montelukast (SINGULAIR) 10 MG tablet Take 10 mg by mouth daily.    . montelukast (SINGULAIR) 10 MG tablet Take 1 tablet by mouth daily.    . propranolol (INDERAL) 60 MG tablet Take 60 mg by mouth daily.     No current facility-administered medications on file prior to visit.    There are no Patient Instructions on file for this visit. No follow-ups on file.   Kris Hartmann, NP

## 2021-04-29 ENCOUNTER — Ambulatory Visit: Payer: Medicare PPO | Admitting: Dermatology

## 2021-04-29 ENCOUNTER — Other Ambulatory Visit: Payer: Self-pay

## 2021-04-29 DIAGNOSIS — L84 Corns and callosities: Secondary | ICD-10-CM | POA: Diagnosis not present

## 2021-04-29 DIAGNOSIS — K130 Diseases of lips: Secondary | ICD-10-CM

## 2021-04-29 DIAGNOSIS — L700 Acne vulgaris: Secondary | ICD-10-CM

## 2021-04-29 DIAGNOSIS — Z79899 Other long term (current) drug therapy: Secondary | ICD-10-CM

## 2021-04-29 DIAGNOSIS — L853 Xerosis cutis: Secondary | ICD-10-CM

## 2021-04-29 MED ORDER — ISOTRETINOIN 20 MG PO CAPS: 20.0000 mg | ORAL_CAPSULE | Freq: Every day | ORAL | 0 refills | Status: AC

## 2021-04-29 NOTE — Patient Instructions (Signed)
Reviewed potential side effects of isotretinoin including xerosis, cheilitis, hepatitis, hyperlipidemia, and severe birth defects if taken by a pregnant woman. Reviewed reports of suicidal ideation in those with a history of depression while taking isotretinoin and reports of diagnosis of inflammatory bowl disease while taking isotretinoin as well as the lack of evidence for a causal relationship between isotretinoin, depression and IBD. Patient advised to reach out with any questions or concerns. Patient advised not to share pills or donate blood while on treatment or for one month after completing treatment.     If you have any questions or concerns for your doctor, please call our main line at [phone] and press option 4 to reach your doctor's medical assistant. If no one answers, please leave a voicemail as directed and we will return your call as soon as possible. Messages left after 4 pm will be answered the following business day.   You may also send [place] a message via [place]. We typically respond to MyChart messages within 1-2 business days.  For prescription refills, please ask your pharmacy to contact our office. Our fax number is [phone].  If you have an urgent issue when the clinic is closed that cannot wait until the next business day, you can page your doctor at the number below.    Please note that while we do our best to be available for urgent issues outside of office hours, we are not available 24/7.   If you have an urgent issue and are unable to reach [place], you may choose to seek medical care at your doctor's office, retail clinic, urgent care center, or emergency room.  If you have a medical emergency, please immediately call 911 or go to the emergency department.  Pager Numbers  - Dr. Nehemiah Massed: [phone]  - Dr. Laurence Ferrari: [phone]  - Dr. Nicole Kindred: [phone]  In the event of inclement weather, please call our main line at [phone] for an update on the  status of any delays or closures.  Dermatology Medication Tips: Please keep the boxes that topical medications come in in order to help keep track of the instructions about where and how to use these. Pharmacies typically print the medication instructions only on the boxes and not directly on the medication tubes.   If your medication is too expensive, please contact our office at [phone] option 4 or send [place] a message through [place].   We are unable to tell what your co-pay for medications will be in advance as this is different depending on your insurance coverage. However, we may be able to find a substitute medication at lower cost or fill out paperwork to get insurance to cover a needed medication.   If a prior authorization is required to get your medication covered by your insurance company, please allow [place] 1-2 business days to complete this process.  Drug prices often vary depending on where the prescription is filled and some pharmacies may offer cheaper prices.  The website www.goodrx.com contains coupons for medications through different pharmacies. The prices here do not account for what the cost may be with help from insurance (it may be cheaper with your insurance), but the website can give you the price if you did not use any insurance.  - You can print the associated coupon and take it with your prescription to the pharmacy.  - You may also stop by our office during regular business hours and pick up a GoodRx coupon card.  - If you need your prescription  sent electronically to a different pharmacy, notify our office through [facility] or by phone at [phone] option 4.

## 2021-04-29 NOTE — Progress Notes (Signed)
   Isotretinoin Follow-Up Visit   Subjective  Darlene Hubbard is a 70 y.o. female who presents for the following: Acne (Isotretinoin f/u ). Pt taking Isotretinoin 20 mg tablets daily, pt has only taken 10 days of Isotretinoin  Pt report she has been tired lately and dry skin.   Week # 4 weeks  Side effects: Dry skin, dry lips  Denies changes in night vision, shortness of breath, abdominal pain, nausea, vomiting, diarrhea, blood in stool or urine, visual changes, headaches, epistaxis, joint pain, myalgias, mood changes, depression, or suicidal ideation.   Patient is not pregnant, not seeking pregnancy, and not breastfeeding.  The following portions of the chart were reviewed this encounter and updated as appropriate: medications, allergies, medical history  Review of Systems:  No other skin or systemic complaints except as noted in HPI or Assessment and Plan.  Objective  Well appearing patient in no apparent distress; mood and affect are within normal limits.  An examination of the face, neck, chest, and back was performed and relevant findings are noted below.   face Confluent deep cysts and closed comedones on the cheeks  right 4th and 5th toe Callus skin    Assessment & Plan   Acne vulgaris face  Severe; On Isotretinoin -  requiring FDA mandated monthly evaluations and laboratory monitoring; Chronic and Persistent; Not to Goal   Cont Isotretinoin 20 mg take 1 tablet daily, if any side effects decrease dosage to 1 tablet every other day   Samples of Dr Luvenia Heller Cortibalm given  [facility]   Related Medications ISOtretinoin (ACCUTANE) 20 MG capsule Take 1 capsule (20 mg total) by mouth daily.  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  Xerosis secondary to isotretinoin therapy - Continue emollients as directed  Cheilitis secondary to  isotretinoin therapy - Continue lip balm as directed, Dr. Luvenia Heller Cortibalm recommended  Long term medication management (isotretinoin) - While taking Isotretinoin and for 30 days after you finish the medication, do not get pregnant, do not share pills, do not donate blood. Isotretinoin is best absorbed when taken with a fatty meal. Isotretinoin can make you sensitive to the sun. Daily careful sun protection including sunscreen SPF 30+ when outdoors is recommended.  Follow-up in 6-7 weeks   I, Darlene Hubbard, CMA, am acting as scribe for Sarina Ser, MD .  Documentation: I have reviewed the above documentation for accuracy and completeness, and I agree with the above.  Sarina Ser, MD

## 2021-04-30 ENCOUNTER — Encounter: Payer: Self-pay | Admitting: Dermatology

## 2021-05-19 ENCOUNTER — Telehealth: Payer: Self-pay

## 2021-05-19 NOTE — Telephone Encounter (Signed)
Patient advised of information per Dr. Kowalski.  

## 2021-05-19 NOTE — Telephone Encounter (Signed)
Patient left voicemail that she is unable to continue isotretinoin 20mg  QOD. She is just too dry. Her ears are peeling inside and out, her throat is very dry and just unable to tolerate the side effects.

## 2021-06-02 ENCOUNTER — Telehealth: Payer: Self-pay

## 2021-06-02 NOTE — Telephone Encounter (Signed)
Left detailed voicemail regarding information per Dr. Nehemiah Massed.

## 2021-06-02 NOTE — Telephone Encounter (Signed)
Patient called stating she currently has a fungal infection with some skin rubbing together, along with some side effects from medications. She is asking if we can send in any topical treatment to help with this area?  Pharmacy: [facility].

## 2021-06-04 NOTE — Telephone Encounter (Signed)
Patient continues to call and leave a message that she is waiting to hear back from [place]. 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 Called patient again and left second detailed VM with instructions per Dr. Nehemiah Massed.

## 2021-06-17 ENCOUNTER — Other Ambulatory Visit: Payer: Self-pay

## 2021-06-17 ENCOUNTER — Ambulatory Visit: Payer: Medicare PPO | Admitting: Dermatology

## 2021-06-17 DIAGNOSIS — L719 Rosacea, unspecified: Secondary | ICD-10-CM

## 2021-06-17 DIAGNOSIS — L700 Acne vulgaris: Secondary | ICD-10-CM

## 2021-06-17 MED ORDER — ISOTRETINOIN 10 MG PO CAPS: ORAL_CAPSULE | ORAL | 0 refills | Status: DC

## 2021-06-17 NOTE — Progress Notes (Signed)
   Follow-Up Visit   Subjective  Darlene Hubbard is a 70 y.o. female who presents for the following: Acne (Patient unable to tolerate Isotretinoin 20mg  po QOD due to extreme dryness and peeling of the skin. She discontinued the medication last month and would like to discuss other treatment options today.).  The following portions of the chart were reviewed this encounter and updated as appropriate:   Tobacco  Allergies  Meds  Problems  Med Hx  Surg Hx  Fam Hx     Review of Systems:  No other skin or systemic complaints except as noted in HPI or Assessment and Plan.  Objective  Well appearing patient in no apparent distress; mood and affect are within normal limits.  A focused examination was performed including the face. Relevant physical exam findings are noted in the Assessment and Plan.   Assessment & Plan  Acne vulgaris and Rosacea Face Severe and Chronic (present >1 year); currently on Isotretinoin and not to goal (must reach target dose based on weight and also have clear skin for 2 months prior to discontinuation in order to help prevent relapse)  Severe; On Isotretinoin -  requiring FDA mandated monthly evaluations and laboratory monitoring; Chronic and Persistent; Not to Goal  Due to s/e of peeling all over the skin plan to restart Isotretinoin at 10mg  po QW #4 0RF. May increase dose from there if tolerated.  isotretinoin (ACCUTANE) 10 MG capsule - Face Take one cap po QW.  Return in about 1 month (around 07/18/2021) for Isotretinoin f/u .  Luther Redo, CMA, am acting as scribe for Sarina Ser, MD . Documentation: I have reviewed the above documentation for accuracy and completeness, and I agree with the above.  Sarina Ser, MD

## 2021-06-17 NOTE — Patient Instructions (Signed)

## 2021-06-18 ENCOUNTER — Encounter: Payer: Self-pay | Admitting: Dermatology

## 2021-06-29 ENCOUNTER — Other Ambulatory Visit: Payer: Self-pay

## 2021-06-29 ENCOUNTER — Other Ambulatory Visit: Payer: Self-pay | Admitting: Dermatology

## 2021-06-29 DIAGNOSIS — L700 Acne vulgaris: Secondary | ICD-10-CM

## 2021-06-29 MED ORDER — ISOTRETINOIN 10 MG PO CAPS: ORAL_CAPSULE | ORAL | 0 refills | Status: DC

## 2021-06-29 NOTE — Progress Notes (Signed)
Isotretinoin packets only come in quantity of 30. RX sent in as replacement so patient can pick up prescription.

## 2021-06-30 ENCOUNTER — Other Ambulatory Visit: Payer: Self-pay

## 2021-06-30 DIAGNOSIS — L700 Acne vulgaris: Secondary | ICD-10-CM

## 2021-06-30 MED ORDER — ISOTRETINOIN 10 MG PO CAPS: ORAL_CAPSULE | ORAL | 0 refills | Status: DC

## 2021-06-30 NOTE — Progress Notes (Signed)
Rx needs to be written with instructions "as directed" in order to be filled.

## 2021-07-16 ENCOUNTER — Ambulatory Visit: Payer: Medicare PPO | Admitting: Dermatology

## 2021-08-15 ENCOUNTER — Encounter: Payer: Self-pay | Admitting: Emergency Medicine

## 2021-08-15 ENCOUNTER — Other Ambulatory Visit: Payer: Self-pay

## 2021-08-15 ENCOUNTER — Emergency Department
Admission: EM | Admit: 2021-08-15 | Discharge: 2021-08-15 | Disposition: A | Discharge status: Home / Self Care | Payer: Medicare PPO | Attending: Emergency Medicine | Admitting: Emergency Medicine

## 2021-08-15 ENCOUNTER — Emergency Department: Payer: Medicare PPO

## 2021-08-15 DIAGNOSIS — Z23 Encounter for immunization: Secondary | ICD-10-CM | POA: Diagnosis not present

## 2021-08-15 DIAGNOSIS — S81811A Laceration without foreign body, right lower leg, initial encounter: Secondary | ICD-10-CM | POA: Insufficient documentation

## 2021-08-15 DIAGNOSIS — J449 Chronic obstructive pulmonary disease, unspecified: Secondary | ICD-10-CM | POA: Insufficient documentation

## 2021-08-15 DIAGNOSIS — W268XXA Contact with other sharp object(s), not elsewhere classified, initial encounter: Secondary | ICD-10-CM | POA: Insufficient documentation

## 2021-08-15 DIAGNOSIS — J4540 Moderate persistent asthma, uncomplicated: Secondary | ICD-10-CM | POA: Diagnosis not present

## 2021-08-15 DIAGNOSIS — Z85828 Personal history of other malignant neoplasm of skin: Secondary | ICD-10-CM | POA: Diagnosis not present

## 2021-08-15 DIAGNOSIS — S81819A Laceration without foreign body, unspecified lower leg, initial encounter: Secondary | ICD-10-CM

## 2021-08-15 DIAGNOSIS — Z8616 Personal history of COVID-19: Secondary | ICD-10-CM | POA: Insufficient documentation

## 2021-08-15 DIAGNOSIS — Z7951 Long term (current) use of inhaled steroids: Secondary | ICD-10-CM | POA: Diagnosis not present

## 2021-08-15 MED ORDER — LIDOCAINE-EPINEPHRINE (PF) 1 %-1:200000 IJ SOLN
20.0000 mL | Freq: Once | INTRAMUSCULAR | Status: DC
  Filled 2021-08-15: qty 20

## 2021-08-15 MED ORDER — CEPHALEXIN 500 MG PO CAPS: 500.0000 mg | ORAL_CAPSULE | Freq: Three times a day (TID) | ORAL | 0 refills | Status: AC

## 2021-08-15 MED ORDER — TETANUS-DIPHTH-ACELL PERTUSSIS 5-2.5-18.5 LF-MCG/0.5 IM SUSY
0.5000 mL | PREFILLED_SYRINGE | Freq: Once | INTRAMUSCULAR | Status: AC
  Administered 2021-08-15: 0.5 mL via INTRAMUSCULAR
  Filled 2021-08-15: qty 0.5

## 2021-08-15 NOTE — ED Triage Notes (Signed)
Pt reports her right leg hot something and gave her a big skin tear to her right lower leg.

## 2021-08-15 NOTE — Discharge Instructions (Addendum)
-  Keep your dressing off for the next 48 hours -You may change dressings daily afterwards. You may wash lightly with soap and water as needed. -Take your antibiotics as prescribed.  -Take tylenol/ibuprofen for pain as needed -Return to the ED or schedule an appointment with a minute clinic or PCP within 7 days for suture removal.

## 2021-08-15 NOTE — ED Provider Notes (Signed)
[facility] Emergency Department Provider Note    ____________________________________________   Event Date/Time   First MD Initiated Contact with Patient 08/15/21 1724     (approximate)  I have reviewed the triage vital signs and the nursing notes.   HISTORY  Chief Complaint skin tear   HPI Darlene Hubbard is a 70 y.o. female, history of COPD and fibromyalgia, presents the emergency department for evaluation of skin tear.  Patient states that she was helping her family members move something when her leg scraped against a metal bar, causing a large skin tear along her right leg.  She states that she has very thin skin and she has had skin tears like this in the past.  This event occurred within the past few hours.  Denies fever/chills, decreased range of motion, knee pain, foot pain, chest pain, or shortness of breath.  Denies being on blood thinners.   History limited by: No limitations.  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   R upper back paraspinal - mod   Dysplastic nevus 03/17/2020   L lat mid lat tricep - mod   Fibromyalgia    GERD (gastroesophageal reflux disease)    Hiatal hernia    History of kidney stones    IBS (irritable bowel syndrome)    Mild obstructive sleep apnea    does not use Cpap   Morbid obesity ([place])    Pneumonia    Spinal stenosis    Varicose veins     Patient Active Problem List   Diagnosis Date Noted   COPD (chronic obstructive pulmonary disease) ([place]) 09/02/2019   History of nephrolithiasis 08/20/2019   Kidney stone 08/20/2019   History of 2019 novel coronavirus disease (COVID-19) 06/25/2019   Hyperlipidemia, mixed  03/08/2019   B12 deficiency 12/19/2017   History of DVT (deep vein thrombosis) 12/19/2017   Medicare annual wellness visit, initial 12/19/2017   Low serum vitamin D 06/27/2017   Varicose veins with pain 12/17/2016   Chronic venous insufficiency 12/17/2016   Trochanteric bursitis 12/17/2016   History of Clostridium difficile colitis 07/19/2016   Left patella fracture 06/03/2016   Family history of colon cancer 12/12/2015   RAD (reactive airway disease), moderate persistent, uncomplicated 99/37/1696   LPRD (laryngopharyngeal reflux disease) 12/22/2013    Past Surgical History:  Procedure Laterality Date   [place] BIOPSY Left 2000   neg   CHOLECYSTECTOMY     COLONOSCOPY  2008   COLONOSCOPY WITH PROPOFOL N/A 02/18/2016   Procedure: COLONOSCOPY WITH PROPOFOL;  Surgeon: [name], MD;  Location: [facility];  Service: Endoscopy;  Laterality: N/A;   CYSTOSCOPY W/ RETROGRADES Left 05/24/2019   Procedure: CYSTOSCOPY WITH RETROGRADE PYELOGRAM;  Surgeon: Royston Cowper, MD;  Location: [facility] OR[facility];  Service: Urology;  Laterality: Left;   dental work     EXTRACORPOREAL SHOCK WAVE LITHOTRIPSY Right 06/07/2019   Procedure: EXTRACORPOREAL SHOCK WAVE LITHOTRIPSY (ESWL);  Surgeon: Royston Cowper, MD;  Location: [facility] OR[facility];  Service: Urology;  Laterality: Right;  HARDWARE REMOVAL Left 01/19/2021   Procedure: HARDWARE REMOVAL;  Surgeon: Dereck Leep, MD;  Location: [facility] OR[facility];  Service: Orthopedics;  Laterality: Left;   ORIF PATELLA Left 06/02/2016   Procedure: OPEN REDUCTION INTERNAL (ORIF) FIXATION PATELLA;  Surgeon: Dereck Leep, MD;  Location: [facility];  Service: Orthopedics;  Laterality: Left;   PARTIAL HYSTERECTOMY     PATELLAR TENDON REPAIR Left 06/02/2016   Procedure: PATELLA TENDON REPAIR;  Surgeon: Dereck Leep, MD;  Location: [facility];  Service: Orthopedics;  Laterality: Left;   TUBAL LIGATION     UPPER GI  ENDOSCOPY  2008   URETEROSCOPY WITH HOLMIUM LASER LITHOTRIPSY Left 05/24/2019   Procedure: URETEROSCOPY WITH HOLMIUM LASER LITHOTRIPSY;  Surgeon: Royston Cowper, MD;  Location: [facility] OR[facility];  Service: Urology;  Laterality: Left;   vein closure procedure Bilateral 2010    Prior to Admission medications   Medication Sig Start Date End Date Taking? Authorizing Provider  cephALEXin (KEFLEX) 500 MG capsule Take 1 capsule (500 mg total) by mouth 3 (three) times daily for 10 days. 08/15/21 08/25/21 Yes Teodoro Spray, PA  acetaminophen (TYLENOL) 500 MG tablet Take by mouth.    [provider]  albuterol (VENTOLIN HFA) 108 (90 Base) MCG/ACT inhaler Inhale into the lungs. 03/11/21   [provider]  ALPRAZolam Duanne Moron) 0.5 MG tablet Take 0.5 mg by mouth at bedtime.  11/03/15   [provider]  EPINEPHrine 0.3 mg/0.3 mL IJ SOAJ injection Inject 0.3 mg into the muscle as needed for anaphylaxis. 04/11/19   [provider]  escitalopram (LEXAPRO) 10 MG tablet Take 10 mg by mouth daily. 08/09/19   [provider]  fluticasone-salmeterol (ADVAIR) 250-50 MCG/ACT AEPB Inhale 1 puff into the lungs 2 (two) times daily as needed (respiratory issues.).     [provider]  HYDROcodone-acetaminophen (NORCO) 5-325 MG tablet Take 1-2 tablets by mouth every 4 (four) hours as needed for moderate pain. 01/19/21   Hooten, Laurice Record, MD  isotretinoin (ACCUTANE) 10 MG capsule Take one capsule as directed. 06/30/21   Ralene Bathe, MD  mirabegron ER (MYRBETRIQ) 25 MG TB24 tablet Take 25 mg by mouth at bedtime.    [provider]  montelukast (SINGULAIR) 10 MG tablet Take 10 mg by mouth daily.    [provider]  montelukast (SINGULAIR) 10 MG tablet Take 1 tablet by mouth daily. 03/12/21   [provider]  propranolol (INDERAL) 60 MG tablet Take 60 mg by mouth daily.    [provider]    Allergies Tape, Doxycycline, Molds & smuts, Cat  hair extract, Diovan [valsartan], Sulfa antibiotics, and Sulfa drugs cross reactors  Family History  Problem Relation Age of Onset   Colon cancer Father 60   Thrombocytopenia Mother 4   Other Sister        Bone Marrow disorder   Breast cancer Neg Hx     Social History Social History   Tobacco Use   Smoking status: Never   Smokeless tobacco: Never  Vaping Use   Vaping Use: Never used  Substance Use Topics   Alcohol use: Not Currently   Drug use: No    Review of Systems  Constitutional: Negative for fever/chills, weight loss, or fatigue.  Eyes: Negative for visual changes or discharge.  ENT: Negative for congestion, hearing changes, or sore throat.  Gastrointestinal: Negative for abdominal pain, nausea/vomiting, or diarrhea.  Genitourinary: Negative for dysuria or hematuria.  Musculoskeletal:Negative for back pain or joint pain.  Skin: Positive for skin tear negative for rashes or lesions.  Neurological: Negative for headache, syncope, dizziness, tremors, or numbness/tingling.   10-point ROS otherwise negative. ____________________________________________   PHYSICAL EXAM:  VITAL SIGNS: ED Triage Vitals [08/15/21 1711]  Enc Vitals Group     BP      Pulse      Resp      Temp      Temp src      SpO2      Weight 164 lb (74.4 kg)     Height 5\' 4"  (1.626 m)     Head Circumference      Peak Flow      Pain Score 8     Pain Loc      Pain Edu?      Excl. in [place]?     Physical Exam Constitutional:      Appearance: Normal appearance.  HENT:     Head: Normocephalic and atraumatic.     Nose: Nose normal.     Mouth/Throat:     Mouth: Mucous membranes are moist.     Pharynx: Oropharynx is clear.  Eyes:     Extraocular Movements: Extraocular movements intact.     Conjunctiva/sclera: Conjunctivae normal.     Pupils: Pupils are equal, round, and reactive to light.  Cardiovascular:     Rate and Rhythm: Normal rate and regular rhythm.     Pulses: Normal pulses.      Heart sounds: Normal heart sounds.  Pulmonary:     Effort: Pulmonary effort is normal.     Breath sounds: Normal breath sounds.  Abdominal:     General: Abdomen is flat.     Palpations: Abdomen is soft.  Musculoskeletal:        General: No swelling, tenderness or deformity. Normal range of motion.     Cervical back: Normal range of motion and neck supple.     Right lower leg: No edema.     Left lower leg: No edema.       Legs:     Comments: Large skin tear on the anterior aspect of the right lower extremity, measuring approximately 12 cm in length and 6 cm in widtt with jagged edges.  Skin flap rolled superior to injury.  No active bleeding.  No surrounding erythema.  Pulse, motor, sensation intact distally.  Neurological:     Mental Status: She is alert and oriented to person, place, and time.  Psychiatric:        Mood and Affect: Mood normal.        Behavior: Behavior normal.        Thought Content: Thought content normal.        Judgment: Judgment normal.     ____________________________________________    LABS  (all labs ordered are listed, but only abnormal results are displayed)  Labs Reviewed - No data to display   ____________________________________________   EKG Not applicable.   ____________________________________________    RADIOLOGY I personally viewed and evaluated these images as part of my medical decision making, as well as reviewing the written report by the radiologist.  ED Provider Interpretation: Not applicable.  DG Chest 2 View  Result Date: 08/15/2021 CLINICAL DATA:  Productive cough.  Pain.  Flu 2 weeks ago. EXAM: CHEST - 2 VIEW COMPARISON:  Multiple chest x-rays since April 2011. FINDINGS: The heart size and mediastinal contours are within normal limits. Both lungs are clear. The visualized skeletal structures are unremarkable. IMPRESSION: No active cardiopulmonary  disease. Electronically Signed   By: Dorise Bullion III M.D.   On:  08/15/2021 17:46    ____________________________________________   PROCEDURES  .Marland KitchenLaceration Repair  Date/Time: 08/16/2021 9:34 AM Performed by: Teodoro Spray, PA Authorized by: Teodoro Spray, PA   Consent:    Consent obtained:  Verbal   Consent given by:  Patient   Risks discussed:  Infection, pain and poor cosmetic result   Alternatives discussed:  No treatment Universal protocol:    Patient identity confirmed:  Verbally with patient Anesthesia:    Anesthesia method:  Local infiltration   Local anesthetic:  Lidocaine 1% WITH epi Laceration details:    Location:  Leg   Leg location:  R lower leg   Length (cm):  12   Depth (mm):  2 Pre-procedure details:    Preparation:  Patient was prepped and draped in usual sterile fashion Exploration:    Limited defect created (wound extended): yes     Hemostasis achieved with:  Direct pressure   Wound exploration: wound explored through full range of motion and entire depth of wound visualized     Wound extent: no fascia violation noted, no nerve damage noted, no tendon damage noted and no underlying fracture noted     Contaminated: no   Treatment:    Area cleansed with:  Saline   Amount of cleaning:  Extensive   Irrigation solution:  Sterile saline   Irrigation volume:  1041ml   Irrigation method:  Pressure wash   Visualized foreign bodies/material removed: no     Debridement:  None Skin repair:    Repair method:  Steri-Strips and sutures   Suture size:  4-0   Suture material:  Prolene   Suture technique:  Running locked   Number of sutures:  5   Number of Steri-Strips:  5 Approximation:    Approximation:  Close Repair type:    Repair type:  Intermediate Post-procedure details:    Dressing:  Non-adherent dressing and tube gauze   Procedure completion:  Tolerated well, no immediate complications   Medications  Tdap (BOOSTRIX) injection 0.5 mL (0.5 mLs Intramuscular Given 08/15/21 2106)    Critical Care  performed: No  ____________________________________________   INITIAL IMPRESSION / ASSESSMENT AND PLAN / ED COURSE  Pertinent labs & imaging results that were available during my care of the patient were reviewed by me and considered in my medical decision making (see chart for details).       Darlene Hubbard is a 70 y.o. female, history of COPD and fibromyalgia, presents the emergency department for evaluation of skin tear.  Patient states that she was helping her family members move something when her leg scraped against a metal bar, causing a large skin tear along her right leg.  She states that she has very thin skin and she has had skin tears like this in the past.  This event occurred within the past few hours.  Denies fever/chills, decreased range of motion, knee pain, foot pain, chest pain, or shortness of breath.  Denies being on blood thinners.  Upon entering the room, patient appears well.  She is sitting upright comfortably in bed.  Physical exam remarkable for large skin tear on the anterior aspect of the right lower extremity with a approximate length of 12 cm in width of 6 cm with jagged edges.  Large skin flap folded superiorly.  No active bleeding or discharge.  Pulse, motor, sensation intact distally.  No surrounding erythema.  Patient  was given local anesthesia with 1% lidocaine with epi around the borders of the wound.  Laceration was closed using a simple running locking technique for the majority of the wound.  2 simple interrupted sutures were placed on a small 1 cm laceration just proximal to the larger skin tear.  Distally, 5 Steri-Strips were applied.  There is about a quarter sized open wound left that was unable to be sutured due to lack of viable skin.  Xeroform gauze dressing was applied.  The patient tolerated the repair well.  Advised the patient to limit mobility of her right lower extremity over the next week or so until her sutures can be removed by her PCP.  Advised  her to keep the dressings in place for at least 48 hours.  Afterwards, she may perform daily dressings and wash with soap and water as needed.  Additionally, she was prescribed cephalexin for antibiotic prophylaxis given the nature of the wound. I encouraged the patient to take her antibiotics as prescribed and return to the emergency department if her sutures become undone or if she experiences new/worsening symptoms.      ____________________________________________   FINAL CLINICAL IMPRESSION(S) / ED DIAGNOSES  Final diagnoses:  Skin tear of lower leg without complication, initial encounter     NEW MEDICATIONS STARTED DURING THIS VISIT:  ED Discharge Orders          Ordered    cephALEXin (KEFLEX) 500 MG capsule  3 times daily        08/15/21 2059             Note:  This document was prepared using Dragon voice recognition software and may include unintentional dictation errors.    Teodoro Spray, [place] 08/16/21 0263    Vanessa Felsenthal, MD 08/18/21 [phone]

## 2021-08-19 ENCOUNTER — Ambulatory Visit: Payer: Medicare PPO | Admitting: Dermatology

## 2021-09-07 ENCOUNTER — Ambulatory Visit: Payer: Medicare PPO | Admitting: Dermatology

## 2022-01-29 ENCOUNTER — Other Ambulatory Visit: Payer: Self-pay | Admitting: Family Medicine

## 2022-01-29 ENCOUNTER — Ambulatory Visit
Admission: RE | Admit: 2022-01-29 | Discharge: 2022-01-29 | Disposition: A | Discharge status: Home / Self Care | Payer: Medicare PPO | Source: Ambulatory Visit | Attending: Family Medicine | Admitting: Family Medicine

## 2022-01-29 DIAGNOSIS — R1032 Left lower quadrant pain: Secondary | ICD-10-CM | POA: Insufficient documentation

## 2022-01-29 LAB — POCT I-STAT CREATININE: Creatinine, Ser: 0.7 mg/dL (ref 0.44–1.00)

## 2022-01-29 MED ORDER — IOHEXOL 300 MG/ML  SOLN
100.0000 mL | Freq: Once | INTRAMUSCULAR | Status: AC | PRN
  Administered 2022-01-29: 100 mL via INTRAVENOUS

## 2022-03-23 NOTE — Progress Notes (Signed)
MRN : [id number]  Darlene Hubbard is a 71 y.o. (06-13-1951) female who presents with chief complaint of legs hurt and swell.  History of Present Illness:   The patient presents today for follow-up evaluation of her varicose veins.  Patient has had some swelling of her lower extremities.  She has previously had a DVT in the past.  The patient has also had numerous endovenous laser ablations on both great saphenous veins as well as a left accessory saphenous veins.  She continues to have varicosities.  She continues to use conservative therapy.  The patient is concerned due to her previous numerous issues she wanted to follow-up to ensure there were no worsening issues.   Today on venous duplex there is no evidence of DVT or superficial thrombophlebitis seen bilaterally.  No evidence of deep venous insufficiency or superficial venous reflux seen bilaterally.  The bilateral great saphenous veins remain closed as well as the left accessory saphenous vein.  No outpatient medications have been marked as taking for the 03/25/22 encounter (Appointment) with Delana Meyer, Dolores Lory, MD.    Past Medical History:  Diagnosis Date   Actinic keratosis 08/06/2015   L med lower leg (bx proven)   Allergy    Anxiety    Asthma    Basal cell carcinoma 08/28/2015   R ant to sideburn zygoma    Bursitis    BOTH SHOULDER AND HIPS   COPD (chronic obstructive pulmonary disease) ([place])    COVID-19 05/2019   Dysplastic nevus 02/28/2019   R upper back paraspinal - mod   Dysplastic nevus 03/17/2020   L lat mid lat tricep - mod   Fibromyalgia    GERD (gastroesophageal reflux disease)    Hiatal hernia    History of kidney stones    IBS (irritable bowel syndrome)    Mild obstructive sleep apnea    does not use Cpap   Morbid obesity ([place])    Pneumonia    Spinal stenosis    Varicose veins     Past Surgical History:  Procedure Laterality Date   [place] BIOPSY Left 2000   neg   CHOLECYSTECTOMY     COLONOSCOPY  2008   COLONOSCOPY WITH PROPOFOL N/A 02/18/2016   Procedure: COLONOSCOPY WITH PROPOFOL;  Surgeon: [name], MD;  Location: [facility];  Service: Endoscopy;  Laterality: N/A;   CYSTOSCOPY W/ RETROGRADES Left 05/24/2019   Procedure: CYSTOSCOPY WITH RETROGRADE PYELOGRAM;  Surgeon: Royston Cowper, MD;  Location: [facility];  Service: Urology;  Laterality: Left;   dental work     EXTRACORPOREAL SHOCK WAVE LITHOTRIPSY Right 06/07/2019   Procedure: EXTRACORPOREAL SHOCK WAVE LITHOTRIPSY (ESWL);  Surgeon: Royston Cowper, MD;  Location: [facility];  Service: Urology;  Laterality: Right;   HARDWARE REMOVAL Left 01/19/2021   Procedure: HARDWARE REMOVAL;  Surgeon: Dereck Leep, MD;  Location: [facility];  Service: Orthopedics;  Laterality: Left;   ORIF PATELLA Left 06/02/2016   Procedure: OPEN REDUCTION INTERNAL (ORIF) FIXATION PATELLA;  Surgeon: Dereck Leep, MD;  Location: [facility];  Service: Orthopedics;  Laterality: Left;   PARTIAL HYSTERECTOMY     PATELLAR TENDON REPAIR Left 06/02/2016   Procedure: PATELLA TENDON REPAIR;  Surgeon: Dereck Leep, MD;  Location: [facility];  Service: Orthopedics;  Laterality: Left;   TUBAL LIGATION  UPPER GI ENDOSCOPY  2008   URETEROSCOPY WITH HOLMIUM LASER LITHOTRIPSY Left 05/24/2019   Procedure: URETEROSCOPY WITH HOLMIUM LASER LITHOTRIPSY;  Surgeon: Royston Cowper, MD;  Location: [facility];  Service: Urology;  Laterality: Left;   vein closure procedure Bilateral 2010    Social History Social History   Tobacco Use   Smoking status: Never   Smokeless tobacco: Never  Vaping Use   Vaping Use: Never used  Substance Use Topics   Alcohol use: Not Currently   Drug use: No    Family History Family History  Problem Relation Age of Onset   Colon cancer Father 12   Thrombocytopenia Mother 33   Other Sister        Bone Marrow disorder   Breast cancer Neg Hx      Allergies  Allergen Reactions   Tape Other (See Comments)    Tears skin   Doxycycline Nausea And Vomiting   Molds & Smuts Other (See Comments)    allergic   Cat Hair Extract Itching    sneezing   Diovan [Valsartan] Rash    Edema   Sulfa Antibiotics Itching and Rash   Sulfa Drugs Cross Reactors Itching and Rash     REVIEW OF SYSTEMS (Negative unless checked)  Constitutional: '[]'$ Weight loss  '[]'$ Fever  '[]'$ Chills Cardiac: '[]'$ Chest pain   '[]'$ Chest pressure   '[]'$ Palpitations   '[]'$ Shortness of breath when laying flat   '[]'$ Shortness of breath with exertion. Vascular:  '[]'$ Pain in legs with walking   '[x]'$ Pain in legs at rest  '[]'$ History of DVT   '[]'$ Phlebitis   '[x]'$ Swelling in legs   '[]'$ Varicose veins   '[]'$ Non-healing ulcers Pulmonary:   '[]'$ Uses home oxygen   '[]'$ Productive cough   '[]'$ Hemoptysis   '[]'$ Wheeze  '[]'$ COPD   '[]'$ Asthma Neurologic:  '[]'$ Dizziness   '[]'$ Seizures   '[]'$ History of stroke   '[]'$ History of TIA  '[]'$ Aphasia   '[]'$ Vissual changes   '[]'$ Weakness or numbness in arm   '[]'$ Weakness or numbness in leg Musculoskeletal:   '[]'$ Joint swelling   '[]'$ Joint pain   '[]'$ Low back pain Hematologic:  '[]'$ Easy bruising  '[]'$ Easy bleeding   '[]'$ Hypercoagulable state   '[]'$ Anemic Gastrointestinal:  '[]'$ Diarrhea   '[]'$ Vomiting  '[]'$ Gastroesophageal reflux/heartburn   '[]'$ Difficulty swallowing. Genitourinary:  '[]'$ Chronic kidney disease   '[]'$ Difficult urination  '[]'$ Frequent urination   '[]'$ Blood in urine Skin:  '[]'$ Rashes   '[]'$ Ulcers  Psychological:  '[]'$ History of anxiety   '[]'$  History of major depression.  Physical Examination  There were no vitals filed for this visit. There is no height or weight on file to calculate BMI. Gen: WD/WN, NAD Head: [place]/AT, No temporalis wasting.  Ear/Nose/Throat: Hearing grossly intact, nares w/o erythema or drainage, pinna without lesions Eyes: PER, EOMI, sclera nonicteric.  Neck: Supple, no gross masses.  No JVD.  Pulmonary:  Good air movement, no audible wheezing, no use of accessory muscles.  Cardiac: RRR, precordium not  hyperdynamic. Vascular:  scattered varicosities present bilaterally.  Moderate venous stasis changes to the legs bilaterally.  2+ soft pitting edema  Vessel Right Left  Radial Palpable Palpable  Gastrointestinal: soft, non-distended. No guarding/no peritoneal signs.  Musculoskeletal: M/S 5/5 throughout.  No deformity.  Neurologic: CN 2-12 intact. Pain and light touch intact in extremities.  Symmetrical.  Speech is fluent. Motor exam as listed above. Psychiatric: Judgment intact, Mood & affect appropriate for pt's clinical situation. Dermatologic: Venous rashes no ulcers noted.  No changes consistent with cellulitis. Lymph : No lichenification or skin changes of chronic lymphedema.  CBC Lab  Results  Component Value Date   WBC 11.7 (H) 11/17/2020   HGB 14.1 11/17/2020   HCT 42.4 11/17/2020   MCV 89.6 11/17/2020   PLT 266 11/17/2020    BMET    Component Value Date/Time   NA 142 03/25/2021 1158   NA 139 04/26/2013 1716   K 4.3 03/25/2021 1158   K 3.4 (L) 04/26/2013 1716   CL 104 03/25/2021 1158   CL 104 04/26/2013 1716   CO2 27 03/25/2021 1158   CO2 30 04/26/2013 1716   GLUCOSE 92 03/25/2021 1158   GLUCOSE 100 (H) 11/17/2020 2039   GLUCOSE 97 04/26/2013 1716   BUN 14 03/25/2021 1158   BUN 18 04/26/2013 1716   CREATININE 0.70 01/29/2022 1619   CREATININE 0.85 04/26/2013 1716   CALCIUM 9.2 03/25/2021 1158   CALCIUM 8.8 04/26/2013 1716   GFRNONAA >60 11/17/2020 2039   GFRNONAA >60 04/26/2013 1716   GFRAA >60 05/10/2019 2019   GFRAA >60 04/26/2013 1716   CrCl cannot be calculated (Patient's most recent lab result is older than the maximum 21 days allowed.).  COAG No results found for: "INR", "PROTIME"  Radiology No results found.   Assessment/Plan 1. Varicose veins with pain No surgery or intervention at this point in time.    I have discussed with the patient venous insufficiency and why it  causes symptoms. I have discussed with the patient the chronic skin  changes that accompany venous insufficiency and the long term sequela such as infection and ulceration.  Patient will begin wearing graduated compression stockings or compression wraps on a daily basis.  The patient will put the compression on first thing in the morning and removing them in the evening. The patient is instructed specifically not to sleep in the compression.    In addition, behavioral modification including several periods of elevation of the lower extremities during the day will be continued. I have demonstrated that proper elevation is a position with the ankles at heart level.  The patient is instructed to begin routine exercise, especially walking on a daily basis  The patient will be assessed for a Lymph Pump depending on the effectiveness of conservative therapy and the control of the associated lymphedema.  - VAS [place] LOWER EXTREMITY VENOUS REFLUX; Future  2. Chronic venous insufficiency No surgery or intervention at this point in time.    I have discussed with the patient venous insufficiency and why it  causes symptoms. I have discussed with the patient the chronic skin changes that accompany venous insufficiency and the long term sequela such as infection and ulceration.  Patient will begin wearing graduated compression stockings or compression wraps on a daily basis.  The patient will put the compression on first thing in the morning and removing them in the evening. The patient is instructed specifically not to sleep in the compression.    In addition, behavioral modification including several periods of elevation of the lower extremities during the day will be continued. I have demonstrated that proper elevation is a position with the ankles at heart level.  The patient is instructed to begin routine exercise, especially walking on a daily basis  The patient will be assessed for a Lymph Pump depending on the effectiveness of conservative therapy and the control of the  associated lymphedema.  - VAS [place] LOWER EXTREMITY VENOUS REFLUX; Future  3. Chronic obstructive pulmonary disease, unspecified COPD type ([place]) Continue pulmonary medications and aerosols as already ordered, these medications have been reviewed and  there are no changes at this time.    4. Hyperlipidemia, mixed Continue statin as ordered and reviewed, no changes at this time     Hortencia Pilar, MD  03/23/2022 10:04 AM

## 2022-03-24 ENCOUNTER — Other Ambulatory Visit (INDEPENDENT_AMBULATORY_CARE_PROVIDER_SITE_OTHER): Payer: Self-pay | Admitting: Nurse Practitioner

## 2022-03-24 DIAGNOSIS — I83819 Varicose veins of unspecified lower extremities with pain: Secondary | ICD-10-CM

## 2022-03-25 ENCOUNTER — Ambulatory Visit (INDEPENDENT_AMBULATORY_CARE_PROVIDER_SITE_OTHER): Payer: Medicare PPO | Admitting: Vascular Surgery

## 2022-03-25 ENCOUNTER — Encounter (INDEPENDENT_AMBULATORY_CARE_PROVIDER_SITE_OTHER): Payer: Self-pay | Admitting: Vascular Surgery

## 2022-03-25 ENCOUNTER — Ambulatory Visit (INDEPENDENT_AMBULATORY_CARE_PROVIDER_SITE_OTHER): Payer: Medicare PPO

## 2022-03-25 VITALS — BP 127/78 | HR 77 | Resp 17 | Ht 63.0 in | Wt 161.0 lb

## 2022-03-25 DIAGNOSIS — E782 Mixed hyperlipidemia: Secondary | ICD-10-CM | POA: Diagnosis not present

## 2022-03-25 DIAGNOSIS — J449 Chronic obstructive pulmonary disease, unspecified: Secondary | ICD-10-CM

## 2022-03-25 DIAGNOSIS — I83819 Varicose veins of unspecified lower extremities with pain: Secondary | ICD-10-CM | POA: Diagnosis not present

## 2022-03-25 DIAGNOSIS — I872 Venous insufficiency (chronic) (peripheral): Secondary | ICD-10-CM | POA: Diagnosis not present

## 2022-03-26 ENCOUNTER — Other Ambulatory Visit: Payer: Self-pay | Admitting: Internal Medicine

## 2022-03-26 DIAGNOSIS — Z1231 Encounter for screening mammogram for malignant neoplasm of breast: Secondary | ICD-10-CM

## 2022-03-28 ENCOUNTER — Encounter (INDEPENDENT_AMBULATORY_CARE_PROVIDER_SITE_OTHER): Payer: Self-pay | Admitting: Vascular Surgery

## 2022-06-28 ENCOUNTER — Encounter (INDEPENDENT_AMBULATORY_CARE_PROVIDER_SITE_OTHER): Payer: Self-pay

## 2022-09-20 ENCOUNTER — Ambulatory Visit: Payer: Medicare PPO | Admitting: Dermatology

## 2022-12-02 ENCOUNTER — Ambulatory Visit
Admission: RE | Admit: 2022-12-02 | Discharge: 2022-12-02 | Disposition: A | Discharge status: Home / Self Care | Payer: Medicare PPO | Source: Ambulatory Visit | Attending: Internal Medicine | Admitting: Internal Medicine

## 2022-12-02 DIAGNOSIS — Z1231 Encounter for screening mammogram for malignant neoplasm of breast: Secondary | ICD-10-CM | POA: Diagnosis present

## 2023-01-17 ENCOUNTER — Other Ambulatory Visit: Payer: Self-pay | Admitting: Internal Medicine

## 2023-01-17 DIAGNOSIS — G3184 Mild cognitive impairment, so stated: Secondary | ICD-10-CM

## 2023-01-27 ENCOUNTER — Other Ambulatory Visit: Payer: Medicare PPO

## 2023-01-28 ENCOUNTER — Ambulatory Visit
Admission: RE | Admit: 2023-01-28 | Discharge: 2023-01-28 | Disposition: A | Discharge status: Home / Self Care | Payer: Medicare PPO | Source: Ambulatory Visit | Attending: Internal Medicine | Admitting: Internal Medicine

## 2023-01-28 DIAGNOSIS — G3184 Mild cognitive impairment, so stated: Secondary | ICD-10-CM | POA: Insufficient documentation

## 2023-01-28 DIAGNOSIS — I6782 Cerebral ischemia: Secondary | ICD-10-CM | POA: Diagnosis not present

## 2023-02-16 ENCOUNTER — Ambulatory Visit: Payer: Medicare PPO | Admitting: Dermatology

## 2023-02-16 VITALS — BP 114/66 | HR 60

## 2023-02-16 DIAGNOSIS — I8393 Asymptomatic varicose veins of bilateral lower extremities: Secondary | ICD-10-CM

## 2023-02-16 DIAGNOSIS — L578 Other skin changes due to chronic exposure to nonionizing radiation: Secondary | ICD-10-CM

## 2023-02-16 DIAGNOSIS — D692 Other nonthrombocytopenic purpura: Secondary | ICD-10-CM

## 2023-02-16 DIAGNOSIS — Z1283 Encounter for screening for malignant neoplasm of skin: Secondary | ICD-10-CM | POA: Diagnosis not present

## 2023-02-16 DIAGNOSIS — L570 Actinic keratosis: Secondary | ICD-10-CM

## 2023-02-16 DIAGNOSIS — Z85828 Personal history of other malignant neoplasm of skin: Secondary | ICD-10-CM

## 2023-02-16 DIAGNOSIS — D489 Neoplasm of uncertain behavior, unspecified: Secondary | ICD-10-CM

## 2023-02-16 DIAGNOSIS — L820 Inflamed seborrheic keratosis: Secondary | ICD-10-CM

## 2023-02-16 DIAGNOSIS — W908XXA Exposure to other nonionizing radiation, initial encounter: Secondary | ICD-10-CM

## 2023-02-16 DIAGNOSIS — C44311 Basal cell carcinoma of skin of nose: Secondary | ICD-10-CM

## 2023-02-16 DIAGNOSIS — D1801 Hemangioma of skin and subcutaneous tissue: Secondary | ICD-10-CM

## 2023-02-16 DIAGNOSIS — Z86018 Personal history of other benign neoplasm: Secondary | ICD-10-CM

## 2023-02-16 DIAGNOSIS — L814 Other melanin hyperpigmentation: Secondary | ICD-10-CM

## 2023-02-16 DIAGNOSIS — L821 Other seborrheic keratosis: Secondary | ICD-10-CM

## 2023-02-16 NOTE — Progress Notes (Unsigned)
Follow-Up Visit   Subjective  Darlene Hubbard is a 72 y.o. female who presents for the following: Skin Cancer Screening and Full Body Skin Exam hx of bcc  Reports some spots at left forehead and right side of nose would like checked.    The patient presents for Total-Body Skin Exam (TBSE) for skin cancer screening and mole check. The patient has spots, moles and lesions to be evaluated, some may be new or changing and the patient has concerns that these could be cancer.    The following portions of the chart were reviewed this encounter and updated as appropriate: medications, allergies, medical history  Review of Systems:  No other skin or systemic complaints except as noted in HPI or Assessment and Plan.  Objective  Well appearing patient in no apparent distress; mood and affect are within normal limits.  A full examination was performed including scalp, head, eyes, ears, nose, lips, neck, chest, axillae, abdomen, back, buttocks, bilateral upper extremities, bilateral lower extremities, hands, feet, fingers, toes, fingernails, and toenails. All findings within normal limits unless otherwise noted below.   Relevant physical exam findings are noted in the Assessment and Plan.  left temple x 2 (2) Erythematous stuck-on, waxy papule or plaque  right nose 0.7 cm red papule          Right Temple x 2 (2) Erythematous thin papules/macules with gritty scale.     Assessment & Plan   LENTIGINES, SEBORRHEIC KERATOSES, HEMANGIOMAS - Benign normal skin lesions - Benign-appearing - Call for any changes  MELANOCYTIC NEVI - Tan-brown and/or pink-flesh-colored symmetric macules and papules - Benign appearing on exam today - Observation - Call clinic for new or changing moles - Recommend daily use of broad spectrum spf 30+ sunscreen to sun-exposed areas.    Purpura - Chronic; persistent and recurrent.  Treatable, but not curable. - Violaceous macules and patches - Benign -  Related to trauma, age, sun damage and/or use of blood thinners, chronic use of topical and/or oral steroids - Observe - Can use OTC arnica containing moisturizer such as Dermend Bruise Formula if desired - Call for worsening or other concerns  Varicose Veins/Spider Veins - Dilated blue, purple or red veins at the lower extremities - Reassured - Smaller vessels can be treated by sclerotherapy (a procedure to inject a medicine into the veins to make them disappear) if desired, but the treatment is not covered by insurance. Larger vessels may be covered if symptomatic and we would refer to vascular surgeon if treatment desired.  ACTINIC DAMAGE - Chronic condition, secondary to cumulative UV/sun exposure - diffuse scaly erythematous macules with underlying dyspigmentation - Recommend daily broad spectrum sunscreen SPF 30+ to sun-exposed areas, reapply every 2 hours as needed.  - Staying in the shade or wearing long sleeves, sun glasses (UVA+UVB protection) and wide brim hats (4-inch brim around the entire circumference of the hat) are also recommended for sun protection.  - Call for new or changing lesions.  HISTORY OF BASAL CELL CARCINOMA OF THE SKIN Right anterior sideburn zygoma  07/2015 - No evidence of recurrence today - Recommend regular full body skin exams - Recommend daily broad spectrum sunscreen SPF 30+ to sun-exposed areas, reapply every 2 hours as needed.  - Call if any new or changing lesions are noted between office visits  HISTORY OF DYSPLASTIC NEVUS Left lateral mid later tricep moderate 7 /2021 Right upper back paraspinal moderate 02/2019 No evidence of recurrence today Recommend regular full body skin exams  Recommend daily broad spectrum sunscreen SPF 30+ to sun-exposed areas, reapply every 2 hours as needed.  Call if any new or changing lesions are noted between office visits   SKIN CANCER SCREENING PERFORMED TODAY.    Inflamed seborrheic keratosis (2) left  temple x 2  Symptomatic, irritating, patient would like treated.  Destruction of lesion - left temple x 2 Complexity: simple   Destruction method: cryotherapy   Informed consent: discussed and consent obtained   Timeout:  patient name, date of birth, surgical site, and procedure verified Lesion destroyed using liquid nitrogen: Yes   Region frozen until ice ball extended beyond lesion: Yes   Outcome: patient tolerated procedure well with no complications   Post-procedure details: wound care instructions given    Neoplasm of uncertain behavior right nose  Epidermal / dermal shaving  Lesion diameter (cm):  0.7 Informed consent: discussed and consent obtained   Timeout: patient name, date of birth, surgical site, and procedure verified   Procedure prep:  Patient was prepped and draped in usual sterile fashion Prep type:  Isopropyl alcohol Anesthesia: the lesion was anesthetized in a standard fashion   Anesthetic:  1% lidocaine w/ epinephrine 1-100,000 buffered w/ 8.4% NaHCO3 Instrument used: flexible razor blade   Hemostasis achieved with: pressure, aluminum chloride and electrodesiccation   Outcome: patient tolerated procedure well   Post-procedure details: sterile dressing applied and wound care instructions given   Dressing type: bandage and petrolatum    Destruction of lesion Complexity: extensive   Destruction method: electrodesiccation and curettage   Informed consent: discussed and consent obtained   Timeout:  patient name, date of birth, surgical site, and procedure verified Procedure prep:  Patient was prepped and draped in usual sterile fashion Prep type:  Isopropyl alcohol Anesthesia: the lesion was anesthetized in a standard fashion   Anesthetic:  1% lidocaine w/ epinephrine 1-100,000 buffered w/ 8.4% NaHCO3 Curettage performed in three different directions: Yes   Electrodesiccation performed over the curetted area: Yes   Lesion length (cm):  0.7 Lesion width (cm):   0.7 Margin per side (cm):  0.2 Final wound size (cm):  1.1 Hemostasis achieved with:  pressure, aluminum chloride and electrodesiccation Outcome: patient tolerated procedure well with no complications   Post-procedure details: sterile dressing applied and wound care instructions given   Dressing type: bandage and petrolatum    Specimen 1 - Surgical pathology Differential Diagnosis: r/o BCC  Check Margins: No  R/o bcc   Shv removal  Edc   Actinic keratosis (2) Right Temple x 2  Actinic keratoses are precancerous spots that appear secondary to cumulative UV radiation exposure/sun exposure over time. They are chronic with expected duration over 1 year. A portion of actinic keratoses will progress to squamous cell carcinoma of the skin. It is not possible to reliably predict which spots will progress to skin cancer and so treatment is recommended to prevent development of skin cancer.  Recommend daily broad spectrum sunscreen SPF 30+ to sun-exposed areas, reapply every 2 hours as needed.  Recommend staying in the shade or wearing long sleeves, sun glasses (UVA+UVB protection) and wide brim hats (4-inch brim around the entire circumference of the hat). Call for new or changing lesions.  Destruction of lesion - Right Temple x 2 Complexity: simple   Destruction method: cryotherapy   Informed consent: discussed and consent obtained   Timeout:  patient name, date of birth, surgical site, and procedure verified Lesion destroyed using liquid nitrogen: Yes   Region frozen until  ice ball extended beyond lesion: Yes   Outcome: patient tolerated procedure well with no complications   Post-procedure details: wound care instructions given     Return in about 1 year (around 02/16/2024) for TBSE.  IAsher Muir, CMA, am acting as scribe for Armida Sans, MD.   Documentation: I have reviewed the above documentation for accuracy and completeness, and I agree with the above.  Armida Sans, MD

## 2023-02-16 NOTE — Patient Instructions (Addendum)
     Biopsy Wound Care Instructions  Leave the original bandage on for 24 hours if possible.  If the bandage becomes soaked or soiled before that time, it is OK to remove it and examine the wound.  A small amount of post-operative bleeding is normal.  If excessive bleeding occurs, remove the bandage, place gauze over the site and apply continuous pressure (no peeking) over the area for 30 minutes. If this does not work, please call our clinic as soon as possible or page your doctor if it is after hours.   Once a day, cleanse the wound with soap and water. It is fine to shower. If a thick crust develops you may use a Q-tip dipped into dilute hydrogen peroxide (mix 1:1 with water) to dissolve it.  Hydrogen peroxide can slow the healing process, so use it only as needed.    After washing, apply petroleum jelly (Vaseline) or an antibiotic ointment if your doctor prescribed one for you, followed by a bandage.    For best healing, the wound should be covered with a layer of ointment at all times. If you are not able to keep the area covered with a bandage to hold the ointment in place, this may mean re-applying the ointment several times a day.  Continue this wound care until the wound has healed and is no longer open.   Itching and mild discomfort is normal during the healing process. However, if you develop pain or severe itching, please call our office.   If you have any discomfort, you can take Tylenol (acetaminophen) or ibuprofen as directed on the bottle. (Please do not take these if you have an allergy to them or cannot take them for another reason).  Some redness, tenderness and white or yellow material in the wound is normal healing.  If the area becomes very sore and red, or develops a thick yellow-green material (pus), it may be infected; please notify us.    If you have stitches, return to clinic as directed to have the stitches removed. You will continue wound care for 2-3 days after  the stitches are removed.   Wound healing continues for up to one year following surgery. It is not unusual to experience pain in the scar from time to time during the interval.  If the pain becomes severe or the scar thickens, you should notify the office.    A slight amount of redness in a scar is expected for the first six months.  After six months, the redness will fade and the scar will soften and fade.  The color difference becomes less noticeable with time.  If there are any problems, return for a post-op surgery check at your earliest convenience.  To improve the appearance of the scar, you can use silicone scar gel, cream, or sheets (such as Mederma or Serica) every night for up to one year. These are available over the counter (without a prescription).  Please call our office at [phone] for any questions or concerns.    Electrodesiccation and Curettage ("Scrape and Burn") Wound Care Instructions  Leave the original bandage on for 24 hours if possible.  If the bandage becomes soaked or soiled before that time, it is OK to remove it and examine the wound.  A small amount of post-operative bleeding is normal.  If excessive bleeding occurs, remove the bandage, place gauze over the site and apply continuous pressure (no peeking) over the area for 30 minutes. If this   does not work, please call our clinic as soon as possible or page your doctor if it is after hours.   Once a day, cleanse the wound with soap and water. It is fine to shower. If a thick crust develops you may use a Q-tip dipped into dilute hydrogen peroxide (mix 1:1 with water) to dissolve it.  Hydrogen peroxide can slow the healing process, so use it only as needed.    After washing, apply petroleum jelly (Vaseline) or an antibiotic ointment if your doctor prescribed one for you, followed by a bandage.    For best healing, the wound should be covered with a layer of ointment at all times. If you are not able to keep the  area covered with a bandage to hold the ointment in place, this may mean re-applying the ointment several times a day.  Continue this wound care until the wound has healed and is no longer open. It may take several weeks for the wound to heal and close.  Itching and mild discomfort is normal during the healing process.  If you have any discomfort, you can take Tylenol (acetaminophen) or ibuprofen as directed on the bottle. (Please do not take these if you have an allergy to them or cannot take them for another reason).  Some redness, tenderness and white or yellow material in the wound is normal healing.  If the area becomes very sore and red, or develops a thick yellow-green material (pus), it may be infected; please notify us.    Wound healing continues for up to one year following surgery. It is not unusual to experience pain in the scar from time to time during the interval.  If the pain becomes severe or the scar thickens, you should notify the office.    A slight amount of redness in a scar is expected for the first six months.  After six months, the redness will fade and the scar will soften and fade.  The color difference becomes less noticeable with time.  If there are any problems, return for a post-op surgery check at your earliest convenience.  To improve the appearance of the scar, you can use silicone scar gel, cream, or sheets (such as Mederma or Serica) every night for up to one year. These are available over the counter (without a prescription).  Please call our office at [phone] for any questions or concerns.Actinic keratoses are precancerous spots that appear secondary to cumulative UV radiation exposure/sun exposure over time. They are chronic with expected duration over 1 year. A portion of actinic keratoses will progress to squamous cell carcinoma of the skin. It is not possible to reliably predict which spots will progress to skin cancer and so treatment is recommended to  prevent development of skin cancer.  Recommend daily broad spectrum sunscreen SPF 30+ to sun-exposed areas, reapply every 2 hours as needed.  Recommend staying in the shade or wearing long sleeves, sun glasses (UVA+UVB protection) and wide brim hats (4-inch brim around the entire circumference of the hat). Call for new or changing lesions.   Cryotherapy Aftercare  Wash gently with soap and water everyday.   Apply Vaseline and Band-Aid daily until healed.   Seborrheic Keratosis  What causes seborrheic keratoses? Seborrheic keratoses are harmless, common skin growths that first appear during adult life.  As time goes by, more growths appear.  Some people may develop a large number of them.  Seborrheic keratoses appear on both covered and uncovered body parts.  They   are not caused by sunlight.  The tendency to develop seborrheic keratoses can be inherited.  They vary in color from skin-colored to gray, brown, or even black.  They can be either smooth or have a rough, warty surface.   Seborrheic keratoses are superficial and look as if they were stuck on the skin.  Under the microscope this type of keratosis looks like layers upon layers of skin.  That is why at times the top layer may seem to fall off, but the rest of the growth remains and re-grows.    Treatment Seborrheic keratoses do not need to be treated, but can easily be removed in the office.  Seborrheic keratoses often cause symptoms when they rub on clothing or jewelry.  Lesions can be in the way of shaving.  If they become inflamed, they can cause itching, soreness, or burning.  Removal of a seborrheic keratosis can be accomplished by freezing, burning, or surgery. If any spot bleeds, scabs, or grows rapidly, please return to have it checked, as these can be an indication of a skin cancer.       Melanoma ABCDEs  Melanoma is the most dangerous type of skin cancer, and is the leading cause of death from skin disease.  You are more  likely to develop melanoma if you: Have light-colored skin, light-colored eyes, or red or blond hair Spend a lot of time in the sun Tan regularly, either outdoors or in a tanning bed Have had blistering sunburns, especially during childhood Have a close family member who has had a melanoma Have atypical moles or large birthmarks  Early detection of melanoma is key since treatment is typically straightforward and cure rates are extremely high if we catch it early.   The first sign of melanoma is often a change in a mole or a new dark spot.  The ABCDE system is a way of remembering the signs of melanoma.  A for asymmetry:  The two halves do not match. B for border:  The edges of the growth are irregular. C for color:  A mixture of colors are present instead of an even brown color. D for diameter:  Melanomas are usually (but not always) greater than 6mm - the size of a pencil eraser. E for evolution:  The spot keeps changing in size, shape, and color.  Please check your skin once per month between visits. You can use a small mirror in front and a large mirror behind you to keep an eye on the back side or your body.   If you see any new or changing lesions before your next follow-up, please call to schedule a visit.  Please continue daily skin protection including broad spectrum sunscreen SPF 30+ to sun-exposed areas, reapplying every 2 hours as needed when you're outdoors.   Staying in the shade or wearing long sleeves, sun glasses (UVA+UVB protection) and wide brim hats (4-inch brim around the entire circumference of the hat) are also recommended for sun protection.    Due to recent changes in healthcare laws, you may see results of your pathology and/or laboratory studies on MyChart before the doctors have had a chance to review them. We understand that in some cases there may be results that are confusing or concerning to you. Please understand that not all results are received at the same  time and often the doctors may need to interpret multiple results in order to provide you with the best plan of care or course of treatment.   Therefore, we ask that you please give us 2 business days to thoroughly review all your results before contacting the office for clarification. Should we see a critical lab result, you will be contacted sooner.   If You Need Anything After Your Visit  If you have any questions or concerns for your doctor, please call our main line at [phone] and press option 4 to reach your doctor's medical assistant. If no one answers, please leave a voicemail as directed and we will return your call as soon as possible. Messages left after 4 pm will be answered the following business day.   You may also send us a message via MyChart. We typically respond to MyChart messages within 1-2 business days.  For prescription refills, please ask your pharmacy to contact our office. Our fax number is [phone].  If you have an urgent issue when the clinic is closed that cannot wait until the next business day, you can page your doctor at the number below.    Please note that while we do our best to be available for urgent issues outside of office hours, we are not available 24/7.   If you have an urgent issue and are unable to reach us, you may choose to seek medical care at your doctor's office, retail clinic, urgent care center, or emergency room.  If you have a medical emergency, please immediately call 911 or go to the emergency department.  Pager Numbers  - Dr. Kowalski: [phone]  - Dr. Moye: [phone]  - Dr. Stewart: [phone]  In the event of inclement weather, please call our main line at [phone] for an update on the status of any delays or closures.  Dermatology Medication Tips: Please keep the boxes that topical medications come in in order to help keep track of the instructions about where and how to use these. Pharmacies typically print  the medication instructions only on the boxes and not directly on the medication tubes.   If your medication is too expensive, please contact our office at [phone] option 4 or send us a message through MyChart.   We are unable to tell what your co-pay for medications will be in advance as this is different depending on your insurance coverage. However, we may be able to find a substitute medication at lower cost or fill out paperwork to get insurance to cover a needed medication.   If a prior authorization is required to get your medication covered by your insurance company, please allow us 1-2 business days to complete this process.  Drug prices often vary depending on where the prescription is filled and some pharmacies may offer cheaper prices.  The website www.goodrx.com contains coupons for medications through different pharmacies. The prices here do not account for what the cost may be with help from insurance (it may be cheaper with your insurance), but the website can give you the price if you did not use any insurance.  - You can print the associated coupon and take it with your prescription to the pharmacy.  - You may also stop by our office during regular business hours and pick up a GoodRx coupon card.  - If you need your prescription sent electronically to a different pharmacy, notify our office through [facility] MyChart or by phone at [phone] option 4.     Si Usted Necesita Algo Despus de Su Visita  Tambin puede enviarnos un mensaje a travs de MyChart. Por lo general respondemos a los mensajes de MyChart   en el transcurso de 1 a 2 das hbiles.  Para renovar recetas, por favor pida a su farmacia que se ponga en contacto con nuestra oficina. Nuestro nmero de fax es el [phone].  Si tiene un asunto urgente cuando la clnica est cerrada y que no puede esperar hasta el siguiente da hbil, puede llamar/localizar a su doctor(a) al nmero que aparece a  continuacin.   Por favor, tenga en cuenta que aunque hacemos todo lo posible para estar disponibles para asuntos urgentes fuera del horario de oficina, no estamos disponibles las 24 horas del da, los 7 das de la semana.   Si tiene un problema urgente y no puede comunicarse con nosotros, puede optar por buscar atencin mdica  en el consultorio de su doctor(a), en una clnica privada, en un centro de atencin urgente o en una sala de emergencias.  Si tiene una emergencia mdica, por favor llame inmediatamente al 911 o vaya a la sala de emergencias.  Nmeros de bper  - Dr. Kowalski: [phone]  - Dra. Moye: [phone]  - Dra. Stewart: [phone]  En caso de inclemencias del tiempo, por favor llame a nuestra lnea principal al [phone] para una actualizacin sobre el estado de cualquier retraso o cierre.  Consejos para la medicacin en dermatologa: Por favor, guarde las cajas en las que vienen los medicamentos de uso tpico para ayudarle a seguir las instrucciones sobre dnde y cmo usarlos. Las farmacias generalmente imprimen las instrucciones del medicamento slo en las cajas y no directamente en los tubos del medicamento.   Si su medicamento es muy caro, por favor, pngase en contacto con nuestra oficina llamando al [phone] y presione la opcin 4 o envenos un mensaje a travs de MyChart.   No podemos decirle cul ser su copago por los medicamentos por adelantado ya que esto es diferente dependiendo de la cobertura de su seguro. Sin embargo, es posible que podamos encontrar un medicamento sustituto a menor costo o llenar un formulario para que el seguro cubra el medicamento que se considera necesario.   Si se requiere una autorizacin previa para que su compaa de seguros cubra su medicamento, por favor permtanos de 1 a 2 das hbiles para completar este proceso.  Los precios de los medicamentos varan con frecuencia dependiendo del lugar de dnde se surte la receta  y alguna farmacias pueden ofrecer precios ms baratos.  El sitio web www.goodrx.com tiene cupones para medicamentos de diferentes farmacias. Los precios aqu no tienen en cuenta lo que podra costar con la ayuda del seguro (puede ser ms barato con su seguro), pero el sitio web puede darle el precio si no utiliz ningn seguro.  - Puede imprimir el cupn correspondiente y llevarlo con su receta a la farmacia.  - Tambin puede pasar por nuestra oficina durante el horario de atencin regular y recoger una tarjeta de cupones de GoodRx.  - Si necesita que su receta se enve electrnicamente a una farmacia diferente, informe a nuestra oficina a travs de MyChart de [facility] o por telfono llamando al [phone] y presione la opcin 4.  

## 2023-02-18 ENCOUNTER — Encounter: Payer: Self-pay | Admitting: Dermatology

## 2023-02-22 ENCOUNTER — Telehealth: Payer: Self-pay

## 2023-02-22 NOTE — Telephone Encounter (Signed)
-----   Message from Deirdre Evener, MD sent at 02/22/2023  1:03 PM EDT ----- Diagnosis Skin , right nose BASAL CELL CARCINOMA, NODULAR PATTERN  Cancer = BCC Already treated Recheck next visit

## 2023-02-22 NOTE — Telephone Encounter (Signed)
Advised pt of bx results/sh ?

## 2023-03-21 ENCOUNTER — Ambulatory Visit (INDEPENDENT_AMBULATORY_CARE_PROVIDER_SITE_OTHER): Payer: Medicare PPO

## 2023-03-21 ENCOUNTER — Encounter (INDEPENDENT_AMBULATORY_CARE_PROVIDER_SITE_OTHER): Payer: Self-pay | Admitting: Vascular Surgery

## 2023-03-21 ENCOUNTER — Ambulatory Visit (INDEPENDENT_AMBULATORY_CARE_PROVIDER_SITE_OTHER): Payer: Medicare PPO | Admitting: Vascular Surgery

## 2023-03-21 VITALS — BP 127/82 | HR 64 | Resp 17 | Ht 64.0 in | Wt 163.8 lb

## 2023-03-21 DIAGNOSIS — I83819 Varicose veins of unspecified lower extremities with pain: Secondary | ICD-10-CM

## 2023-03-21 DIAGNOSIS — E782 Mixed hyperlipidemia: Secondary | ICD-10-CM | POA: Diagnosis not present

## 2023-03-21 DIAGNOSIS — J449 Chronic obstructive pulmonary disease, unspecified: Secondary | ICD-10-CM

## 2023-03-21 DIAGNOSIS — Z86718 Personal history of other venous thrombosis and embolism: Secondary | ICD-10-CM | POA: Diagnosis not present

## 2023-03-21 DIAGNOSIS — I872 Venous insufficiency (chronic) (peripheral): Secondary | ICD-10-CM

## 2023-03-21 NOTE — Progress Notes (Signed)
MRN : [id number]  Darlene Hubbard is a 72 y.o. (02-03-1951) female who presents with chief complaint of varicose veins hurt.  History of Present Illness:   The patient presents today for follow-up evaluation of her varicose veins.  Patient has had some swelling of her lower extremities.  She has previously had a DVT in the past.  The patient has also had numerous endovenous laser ablations on both great saphenous veins as well as a left accessory saphenous veins.  She continues to have varicosities.  She continues to use conservative therapy.  The patient is concerned due to her previous numerous issues she wanted to follow-up to ensure there were no worsening issues.   Today on venous duplex there is no evidence of DVT or superficial thrombophlebitis seen bilaterally.  Reflux noted in both the deep and the superficial venous systems bilaterally.  No outpatient medications have been marked as taking for the 03/21/23 encounter (Appointment) with Gilda Crease, Latina Craver, MD.    Past Medical History:  Diagnosis Date   Actinic keratosis 08/06/2015   L med lower leg (bx proven)   Allergy    Anxiety    Asthma    Basal cell carcinoma 08/28/2015   R ant to sideburn zygoma    Basal cell carcinoma 02/16/2023   R nose, EDC   Bursitis    BOTH SHOULDER AND HIPS   COPD (chronic obstructive pulmonary disease) (HCC)    COVID-19 05/2019   Dysplastic nevus 02/28/2019   R upper back paraspinal - mod   Dysplastic nevus 03/17/2020   L lat mid lat tricep - mod   Fibromyalgia    GERD (gastroesophageal reflux disease)    Hiatal hernia    History of kidney stones    IBS (irritable bowel syndrome)    Mild obstructive sleep apnea    does not use Cpap   Morbid obesity (HCC)    Pneumonia    Spinal stenosis    Varicose veins     Past Surgical History:  Procedure Laterality Date   ABDOMINAL HYSTERECTOMY  1993   APPENDECTOMY  1995   BACK SURGERY     BREAST BIOPSY Left 2000   neg    CHOLECYSTECTOMY     COLONOSCOPY  2008   COLONOSCOPY WITH PROPOFOL N/A 02/18/2016   Procedure: COLONOSCOPY WITH PROPOFOL;  Surgeon: Earline Mayotte, MD;  Location: [facility];  Service: Endoscopy;  Laterality: N/A;   CYSTOSCOPY W/ RETROGRADES Left 05/24/2019   Procedure: CYSTOSCOPY WITH RETROGRADE PYELOGRAM;  Surgeon: Orson Ape, MD;  Location: [facility];  Service: Urology;  Laterality: Left;   dental work     EXTRACORPOREAL SHOCK WAVE LITHOTRIPSY Right 06/07/2019   Procedure: EXTRACORPOREAL SHOCK WAVE LITHOTRIPSY (ESWL);  Surgeon: Orson Ape, MD;  Location: [facility];  Service: Urology;  Laterality: Right;   HARDWARE REMOVAL Left 01/19/2021   Procedure: HARDWARE REMOVAL;  Surgeon: Donato Heinz, MD;  Location: [facility];  Service: Orthopedics;  Laterality: Left;   ORIF PATELLA Left 06/02/2016   Procedure: OPEN REDUCTION INTERNAL (ORIF) FIXATION PATELLA;  Surgeon: Donato Heinz, MD;  Location: [facility];  Service: Orthopedics;  Laterality: Left;   PARTIAL HYSTERECTOMY     PATELLAR TENDON REPAIR Left 06/02/2016   Procedure: PATELLA TENDON REPAIR;  Surgeon: Donato Heinz, MD;  Location: [facility];  Service: Orthopedics;  Laterality: Left;   TUBAL LIGATION     UPPER GI  ENDOSCOPY  2008   URETEROSCOPY WITH HOLMIUM LASER LITHOTRIPSY Left 05/24/2019   Procedure: URETEROSCOPY WITH HOLMIUM LASER LITHOTRIPSY;  Surgeon: Orson Ape, MD;  Location: [facility];  Service: Urology;  Laterality: Left;   vein closure procedure Bilateral 2010    Social History Social History   Tobacco Use   Smoking status: Never   Smokeless tobacco: Never  Vaping Use   Vaping status: Never Used  Substance Use Topics   Alcohol use: Not Currently   Drug use: No    Family History Family History  Problem Relation Age of Onset   Colon cancer Father 42   Thrombocytopenia Mother 40   Other Sister        Bone Marrow disorder   Breast cancer Neg Hx     Allergies  Allergen Reactions   Tape Other (See  Comments)    Tears skin   Doxycycline Nausea And Vomiting   Molds & Smuts Other (See Comments)    allergic   Cat Hair Extract Itching    sneezing   Diovan [Valsartan] Rash    Edema   Sulfa Antibiotics Itching and Rash   Sulfa Drugs Cross Reactors Itching and Rash     REVIEW OF SYSTEMS (Negative unless checked)  Constitutional: [] Weight loss  [] Fever  [] Chills Cardiac: [] Chest pain   [] Chest pressure   [] Palpitations   [] Shortness of breath when laying flat   [] Shortness of breath with exertion. Vascular:  [] Pain in legs with walking   [x] Pain in legs with standing  [] History of DVT   [] Phlebitis   [] Swelling in legs   [x] Varicose veins   [] Non-healing ulcers Pulmonary:   [] Uses home oxygen   [] Productive cough   [] Hemoptysis   [] Wheeze  [x] COPD   [] Asthma Neurologic:  [] Dizziness   [] Seizures   [] History of stroke   [] History of TIA  [] Aphasia   [] Vissual changes   [] Weakness or numbness in arm   [] Weakness or numbness in leg Musculoskeletal:   [] Joint swelling   [] Joint pain   [] Low back pain Hematologic:  [] Easy bruising  [] Easy bleeding   [] Hypercoagulable state   [] Anemic Gastrointestinal:  [] Diarrhea   [] Vomiting  [] Gastroesophageal reflux/heartburn   [] Difficulty swallowing. Genitourinary:  [] Chronic kidney disease   [] Difficult urination  [] Frequent urination   [] Blood in urine Skin:  [] Rashes   [] Ulcers  Psychological:  [] History of anxiety   []  History of major depression.  Physical Examination  There were no vitals filed for this visit. There is no height or weight on file to calculate BMI. Gen: WD/WN, NAD Head: [place]/AT, No temporalis wasting.  Ear/Nose/Throat: Hearing grossly intact, nares w/o erythema or drainage, pinna without lesions Eyes: PER, EOMI, sclera nonicteric.  Neck: Supple, no gross masses.  No JVD.  Pulmonary:  Good air movement, no audible wheezing, no use of accessory muscles.  Cardiac: RRR, precordium not hyperdynamic. Vascular:  Varicosities present  bilaterally.  Veins are tender to palpation  Moderate venous stasis changes to the legs bilaterally.  Trace soft pitting edema CEAP C3sEpAsPr Vessel Right Left  Radial Palpable Palpable  Gastrointestinal: soft, non-distended. No guarding/no peritoneal signs.  Musculoskeletal: M/S 5/5 throughout.  No deformity.  Neurologic: CN 2-12 intact. Pain and light touch intact in extremities.  Symmetrical.  Speech is fluent. Motor exam as listed above. Psychiatric: Judgment intact, Mood & affect appropriate for pt's clinical situation. Dermatologic: Venous rashes no ulcers noted.  No changes consistent with cellulitis. Lymph : No lichenification or skin changes of chronic  lymphedema.  CBC Lab Results  Component Value Date   WBC 11.7 (H) 11/17/2020   HGB 14.1 11/17/2020   HCT 42.4 11/17/2020   MCV 89.6 11/17/2020   PLT 266 11/17/2020    BMET    Component Value Date/Time   NA 142 03/25/2021 1158   NA 139 04/26/2013 1716   K 4.3 03/25/2021 1158   K 3.4 (L) 04/26/2013 1716   CL 104 03/25/2021 1158   CL 104 04/26/2013 1716   CO2 27 03/25/2021 1158   CO2 30 04/26/2013 1716   GLUCOSE 92 03/25/2021 1158   GLUCOSE 100 (H) 11/17/2020 2039   GLUCOSE 97 04/26/2013 1716   BUN 14 03/25/2021 1158   BUN 18 04/26/2013 1716   CREATININE 0.70 01/29/2022 1619   CREATININE 0.85 04/26/2013 1716   CALCIUM 9.2 03/25/2021 1158   CALCIUM 8.8 04/26/2013 1716   GFRNONAA >60 11/17/2020 2039   GFRNONAA >60 04/26/2013 1716   GFRAA >60 05/10/2019 2019   GFRAA >60 04/26/2013 1716   CrCl cannot be calculated (Patient's most recent lab result is older than the maximum 21 days allowed.).  COAG No results found for: "INR", "PROTIME"  Radiology No results found.   Assessment/Plan 1. Chronic venous insufficiency No surgery or intervention at this point in time.     I have discussed with the patient venous insufficiency and why it  causes symptoms. I have discussed with the patient the chronic skin changes  that accompany venous insufficiency and the long term sequela such as infection and ulceration.  Patient will begin wearing graduated compression stockings or compression wraps on a daily basis.  The patient will put the compression on first thing in the morning and removing them in the evening. The patient is instructed specifically not to sleep in the compression.     In addition, behavioral modification including several periods of elevation of the lower extremities during the day will be continued. I have demonstrated that proper elevation is a position with the ankles at heart level.   The patient is instructed to begin routine exercise, especially walking on a daily basis  2. Chronic obstructive pulmonary disease, unspecified COPD type (HCC) Continue pulmonary medications and aerosols as already ordered, these medications have been reviewed and there are no changes at this time.   3. History of DVT (deep vein thrombosis) Duplex ultrasound obtained today is negative for acute or chronic deep vein thrombosis.  The patient completed her anticoagulation and is now off her oral agent.  Compression therapy is recommended.  4. Hyperlipidemia, mixed Continue statin as ordered and reviewed, no changes at this time     Levora Dredge, MD  03/21/2023 8:45 AM

## 2023-04-06 ENCOUNTER — Other Ambulatory Visit (HOSPITAL_COMMUNITY): Payer: Self-pay | Admitting: Neurology

## 2023-04-06 DIAGNOSIS — G3184 Mild cognitive impairment, so stated: Secondary | ICD-10-CM

## 2023-04-13 ENCOUNTER — Ambulatory Visit (HOSPITAL_COMMUNITY)
Admission: RE | Admit: 2023-04-13 | Discharge: 2023-04-13 | Disposition: A | Discharge status: Home / Self Care | Payer: Medicare PPO | Source: Ambulatory Visit | Attending: Neurology | Admitting: Neurology

## 2023-04-13 DIAGNOSIS — G3184 Mild cognitive impairment, so stated: Secondary | ICD-10-CM | POA: Insufficient documentation

## 2023-07-19 ENCOUNTER — Encounter: Payer: Self-pay | Admitting: Dermatology

## 2023-07-19 ENCOUNTER — Ambulatory Visit: Payer: Medicare PPO | Admitting: Dermatology

## 2023-07-19 DIAGNOSIS — L821 Other seborrheic keratosis: Secondary | ICD-10-CM

## 2023-07-19 DIAGNOSIS — L720 Epidermal cyst: Secondary | ICD-10-CM

## 2023-07-19 DIAGNOSIS — W540XXD Bitten by dog, subsequent encounter: Secondary | ICD-10-CM

## 2023-07-19 DIAGNOSIS — S41152A Open bite of left upper arm, initial encounter: Secondary | ICD-10-CM

## 2023-07-19 DIAGNOSIS — S51852D Open bite of left forearm, subsequent encounter: Secondary | ICD-10-CM

## 2023-07-19 MED ORDER — AMOXICILLIN-POT CLAVULANATE 875-125 MG PO TABS
1.0000 | ORAL_TABLET | Freq: Two times a day (BID) | ORAL | 0 refills | Status: AC
Start: 2023-07-19 — End: 2023-07-26

## 2023-07-19 NOTE — Progress Notes (Signed)
   Follow-Up Visit   Subjective  Darlene Hubbard is a 72 y.o. female who presents for the following: recheck BCC site. Right nose. Bx and treated with EDC by Dr. Gwen Pounds 02/16/2023. Has noticed as small bump near that area. Concerned with recurrence.   Check dog bite at left hand/wrist. Was bitten by her own dog while at the vet 07/12/2023. Dog bitten again by her dog while it was eating. Left forearm. Was seen at urgent care and was prescribed Augmentin to take BID for 7 days. Would like checked to make sure healing well.   Check spots on left lower abdomen. Raised.   The patient has spots, moles and lesions to be evaluated, some may be new or changing and the patient may have concern these could be cancer.   The following portions of the chart were reviewed this encounter and updated as appropriate: medications, allergies, medical history  Review of Systems:  No other skin or systemic complaints except as noted in HPI or Assessment and Plan.  Objective  Well appearing patient in no apparent distress; mood and affect are within normal limits.  A focused examination was performed of the following areas: Face, arms, abdomen  Relevant exam findings are noted in the Assessment and Plan.  Left Forearm Grouped crusted ulcerations with surrounding erythema on left distal forearm         Assessment & Plan   Milium - tiny firm white papule at right nose at site of [facility] that was treated with [facility] 02/16/23 - type of cyst - benign - No sign of recurrence of BCC - sometimes these will clear with nightly OTC adapalene/Differin 0.1% gel or retinol. - may be extracted if symptomatic - observe  SEBORRHEIC KERATOSIS - Stuck-on, waxy, tan-brown papules and/or plaques at left lower abdomen  - Benign-appearing - Discussed benign etiology and prognosis. - Observe - Call for any changes   Dog bite of left upper extremity, initial encounter Left Forearm  Finish augmentin prescription from  urgent care. If wounds are still tender, start prescription for 7 more days (875-125 mg BID x 7 more days; would be a total of 14 days)  amoxicillin-clavulanate (AUGMENTIN) 875-125 MG tablet - Left Forearm Take 1 tablet by mouth 2 (two) times daily for 7 days.  Milia  Seborrheic keratoses    Return in 2 weeks (on 08/02/2023) for wound recheck.  I, Lawson Radar, CMA, am acting as scribe for Elie Goody, MD.   Documentation: I have reviewed the above documentation for accuracy and completeness, and I agree with the above.  Elie Goody, MD

## 2023-07-19 NOTE — Patient Instructions (Signed)
Continue Augmentin for 7 more days after finishing prescription from urgent care     Seborrheic Keratosis  What causes seborrheic keratoses? Seborrheic keratoses are harmless, common skin growths that first appear during adult life.  As time goes by, more growths appear.  Some people may develop a large number of them.  Seborrheic keratoses appear on both covered and uncovered body parts.  They are not caused by sunlight.  The tendency to develop seborrheic keratoses can be inherited.  They vary in color from skin-colored to gray, brown, or even black.  They can be either smooth or have a rough, warty surface.   Seborrheic keratoses are superficial and look as if they were stuck on the skin.  Under the microscope this type of keratosis looks like layers upon layers of skin.  That is why at times the top layer may seem to fall off, but the rest of the growth remains and re-grows.    Treatment Seborrheic keratoses do not need to be treated, but can easily be removed in the office.  Seborrheic keratoses often cause symptoms when they rub on clothing or jewelry.  Lesions can be in the way of shaving.  If they become inflamed, they can cause itching, soreness, or burning.  Removal of a seborrheic keratosis can be accomplished by freezing, burning, or surgery. If any spot bleeds, scabs, or grows rapidly, please return to have it checked, as these can be an indication of a skin cancer.    Due to recent changes in healthcare laws, you may see results of your pathology and/or laboratory studies on MyChart before the doctors have had a chance to review them. We understand that in some cases there may be results that are confusing or concerning to you. Please understand that not all results are received at the same time and often the doctors may need to interpret multiple results in order to provide you with the best plan of care or course of treatment. Therefore, we ask that you please give [place] 2 business days  to thoroughly review all your results before contacting the office for clarification. Should we see a critical lab result, you will be contacted sooner.   If You Need Anything After Your Visit  If you have any questions or concerns for your doctor, please call our main line at [phone] and press option 4 to reach your doctor's medical assistant. If no one answers, please leave a voicemail as directed and we will return your call as soon as possible. Messages left after 4 pm will be answered the following business day.   You may also send [place] a message via MyChart. We typically respond to MyChart messages within 1-2 business days.  For prescription refills, please ask your pharmacy to contact our office. Our fax number is [phone].  If you have an urgent issue when the clinic is closed that cannot wait until the next business day, you can page your doctor at the number below.    Please note that while we do our best to be available for urgent issues outside of office hours, we are not available 24/7.   If you have an urgent issue and are unable to reach [place], you may choose to seek medical care at your doctor's office, retail clinic, urgent care center, or emergency room.  If you have a medical emergency, please immediately call 911 or go to the emergency department.  Pager Numbers  - Dr. Gwen Pounds: [phone]  - Dr. Roseanne Reno: [phone]  -  Dr. Katrinka Blazing: [phone]   In the event of inclement weather, please call our main line at [phone] for an update on the status of any delays or closures.  Dermatology Medication Tips: Please keep the boxes that topical medications come in in order to help keep track of the instructions about where and how to use these. Pharmacies typically print the medication instructions only on the boxes and not directly on the medication tubes.   If your medication is too expensive, please contact our office at [phone] option 4 or send [place] a  message through MyChart.   We are unable to tell what your co-pay for medications will be in advance as this is different depending on your insurance coverage. However, we may be able to find a substitute medication at lower cost or fill out paperwork to get insurance to cover a needed medication.   If a prior authorization is required to get your medication covered by your insurance company, please allow [place] 1-2 business days to complete this process.  Drug prices often vary depending on where the prescription is filled and some pharmacies may offer cheaper prices.  The website www.goodrx.com contains coupons for medications through different pharmacies. The prices here do not account for what the cost may be with help from insurance (it may be cheaper with your insurance), but the website can give you the price if you did not use any insurance.  - You can print the associated coupon and take it with your prescription to the pharmacy.  - You may also stop by our office during regular business hours and pick up a GoodRx coupon card.  - If you need your prescription sent electronically to a different pharmacy, notify our office through [facility] or by phone at [phone] option 4.     Si Usted Necesita Algo Despus de Su Visita  Tambin puede enviarnos un mensaje a travs de Clinical cytogeneticist. Por lo general respondemos a los mensajes de MyChart en el transcurso de 1 a 2 das hbiles.  Para renovar recetas, por favor pida a su farmacia que se ponga en contacto con nuestra oficina. Annie Sable de fax es [place] [phone].  Si tiene un asunto urgente cuando la clnica est cerrada y que no puede esperar hasta el siguiente da hbil, puede llamar/localizar a su doctor(a) al nmero que aparece a continuacin.   Por favor, tenga en cuenta que aunque hacemos todo lo posible para estar disponibles para asuntos urgentes fuera del horario de [place], no estamos disponibles las 24 horas del da, los 7  [address]  [address] de la [place].   Si tiene un problema urgente y no puede comunicarse con nosotros, puede optar por buscar atencin mdica  en el consultorio de su doctor(a), en una clnica privada, en un centro de atencin urgente o en una sala de emergencias.  Si tiene Engineer, drilling, por favor llame inmediatamente al 911 o vaya a la sala de emergencias.  Nmeros de bper  - Dr. Gwen Pounds: [phone]  - Dra. Roseanne Reno: [phone]  - Dr. Katrinka Blazing: [phone]   En caso de inclemencias del tiempo, por favor llame a Lacy Duverney principal al [phone] para una actualizacin sobre el [place] de cualquier retraso o cierre.  Consejos para la medicacin en dermatologa: Por favor, guarde las cajas en las que vienen los medicamentos de uso tpico para ayudarle a seguir las instrucciones sobre dnde y cmo usarlos. Las farmacias generalmente imprimen las instrucciones del medicamento slo en las cajas y no directamente en  los tubos del medicamento.   Si su medicamento es muy caro, por favor, pngase en contacto con Rolm Gala llamando al [phone] y presione la opcin 4 o envenos un mensaje a travs de Clinical cytogeneticist.   No podemos decirle cul ser su copago por los medicamentos por adelantado ya que esto es diferente dependiendo de la cobertura de su seguro. Sin embargo, es posible que podamos encontrar un medicamento sustituto a Audiological scientist un formulario para que el seguro cubra el medicamento que se considera necesario.   Si se requiere una autorizacin previa para que su compaa de seguros [place] su medicamento, por favor permtanos de 1 a 2 das hbiles para completar [address]  Los precios de los medicamentos varan con frecuencia dependiendo del Environmental consultant de dnde se surte la receta y alguna farmacias pueden ofrecer precios ms baratos.  El sitio web www.goodrx.com tiene cupones para medicamentos de Health and safety inspector. Los precios aqu no tienen en cuenta lo que podra costar con  la ayuda del seguro (puede ser ms barato con su seguro), pero el sitio web puede darle el precio si no utiliz Tourist information centre manager.  - Puede imprimir el cupn correspondiente y llevarlo con su receta a la farmacia.  - Tambin puede pasar por nuestra oficina durante el horario de atencin regular y Education officer, museum una tarjeta de cupones de GoodRx.  - Si necesita que su receta se enve electrnicamente a una farmacia diferente, informe a nuestra oficina a travs de MyChart de [place] o por telfono llamando al [phone] y presione la opcin 4.

## 2023-07-26 ENCOUNTER — Other Ambulatory Visit: Payer: Self-pay | Admitting: Internal Medicine

## 2023-07-26 DIAGNOSIS — Z1231 Encounter for screening mammogram for malignant neoplasm of breast: Secondary | ICD-10-CM

## 2023-08-02 ENCOUNTER — Ambulatory Visit: Payer: Medicare PPO | Admitting: Dermatology

## 2023-08-08 DIAGNOSIS — G309 Alzheimer's disease, unspecified: Secondary | ICD-10-CM | POA: Insufficient documentation

## 2023-10-27 ENCOUNTER — Telehealth: Payer: Self-pay | Admitting: Emergency Medicine

## 2023-10-27 NOTE — Telephone Encounter (Signed)
 Notified Aldona Bar

## 2023-10-27 NOTE — Telephone Encounter (Signed)
 Lelon Mast called because she wants to come to this office to get her mother in law checked for dementia. They have already went to a specialist and wants a second opinion from some one who specializes. Patient does not want to change her PCP but would still like to come to this office to see someone. Please advise.   Lelon Mast wants her mother in law to see [facility].

## 2023-11-10 ENCOUNTER — Ambulatory Visit: Attending: Neurology | Admitting: Speech Pathology

## 2023-11-10 DIAGNOSIS — R41841 Cognitive communication deficit: Secondary | ICD-10-CM | POA: Diagnosis present

## 2023-11-10 NOTE — Therapy (Unsigned)
 OUTPATIENT SPEECH LANGUAGE PATHOLOGY  COGNITION EVALUATION   Patient Name: Darlene Hubbard MRN: [id number] DOB:Jan 19, 1951, 73 y.o., female Today's Date: 11/10/2023  PCP: Bethann Punches, MD REFERRING PROVIDER: Cristopher Peru, MD   End of Session - 11/10/23 1101     Visit Number 1    Number of Visits 17    Date for SLP Re-Evaluation 01/05/24    Authorization Type [employer] [facility] Medicare    Progress Note Due on Visit 10    SLP Start Time 1100    SLP Stop Time  1145    SLP Time Calculation (min) 45 min    Activity Tolerance Patient tolerated treatment well             Past Medical History:  Diagnosis Date   Actinic keratosis 08/06/2015   L med lower leg (bx proven)   Allergy    Anxiety    Asthma    Basal cell carcinoma 08/28/2015   R ant to sideburn zygoma    Basal cell carcinoma 02/16/2023   R nose, EDC   Bursitis    BOTH SHOULDER AND HIPS   COPD (chronic obstructive pulmonary disease) (HCC)    COVID-19 05/2019   Dysplastic nevus 02/28/2019   R upper back paraspinal - mod   Dysplastic nevus 03/17/2020   L lat mid lat tricep - mod   Fibromyalgia    GERD (gastroesophageal reflux disease)    Hiatal hernia    History of kidney stones    IBS (irritable bowel syndrome)    Mild obstructive sleep apnea    does not use Cpap   Morbid obesity (HCC)    Pneumonia    Spinal stenosis    Varicose veins    Past Surgical History:  Procedure Laterality Date   ABDOMINAL HYSTERECTOMY  1993   APPENDECTOMY  1995   BACK SURGERY     BREAST BIOPSY Left 2000   neg   CHOLECYSTECTOMY     COLONOSCOPY  2008   COLONOSCOPY WITH PROPOFOL N/A 02/18/2016   Procedure: COLONOSCOPY WITH PROPOFOL;  Surgeon: Earline Mayotte, MD;  Location: [facility];  Service: Endoscopy;  Laterality: N/A;   CYSTOSCOPY W/ RETROGRADES Left 05/24/2019   Procedure: CYSTOSCOPY WITH RETROGRADE PYELOGRAM;  Surgeon: Orson Ape, MD;  Location: [facility];  Service: Urology;  Laterality: Left;   dental  work     EXTRACORPOREAL SHOCK WAVE LITHOTRIPSY Right 06/07/2019   Procedure: EXTRACORPOREAL SHOCK WAVE LITHOTRIPSY (ESWL);  Surgeon: Orson Ape, MD;  Location: [facility];  Service: Urology;  Laterality: Right;   HARDWARE REMOVAL Left 01/19/2021   Procedure: HARDWARE REMOVAL;  Surgeon: Donato Heinz, MD;  Location: [facility];  Service: Orthopedics;  Laterality: Left;   ORIF PATELLA Left 06/02/2016   Procedure: OPEN REDUCTION INTERNAL (ORIF) FIXATION PATELLA;  Surgeon: Donato Heinz, MD;  Location: [facility];  Service: Orthopedics;  Laterality: Left;   PARTIAL HYSTERECTOMY     PATELLAR TENDON REPAIR Left 06/02/2016   Procedure: PATELLA TENDON REPAIR;  Surgeon: Donato Heinz, MD;  Location: [facility];  Service: Orthopedics;  Laterality: Left;   TUBAL LIGATION     UPPER GI ENDOSCOPY  2008   URETEROSCOPY WITH HOLMIUM LASER LITHOTRIPSY Left 05/24/2019   Procedure: URETEROSCOPY WITH HOLMIUM LASER LITHOTRIPSY;  Surgeon: Orson Ape, MD;  Location: [facility];  Service: Urology;  Laterality: Left;   vein closure procedure Bilateral 2010   Patient Active Problem List   Diagnosis Date Noted   COPD (chronic  obstructive pulmonary disease) (HCC) 09/02/2019   History of nephrolithiasis 08/20/2019   Kidney stone 08/20/2019   History of 2019 novel coronavirus disease (COVID-19) 06/25/2019   Hyperlipidemia, mixed 03/08/2019   B12 deficiency 12/19/2017   History of DVT (deep vein thrombosis) 12/19/2017   Medicare annual wellness visit, initial 12/19/2017   Low serum vitamin D 06/27/2017   Varicose veins with pain 12/17/2016   Chronic venous insufficiency 12/17/2016   Trochanteric bursitis 12/17/2016   History of Clostridium difficile colitis 07/19/2016   Left patella fracture 06/03/2016   Family history of colon cancer 12/12/2015   RAD (reactive airway disease), moderate persistent, uncomplicated 11/26/2015   LPRD (laryngopharyngeal reflux disease) 12/22/2013    ONSET DATE: date of referral  11/07/2023; pt reports intermittent symptoms several years prior   REFERRING DIAG: G31.84 (ICD-10-CM) - Mild cognitive impairment of uncertain or unknown etiology   THERAPY DIAG:  Cognitive communication deficit  Rationale for Evaluation and Treatment Rehabilitation  SUBJECTIVE:   SUBJECTIVE STATEMENT: "I have to write everything down, I can't remember anything and I don't like it all" Pt accompanied by: self  PERTINENT HISTORY: Pt is a 73 year old female that per recent neurology note presents with "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  MRI 04/13/2023 IMPRESSION: 1. No acute intracranial abnormality. 2. NeuroQuant volumetric analysis of the brain -   Briefly, the comparison with age and gender matched reference reveals whole brain volume at the 25th percentile, Hippocampal volume at the 33rd percentile, and thalamic volume below the 25th percentile.  PAIN:  Are you having pain? No   FALLS: Has patient fallen in last 6 months?  No  LIVING ENVIRONMENT: Lives with: lives alone Lives in: House/apartment  PLOF:  Level of assistance: Independent with ADLs, Independent with IADLs Employment: Retired   PATIENT GOALS   to assess and improve memory  OBJECTIVE:   COGNITIVE COMMUNICATION Overall cognitive status: Impaired Areas of impairment:  Oriented to person Oriented to place Oriented to situation Attention: Impaired: Sustained, Selective Memory: Impaired: Immediate Working Short term Prospective Awareness: Impaired: Intellectual, Emergent, Anticipatory, and Error awareness:   % Executive function: Impaired: Comment: all impaired by memory deficits Behavior: Restless and Impulsive Impaired Functional  Impairments: variable medication administration, pt unable to recall the name of her church, reports that her daughter-in-law attends appts with her to supplement recall of information, inconsistencies observed with comments made throughout the session when providing history. Rehab receptionist reports that pt called mutlipe times over 2 days to verify time/date of this evaluation.   AUDITORY COMPREHENSION  Overall auditory comprehension: Appears intact YES/NO questions: Appears intact Following directions:  impaired by memory deficits Conversation: Simple Interfering components: attention, awareness, and working memory  READING COMPREHENSION: functional  EXPRESSION: verbal  VERBAL EXPRESSION:   Overall verbal expression: Appears intact Level of generative/spontaneous verbalization: conversation Automatic speech: name: intact and social response: intact  Repetition: Appears intact Naming:  functional Pragmatics: Appears intact Non-verbal means of communication: N/A  WRITTEN EXPRESSION: Dominant hand: right Written expression: Appears intact  ORAL MOTOR EXAMINATION Facial : WFL Lingual: WFL Velum: WFL Mandible: WFL Cough: WFL Voice: WFL  MOTOR SPEECH: Overall motor speech: Appears intact Articulation: Appears intact Intelligibility: Intelligible Motor planning: Appears intact  STANDARDIZED ASSESSMENTS: Addenbrooke's Cognitive Examination - ACE III The Addenbrooke's Cognitive Examination-III (  ACE-III) is a brief cognitive test that assesses five cognitive domains. The total score is 100 with higher scores indicating better cognitive functioning. Cut off scores of 88 and 82 are recommended for suspicion of dementia (88 has sensitivity of 1.00 and specificity of 0.96, 82 has sensitivity of 0.93 and specificity of 1.00). American Version B  Attention 11/18  Memory 15/26  Fluency 11/14  Language 24/26  Visuospatial 14/16  TOTAL ACE- III Score 75/100     PATIENT REPORTED  OUTCOME MEASURES (PROM): To be completed over the next 3 sessions   TODAY'S TREATMENT:  Improve use of external aids by writing down when bills are due as well as amount then check off bills when she pays them   PATIENT EDUCATION: Education details: results of this assessment, ST POC Person educated: Patient Education method: Explanation Education comprehension: needs further education   HOME EXERCISE PROGRAM:   See above   GOALS:  Goals reviewed with patient? Yes  SHORT TERM GOALS: Target date: 10 sessions  With Supervision A, patient will use strategies to improve memory for important information with 90% acc. Independently (ie., white board, daily planner/calendar, Apps on phone).   Baseline: Goal status: INITIAL  2.  Patient will report engagement in cognitive activities outside of ST for 5/7 days.  Baseline: Goal status: INITIAL   LONG TERM GOALS: Target date: 01/05/2024   With Supervision A, patient will use external aids to manage appointments, errands, and household chores consistently between 4 therapy sessions.  Baseline:  Goal status: INITIAL   2.   With Supervision A, patient will report <2 missed (medications/bills/appointments) over 2 week period with use of external memory aids/strategies.  Baseline:  Goal status: INITIAL  3. With Supervision A, patient will demonstrate knowledge of appropriate activities to support cognitive and  language function outside of ST with assistance from family.   Baseline:  Goal status: INITIAL   ASSESSMENT:  CLINICAL IMPRESSION: Patient is a 73 y.o. 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 Pt presents with mild to moderate cognitive impairment c/b severe deficits in memory specifically delayed recall of information, short term memory and working memory as well as prospective memory.   OBJECTIVE IMPAIRMENTS include  attention, memory, and awareness. These impairments are limiting patient from managing medications, managing appointments, managing finances, household responsibilities, and ADLs/IADLs. Factors affecting potential to achieve goals and functional outcome are ability to learn/carryover information, medical prognosis, and severity of impairments. Patient will benefit from skilled SLP services to address above impairments and improve overall function.  REHAB POTENTIAL: Good  PLAN: SLP FREQUENCY: 1-2x/week  SLP DURATION: 8 weeks  PLANNED INTERVENTIONS: Environmental controls, Cognitive reorganization, Internal/external aids, Functional tasks, SLP instruction and feedback, Compensatory strategies, and Patient/family education   Khoen Genet B. Dreama Saa, M.S., CCC-SLP, Tree surgeon Certified Brain Injury Specialist [facility]  [facility] Rehabilitation Services Office [phone] Ascom [phone] Fax [phone]

## 2023-11-15 ENCOUNTER — Ambulatory Visit: Admitting: Speech Pathology

## 2023-11-15 DIAGNOSIS — R41841 Cognitive communication deficit: Secondary | ICD-10-CM | POA: Diagnosis not present

## 2023-11-15 NOTE — Therapy (Unsigned)
 OUTPATIENT SPEECH LANGUAGE PATHOLOGY  COGNITION TREATMENT NOTE   Patient Name: Darlene Hubbard MRN: [id number] DOB:[birth year], 73 y.o., female Today's Date: 11/15/2023  PCP: Bethann Punches, MD REFERRING PROVIDER: Cristopher Peru, MD   End of Session - 11/15/23 1013     Visit Number 2    Number of Visits 17    Date for SLP Re-Evaluation 01/05/24    Authorization Type [employer] [facility] Medicare    Progress Note Due on Visit 10    SLP Start Time 1015    SLP Stop Time  1100    SLP Time Calculation (min) 45 min    Activity Tolerance Patient tolerated treatment well             Past Medical History:  Diagnosis Date   Actinic keratosis 08/06/2015   L med lower leg (bx proven)   Allergy    Anxiety    Asthma    Basal cell carcinoma 08/28/2015   R ant to sideburn zygoma    Basal cell carcinoma 02/16/2023   R nose, EDC   Bursitis    BOTH SHOULDER AND HIPS   COPD (chronic obstructive pulmonary disease) (HCC)    COVID-19 05/2019   Dysplastic nevus 02/28/2019   R upper back paraspinal - mod   Dysplastic nevus 03/17/2020   L lat mid lat tricep - mod   Fibromyalgia    GERD (gastroesophageal reflux disease)    Hiatal hernia    History of kidney stones    IBS (irritable bowel syndrome)    Mild obstructive sleep apnea    does not use Cpap   Morbid obesity (HCC)    Pneumonia    Spinal stenosis    Varicose veins    Past Surgical History:  Procedure Laterality Date   ABDOMINAL HYSTERECTOMY  1993   APPENDECTOMY  1995   BACK SURGERY     BREAST BIOPSY Left 2000   neg   CHOLECYSTECTOMY     COLONOSCOPY  2008   COLONOSCOPY WITH PROPOFOL N/A 02/18/2016   Procedure: COLONOSCOPY WITH PROPOFOL;  Surgeon: Earline Mayotte, MD;  Location: [facility];  Service: Endoscopy;  Laterality: N/A;   CYSTOSCOPY W/ RETROGRADES Left 05/24/2019   Procedure: CYSTOSCOPY WITH RETROGRADE PYELOGRAM;  Surgeon: Orson Ape, MD;  Location: [facility];  Service: Urology;  Laterality: Left;    dental work     EXTRACORPOREAL SHOCK WAVE LITHOTRIPSY Right 06/07/2019   Procedure: EXTRACORPOREAL SHOCK WAVE LITHOTRIPSY (ESWL);  Surgeon: Orson Ape, MD;  Location: [facility];  Service: Urology;  Laterality: Right;   HARDWARE REMOVAL Left 01/19/2021   Procedure: HARDWARE REMOVAL;  Surgeon: Donato Heinz, MD;  Location: [facility];  Service: Orthopedics;  Laterality: Left;   ORIF PATELLA Left 06/02/2016   Procedure: OPEN REDUCTION INTERNAL (ORIF) FIXATION PATELLA;  Surgeon: Donato Heinz, MD;  Location: [facility];  Service: Orthopedics;  Laterality: Left;   PARTIAL HYSTERECTOMY     PATELLAR TENDON REPAIR Left 06/02/2016   Procedure: PATELLA TENDON REPAIR;  Surgeon: Donato Heinz, MD;  Location: [facility];  Service: Orthopedics;  Laterality: Left;   TUBAL LIGATION     UPPER GI ENDOSCOPY  2008   URETEROSCOPY WITH HOLMIUM LASER LITHOTRIPSY Left 05/24/2019   Procedure: URETEROSCOPY WITH HOLMIUM LASER LITHOTRIPSY;  Surgeon: Orson Ape, MD;  Location: [facility];  Service: Urology;  Laterality: Left;   vein closure procedure Bilateral 2010   Patient Active Problem List   Diagnosis Date Noted   COPD (  chronic obstructive pulmonary disease) (HCC) 09/02/2019   History of nephrolithiasis 08/20/2019   Kidney stone 08/20/2019   History of 2019 novel coronavirus disease (COVID-19) 06/25/2019   Hyperlipidemia, mixed 03/08/2019   B12 deficiency 12/19/2017   History of DVT (deep vein thrombosis) 12/19/2017   Medicare annual wellness visit, initial 12/19/2017   Low serum vitamin D 06/27/2017   Varicose veins with pain 12/17/2016   Chronic venous insufficiency 12/17/2016   Trochanteric bursitis 12/17/2016   History of Clostridium difficile colitis 07/19/2016   Left patella fracture 06/03/2016   Family history of colon cancer 12/12/2015   RAD (reactive airway disease), moderate persistent, uncomplicated 11/26/2015   LPRD (laryngopharyngeal reflux disease) 12/22/2013    ONSET DATE: date of  referral 11/07/2023; pt reports intermittent symptoms several years prior   REFERRING DIAG: G31.84 (ICD-10-CM) - Mild cognitive impairment of uncertain or unknown etiology   THERAPY DIAG:  Cognitive communication deficit  Rationale for Evaluation and Treatment Rehabilitation  SUBJECTIVE:   PERTINENT HISTORY: Pt is a 73 year old female that per recent neurology note presents with "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  MRI 04/13/2023 IMPRESSION: 1. No acute intracranial abnormality. 2. NeuroQuant volumetric analysis of the brain -   Briefly, the comparison with age and gender matched reference reveals whole brain volume at the 25th percentile, Hippocampal volume at the 33rd percentile, and thalamic volume below the 25th percentile.  PAIN:  Are you having pain? No   FALLS: Has patient fallen in last 6 months?  No  LIVING ENVIRONMENT: Lives with: lives alone Lives in: House/apartment  PLOF:  Level of assistance: Independent with ADLs, Independent with IADLs Employment: Retired   PATIENT GOALS   to assess and improve memory   SUBJECTIVE STATEMENT: "My sister is coming Saturday"  Pt accompanied by: self OBJECTIVE:   TODAY'S TREATMENT:  Skilled treatment session focused on pt's cognition goals. 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   With pt's permission, this writer spoke via phone with pt's daughter-in-law to gather additional information regarding short-term memory deficits and family concerns. She reports that pt has taken the wrong days' pills, has difficulty remembering if she  ate meals, is verbally repetitive and has a new tendency to by "hyper-focused" on things. Pt's son is working towards use of a pill Dance movement psychotherapist.   This Clinical research associate recommended pt keep a daily schedule/journal to put information such as eating breakfast to help with recall of information past, present and upcoming. SLP provided pt with notebook and skilled verbal as well as written instructions on putting date at the top and filling in appts for the week on each day. Pt voiced understanding as well as took initiative to write down information/notes from the session.   At the end of the session, pt asked, "do I see you again this week." This was ~ 5 minutes after pt had written in her book the appt she has with ST on Thursday. When cued to look in the book, she commented "I don't have it in there I didn't write it in there, Oh look at that you wrote that in there didn't you?" Pt with no recall of having wrote down  the information ~ 5 minutes prior.   PATIENT REPORTED OUTCOME MEASURES (PROM): To be completed over the next 3 sessions     PATIENT EDUCATION: Education details: see above Person educated: Patient Education method: Explanation Education comprehension: needs further education   HOME EXERCISE PROGRAM:   See above   GOALS:  Goals reviewed with patient? Yes  SHORT TERM GOALS: Target date: 10 sessions  With Supervision A, patient will use strategies to improve memory for important information with 90% acc. Independently (ie., white board, daily planner/calendar, Apps on phone).   Baseline: Goal status: INITIAL  2.  Patient will report engagement in cognitive activities outside of ST for 5/7 days.  Baseline: Goal status: INITIAL   LONG TERM GOALS: Target date: 01/05/2024   With Supervision A, patient will use external aids to manage appointments, errands, and household chores consistently between 4 therapy sessions.  Baseline:  Goal status: INITIAL   2.   With Supervision A,  patient will report <2 missed (medications/bills/appointments) over 2 week period with use of external memory aids/strategies.  Baseline:  Goal status: INITIAL  3. With Supervision A, patient will demonstrate knowledge of appropriate activities to support cognitive and  language function outside of ST with assistance from family.   Baseline:  Goal status: INITIAL   ASSESSMENT:  CLINICAL IMPRESSION: Patient is a 73 y.o. 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 Pt presents with mild to moderate cognitive impairment c/b severe deficits in memory specifically delayed recall of information, short term memory and working memory as well as prospective memory.   Pt continues to present with s/s concerning for amnestic cognitive impairment. See the above treatment note for details.   OBJECTIVE IMPAIRMENTS include attention, memory, and awareness. These impairments are limiting patient from managing medications, managing appointments, managing finances, household responsibilities, and ADLs/IADLs. Factors affecting potential to achieve goals and functional outcome are ability to learn/carryover information, medical prognosis, and severity of impairments. Patient will benefit from skilled SLP services to address above impairments and improve overall function.  REHAB POTENTIAL: Good  PLAN: SLP FREQUENCY: 1-2x/week  SLP DURATION: 8 weeks  PLANNED INTERVENTIONS: Environmental controls, Cognitive reorganization, Internal/external aids, Functional tasks, SLP instruction and feedback, Compensatory strategies, and Patient/family education   Japheth Diekman B. Dreama Saa, M.S., CCC-SLP, Tree surgeon Certified Brain Injury Specialist [facility]  [facility] Rehabilitation Services Office [phone] Ascom [phone] Fax [phone]

## 2023-11-17 ENCOUNTER — Ambulatory Visit: Admitting: Speech Pathology

## 2023-11-17 DIAGNOSIS — R41841 Cognitive communication deficit: Secondary | ICD-10-CM

## 2023-11-17 NOTE — Therapy (Signed)
 OUTPATIENT SPEECH LANGUAGE PATHOLOGY  COGNITION TREATMENT NOTE   Patient Name: Darlene Hubbard MRN: [id number] DOB:1951-01-28, 73 y.o., female Today's Date: 11/17/2023  PCP: Bethann Punches, MD REFERRING PROVIDER: Cristopher Peru, MD   End of Session - 11/17/23 0846     Visit Number 3    Number of Visits 17    Date for SLP Re-Evaluation 01/05/24    Authorization Type [employer] [facility] Medicare    Progress Note Due on Visit 10    SLP Start Time 0845    SLP Stop Time  0930    SLP Time Calculation (min) 45 min    Activity Tolerance Patient tolerated treatment well             Past Medical History:  Diagnosis Date   Actinic keratosis 08/06/2015   L med lower leg (bx proven)   Allergy    Anxiety    Asthma    Basal cell carcinoma 08/28/2015   R ant to sideburn zygoma    Basal cell carcinoma 02/16/2023   R nose, EDC   Bursitis    BOTH SHOULDER AND HIPS   COPD (chronic obstructive pulmonary disease) (HCC)    COVID-19 05/2019   Dysplastic nevus 02/28/2019   R upper back paraspinal - mod   Dysplastic nevus 03/17/2020   L lat mid lat tricep - mod   Fibromyalgia    GERD (gastroesophageal reflux disease)    Hiatal hernia    History of kidney stones    IBS (irritable bowel syndrome)    Mild obstructive sleep apnea    does not use Cpap   Morbid obesity (HCC)    Pneumonia    Spinal stenosis    Varicose veins    Past Surgical History:  Procedure Laterality Date   ABDOMINAL HYSTERECTOMY  1993   APPENDECTOMY  1995   BACK SURGERY     BREAST BIOPSY Left 2000   neg   CHOLECYSTECTOMY     COLONOSCOPY  2008   COLONOSCOPY WITH PROPOFOL N/A 02/18/2016   Procedure: COLONOSCOPY WITH PROPOFOL;  Surgeon: Earline Mayotte, MD;  Location: [facility];  Service: Endoscopy;  Laterality: N/A;   CYSTOSCOPY W/ RETROGRADES Left 05/24/2019   Procedure: CYSTOSCOPY WITH RETROGRADE PYELOGRAM;  Surgeon: Orson Ape, MD;  Location: [facility];  Service: Urology;  Laterality: Left;    dental work     EXTRACORPOREAL SHOCK WAVE LITHOTRIPSY Right 06/07/2019   Procedure: EXTRACORPOREAL SHOCK WAVE LITHOTRIPSY (ESWL);  Surgeon: Orson Ape, MD;  Location: [facility];  Service: Urology;  Laterality: Right;   HARDWARE REMOVAL Left 01/19/2021   Procedure: HARDWARE REMOVAL;  Surgeon: Donato Heinz, MD;  Location: [facility];  Service: Orthopedics;  Laterality: Left;   ORIF PATELLA Left 06/02/2016   Procedure: OPEN REDUCTION INTERNAL (ORIF) FIXATION PATELLA;  Surgeon: Donato Heinz, MD;  Location: [facility];  Service: Orthopedics;  Laterality: Left;   PARTIAL HYSTERECTOMY     PATELLAR TENDON REPAIR Left 06/02/2016   Procedure: PATELLA TENDON REPAIR;  Surgeon: Donato Heinz, MD;  Location: [facility];  Service: Orthopedics;  Laterality: Left;   TUBAL LIGATION     UPPER GI ENDOSCOPY  2008   URETEROSCOPY WITH HOLMIUM LASER LITHOTRIPSY Left 05/24/2019   Procedure: URETEROSCOPY WITH HOLMIUM LASER LITHOTRIPSY;  Surgeon: Orson Ape, MD;  Location: [facility];  Service: Urology;  Laterality: Left;   vein closure procedure Bilateral 2010   Patient Active Problem List   Diagnosis Date Noted   COPD (  chronic obstructive pulmonary disease) (HCC) 09/02/2019   History of nephrolithiasis 08/20/2019   Kidney stone 08/20/2019   History of 2019 novel coronavirus disease (COVID-19) 06/25/2019   Hyperlipidemia, mixed 03/08/2019   B12 deficiency 12/19/2017   History of DVT (deep vein thrombosis) 12/19/2017   Medicare annual wellness visit, initial 12/19/2017   Low serum vitamin D 06/27/2017   Varicose veins with pain 12/17/2016   Chronic venous insufficiency 12/17/2016   Trochanteric bursitis 12/17/2016   History of Clostridium difficile colitis 07/19/2016   Left patella fracture 06/03/2016   Family history of colon cancer 12/12/2015   RAD (reactive airway disease), moderate persistent, uncomplicated 11/26/2015   LPRD (laryngopharyngeal reflux disease) 12/22/2013    ONSET DATE: date of  referral 11/07/2023; pt reports intermittent symptoms several years prior   REFERRING DIAG: G31.84 (ICD-10-CM) - Mild cognitive impairment of uncertain or unknown etiology   THERAPY DIAG:  Cognitive communication deficit  Rationale for Evaluation and Treatment Rehabilitation  SUBJECTIVE:   PERTINENT HISTORY: Pt is a 73 year old female that per recent neurology note presents with "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  MRI 04/13/2023 IMPRESSION: 1. No acute intracranial abnormality. 2. NeuroQuant volumetric analysis of the brain -   Briefly, the comparison with age and gender matched reference reveals whole brain volume at the 25th percentile, Hippocampal volume at the 33rd percentile, and thalamic volume below the 25th percentile.  PAIN:  Are you having pain? No   FALLS: Has patient fallen in last 6 months?  No  LIVING ENVIRONMENT: Lives with: lives alone Lives in: House/apartment  PLOF:  Level of assistance: Independent with ADLs, Independent with IADLs Employment: Retired   PATIENT GOALS   to assess and improve memory   SUBJECTIVE STATEMENT: "My sister is not able to come this week, but she is coming next week" Pt accompanied by: self OBJECTIVE:   TODAY'S TREATMENT:  Skilled treatment session focused on pt's cognition goals. SLP facilitated session by providing the following interventions:  Pt brought in her memory book with good information written within it. Pt with improved ability to refer to information as well throughout the session to answer questions. With minimal use of book, she was able to recall orientation information which is an improvement. When looking thru her purse of a pen, it was  observed that her purse was very disorganized containing multiple water bottles etc. Education provided on creating a list of organizational activities and benefits of having an organized surface area (purse).    PATIENT EDUCATION: Education details: see above Person educated: Patient Education method: Explanation Education comprehension: needs further education   HOME EXERCISE PROGRAM:   See above   GOALS:  Goals reviewed with patient? Yes  SHORT TERM GOALS: Target date: 10 sessions  With Supervision A, patient will use strategies to improve memory for important information with 90% acc. Independently (ie., white board, daily planner/calendar, Apps on phone).   Baseline: Goal status: INITIAL  2.  Patient will report engagement in cognitive activities outside of ST for 5/7 days.  Baseline: Goal status: INITIAL   LONG TERM GOALS: Target date: 01/05/2024   With Supervision A, patient will use external aids to manage appointments, errands, and household chores consistently between 4 therapy sessions.  Baseline:  Goal status: INITIAL   2.  With Supervision A, patient will report <2 missed (medications/bills/appointments) over 2 week period with use of external memory aids/strategies.  Baseline:  Goal status: INITIAL  3. With Supervision A, patient will demonstrate knowledge of appropriate activities to support cognitive and  language function outside of ST with assistance from family.   Baseline:  Goal status: INITIAL   ASSESSMENT:  CLINICAL IMPRESSION: Patient is a 73 y.o. 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 Pt presents with mild to moderate cognitive impairment c/b severe deficits in memory specifically delayed recall of information, short term memory and working memory as well as prospective memory.   Pt continues to be eager to implement recommendations/strategies  to help with her amnestic cognitive impairment. See the above treatment note for details.   OBJECTIVE IMPAIRMENTS include attention, memory, and awareness. These impairments are limiting patient from managing medications, managing appointments, managing finances, household responsibilities, and ADLs/IADLs. Factors affecting potential to achieve goals and functional outcome are ability to learn/carryover information, medical prognosis, and severity of impairments. Patient will benefit from skilled SLP services to address above impairments and improve overall function.  REHAB POTENTIAL: Good  PLAN: SLP FREQUENCY: 1-2x/week  SLP DURATION: 8 weeks  PLANNED INTERVENTIONS: Environmental controls, Cognitive reorganization, Internal/external aids, Functional tasks, SLP instruction and feedback, Compensatory strategies, and Patient/family education   Magin Balbi B. Dreama Saa, M.S., CCC-SLP, Tree surgeon Certified Brain Injury Specialist [facility]  [facility] Services Office [phone] Ascom [phone] Fax [phone]

## 2023-11-21 ENCOUNTER — Ambulatory Visit: Admitting: Speech Pathology

## 2023-11-22 ENCOUNTER — Ambulatory Visit: Admitting: Speech Pathology

## 2023-11-22 DIAGNOSIS — R41841 Cognitive communication deficit: Secondary | ICD-10-CM | POA: Diagnosis not present

## 2023-11-22 NOTE — Therapy (Signed)
 OUTPATIENT SPEECH LANGUAGE PATHOLOGY  COGNITION TREATMENT NOTE   Patient Name: Darlene Hubbard MRN: [id number] DOB:May 09, 1951, 73 y.o., female Today's Date: 11/22/2023  PCP: Bethann Punches, MD REFERRING PROVIDER: Cristopher Peru, MD   End of Session - 11/22/23 1106     Visit Number 4    Number of Visits 17    Date for SLP Re-Evaluation 01/05/24    Authorization Type [employer] [facility] Medicare    Progress Note Due on Visit 10    SLP Start Time 1100    SLP Stop Time  1145    SLP Time Calculation (min) 45 min    Activity Tolerance Patient tolerated treatment well             Past Medical History:  Diagnosis Date   Actinic keratosis 08/06/2015   L med lower leg (bx proven)   Allergy    Anxiety    Asthma    Basal cell carcinoma 08/28/2015   R ant to sideburn zygoma    Basal cell carcinoma 02/16/2023   R nose, EDC   Bursitis    BOTH SHOULDER AND HIPS   COPD (chronic obstructive pulmonary disease) (HCC)    COVID-19 05/2019   Dysplastic nevus 02/28/2019   R upper back paraspinal - mod   Dysplastic nevus 03/17/2020   L lat mid lat tricep - mod   Fibromyalgia    GERD (gastroesophageal reflux disease)    Hiatal hernia    History of kidney stones    IBS (irritable bowel syndrome)    Mild obstructive sleep apnea    does not use Cpap   Morbid obesity (HCC)    Pneumonia    Spinal stenosis    Varicose veins    Past Surgical History:  Procedure Laterality Date   ABDOMINAL HYSTERECTOMY  1993   APPENDECTOMY  1995   BACK SURGERY     BREAST BIOPSY Left 2000   neg   CHOLECYSTECTOMY     COLONOSCOPY  2008   COLONOSCOPY WITH PROPOFOL N/A 02/18/2016   Procedure: COLONOSCOPY WITH PROPOFOL;  Surgeon: Earline Mayotte, MD;  Location: [facility];  Service: Endoscopy;  Laterality: N/A;   CYSTOSCOPY W/ RETROGRADES Left 05/24/2019   Procedure: CYSTOSCOPY WITH RETROGRADE PYELOGRAM;  Surgeon: Orson Ape, MD;  Location: [facility];  Service: Urology;  Laterality: Left;    dental work     EXTRACORPOREAL SHOCK WAVE LITHOTRIPSY Right 06/07/2019   Procedure: EXTRACORPOREAL SHOCK WAVE LITHOTRIPSY (ESWL);  Surgeon: Orson Ape, MD;  Location: [facility];  Service: Urology;  Laterality: Right;   HARDWARE REMOVAL Left 01/19/2021   Procedure: HARDWARE REMOVAL;  Surgeon: Donato Heinz, MD;  Location: [facility];  Service: Orthopedics;  Laterality: Left;   ORIF PATELLA Left 06/02/2016   Procedure: OPEN REDUCTION INTERNAL (ORIF) FIXATION PATELLA;  Surgeon: Donato Heinz, MD;  Location: [facility];  Service: Orthopedics;  Laterality: Left;   PARTIAL HYSTERECTOMY     PATELLAR TENDON REPAIR Left 06/02/2016   Procedure: PATELLA TENDON REPAIR;  Surgeon: Donato Heinz, MD;  Location: [facility];  Service: Orthopedics;  Laterality: Left;   TUBAL LIGATION     UPPER GI ENDOSCOPY  2008   URETEROSCOPY WITH HOLMIUM LASER LITHOTRIPSY Left 05/24/2019   Procedure: URETEROSCOPY WITH HOLMIUM LASER LITHOTRIPSY;  Surgeon: Orson Ape, MD;  Location: [facility];  Service: Urology;  Laterality: Left;   vein closure procedure Bilateral 2010   Patient Active Problem List   Diagnosis Date Noted   COPD (  chronic obstructive pulmonary disease) (HCC) 09/02/2019   History of nephrolithiasis 08/20/2019   Kidney stone 08/20/2019   History of 2019 novel coronavirus disease (COVID-19) 06/25/2019   Hyperlipidemia, mixed 03/08/2019   B12 deficiency 12/19/2017   History of DVT (deep vein thrombosis) 12/19/2017   Medicare annual wellness visit, initial 12/19/2017   Low serum vitamin D 06/27/2017   Varicose veins with pain 12/17/2016   Chronic venous insufficiency 12/17/2016   Trochanteric bursitis 12/17/2016   History of Clostridium difficile colitis 07/19/2016   Left patella fracture 06/03/2016   Family history of colon cancer 12/12/2015   RAD (reactive airway disease), moderate persistent, uncomplicated 11/26/2015   LPRD (laryngopharyngeal reflux disease) 12/22/2013    ONSET DATE: date of  referral 11/07/2023; pt reports intermittent symptoms several years prior   REFERRING DIAG: G31.84 (ICD-10-CM) - Mild cognitive impairment of uncertain or unknown etiology   THERAPY DIAG:  Cognitive communication deficit  Rationale for Evaluation and Treatment Rehabilitation  SUBJECTIVE:   PERTINENT HISTORY: Pt is a 73 year old female that per recent neurology note presents with "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  MRI 04/13/2023 IMPRESSION: 1. No acute intracranial abnormality. 2. NeuroQuant volumetric analysis of the brain -   Briefly, the comparison with age and gender matched reference reveals whole brain volume at the 25th percentile, Hippocampal volume at the 33rd percentile, and thalamic volume below the 25th percentile.  PAIN:  Are you having pain? No   FALLS: Has patient fallen in last 6 months?  No  LIVING ENVIRONMENT: Lives with: lives alone Lives in: House/apartment  PLOF:  Level of assistance: Independent with ADLs, Independent with IADLs Employment: Retired   PATIENT GOALS   to assess and improve memory   SUBJECTIVE STATEMENT: "My allergies are bothering me today, I woke up just not feeling well, I am no sick, just a lot going on all at once" Pt accompanied by: self   OBJECTIVE:   TODAY'S TREATMENT:  Skilled treatment session focused on pt's cognition goals. SLP facilitated session by providing the following interventions:  Pt arrived to session with increased disorganization over previous sessions, she reports having a lot going on - "It just seems like my time is not my own anymore, it just seems like there is a lot going on right now - I am doing taxes, my sister is coming, I am  having gum surgery, I have an echocardiogram after this appointment and I need to make an appt with Dr Hyacinth Meeker." Pt reports forgetting her notebook and is not able to report if she has used it as compensatory memory strategy. She speaks with fast rate and is more repetitive of information today than previous sessions. She also needs moderate cues to read her calendar containing therapy sessions.   PATIENT EDUCATION: Education details: see above Person educated: Patient Education method: Explanation Education comprehension: needs further education   HOME EXERCISE PROGRAM:   See above   GOALS:  Goals reviewed with patient? Yes  SHORT TERM GOALS: Target date: 10 sessions  With Supervision A, patient will use strategies to improve memory for important information with 90% acc. Independently (ie., white board, daily planner/calendar, Apps on phone).   Baseline: Goal status: INITIAL  2.  Patient will report engagement in cognitive activities outside of ST for  5/7 days.  Baseline: Goal status: INITIAL   LONG TERM GOALS: Target date: 01/05/2024   With Supervision A, patient will use external aids to manage appointments, errands, and household chores consistently between 4 therapy sessions.  Baseline:  Goal status: INITIAL   2.   With Supervision A, patient will report <2 missed (medications/bills/appointments) over 2 week period with use of external memory aids/strategies.  Baseline:  Goal status: INITIAL  3. With Supervision A, patient will demonstrate knowledge of appropriate activities to support cognitive and  language function outside of ST with assistance from family.   Baseline:  Goal status: INITIAL   ASSESSMENT:  CLINICAL IMPRESSION: Patient is a 73 y.o. 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 Pt presents with mild to moderate cognitive impairment c/b severe deficits in  memory specifically delayed recall of information, short term memory and working memory as well as prospective memory.   Pt continues to be eager to implement recommendations/strategies to help with her amnestic cognitive impairment. See the above treatment note for details.   OBJECTIVE IMPAIRMENTS include attention, memory, and awareness. These impairments are limiting patient from managing medications, managing appointments, managing finances, household responsibilities, and ADLs/IADLs. Factors affecting potential to achieve goals and functional outcome are ability to learn/carryover information, medical prognosis, and severity of impairments. Patient will benefit from skilled SLP services to address above impairments and improve overall function.  REHAB POTENTIAL: Good  PLAN: SLP FREQUENCY: 1-2x/week  SLP DURATION: 8 weeks  PLANNED INTERVENTIONS: Environmental controls, Cognitive reorganization, Internal/external aids, Functional tasks, SLP instruction and feedback, Compensatory strategies, and Patient/family education   Suad Autrey B. Dreama Saa, M.S., CCC-SLP, Tree surgeon Certified Brain Injury Specialist [facility]  [facility] Rehabilitation Services Office [phone] Ascom [phone] Fax [phone]

## 2023-11-23 ENCOUNTER — Encounter: Admitting: Speech Pathology

## 2023-11-24 ENCOUNTER — Ambulatory Visit: Admitting: Speech Pathology

## 2023-11-29 ENCOUNTER — Ambulatory Visit: Attending: Neurology | Admitting: Speech Pathology

## 2023-11-29 DIAGNOSIS — R41841 Cognitive communication deficit: Secondary | ICD-10-CM | POA: Diagnosis present

## 2023-11-29 NOTE — Therapy (Unsigned)
 OUTPATIENT SPEECH LANGUAGE PATHOLOGY  COGNITION TREATMENT NOTE   Patient Name: Darlene Hubbard MRN: [id number] DOB:05/02/1951, 73 y.o., female Today's Date: 11/29/2023  PCP: Bethann Punches, MD REFERRING PROVIDER: Cristopher Peru, MD   End of Session - 11/29/23 1015     Visit Number 5    Number of Visits 17    Date for SLP Re-Evaluation 01/05/24    Authorization Type [employer] [facility] Medicare    Progress Note Due on Visit 10    Activity Tolerance Patient tolerated treatment well             Past Medical History:  Diagnosis Date   Actinic keratosis 08/06/2015   L med lower leg (bx proven)   Allergy    Anxiety    Asthma    Basal cell carcinoma 08/28/2015   R ant to sideburn zygoma    Basal cell carcinoma 02/16/2023   R nose, EDC   Bursitis    BOTH SHOULDER AND HIPS   COPD (chronic obstructive pulmonary disease) (HCC)    COVID-19 05/2019   Dysplastic nevus 02/28/2019   R upper back paraspinal - mod   Dysplastic nevus 03/17/2020   L lat mid lat tricep - mod   Fibromyalgia    GERD (gastroesophageal reflux disease)    Hiatal hernia    History of kidney stones    IBS (irritable bowel syndrome)    Mild obstructive sleep apnea    does not use Cpap   Morbid obesity (HCC)    Pneumonia    Spinal stenosis    Varicose veins    Past Surgical History:  Procedure Laterality Date   ABDOMINAL HYSTERECTOMY  1993   APPENDECTOMY  1995   BACK SURGERY     BREAST BIOPSY Left 2000   neg   CHOLECYSTECTOMY     COLONOSCOPY  2008   COLONOSCOPY WITH PROPOFOL N/A 02/18/2016   Procedure: COLONOSCOPY WITH PROPOFOL;  Surgeon: Earline Mayotte, MD;  Location: [facility];  Service: Endoscopy;  Laterality: N/A;   CYSTOSCOPY W/ RETROGRADES Left 05/24/2019   Procedure: CYSTOSCOPY WITH RETROGRADE PYELOGRAM;  Surgeon: Orson Ape, MD;  Location: [facility];  Service: Urology;  Laterality: Left;   dental work     EXTRACORPOREAL SHOCK WAVE LITHOTRIPSY Right 06/07/2019   Procedure:  EXTRACORPOREAL SHOCK WAVE LITHOTRIPSY (ESWL);  Surgeon: Orson Ape, MD;  Location: [facility];  Service: Urology;  Laterality: Right;   HARDWARE REMOVAL Left 01/19/2021   Procedure: HARDWARE REMOVAL;  Surgeon: Donato Heinz, MD;  Location: [facility];  Service: Orthopedics;  Laterality: Left;   ORIF PATELLA Left 06/02/2016   Procedure: OPEN REDUCTION INTERNAL (ORIF) FIXATION PATELLA;  Surgeon: Donato Heinz, MD;  Location: [facility];  Service: Orthopedics;  Laterality: Left;   PARTIAL HYSTERECTOMY     PATELLAR TENDON REPAIR Left 06/02/2016   Procedure: PATELLA TENDON REPAIR;  Surgeon: Donato Heinz, MD;  Location: [facility];  Service: Orthopedics;  Laterality: Left;   TUBAL LIGATION     UPPER GI ENDOSCOPY  2008   URETEROSCOPY WITH HOLMIUM LASER LITHOTRIPSY Left 05/24/2019   Procedure: URETEROSCOPY WITH HOLMIUM LASER LITHOTRIPSY;  Surgeon: Orson Ape, MD;  Location: [facility];  Service: Urology;  Laterality: Left;   vein closure procedure Bilateral 2010   Patient Active Problem List   Diagnosis Date Noted   COPD (chronic obstructive pulmonary disease) (HCC) 09/02/2019   History of nephrolithiasis 08/20/2019   Kidney stone 08/20/2019   History of 2019 novel coronavirus  disease (COVID-19) 06/25/2019   Hyperlipidemia, mixed 03/08/2019   B12 deficiency 12/19/2017   History of DVT (deep vein thrombosis) 12/19/2017   Medicare annual wellness visit, initial 12/19/2017   Low serum vitamin D 06/27/2017   Varicose veins with pain 12/17/2016   Chronic venous insufficiency 12/17/2016   Trochanteric bursitis 12/17/2016   History of Clostridium difficile colitis 07/19/2016   Left patella fracture 06/03/2016   Family history of colon cancer 12/12/2015   RAD (reactive airway disease), moderate persistent, uncomplicated 11/26/2015   LPRD (laryngopharyngeal reflux disease) 12/22/2013    ONSET DATE: date of referral 11/07/2023; pt reports intermittent symptoms several years prior   REFERRING  DIAG: G31.84 (ICD-10-CM) - Mild cognitive impairment of uncertain or unknown etiology   THERAPY DIAG:  Cognitive communication deficit  Rationale for Evaluation and Treatment Rehabilitation  SUBJECTIVE:   PERTINENT HISTORY: Pt is a 73 year old female that per recent neurology note presents with "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  MRI 04/13/2023 IMPRESSION: 1. No acute intracranial abnormality. 2. NeuroQuant volumetric analysis of the brain -   Briefly, the comparison with age and gender matched reference reveals whole brain volume at the 25th percentile, Hippocampal volume at the 33rd percentile, and thalamic volume below the 25th percentile.  PAIN:  Are you having pain? No   FALLS: Has patient fallen in last 6 months?  No  LIVING ENVIRONMENT: Lives with: lives alone Lives in: House/apartment  PLOF:  Level of assistance: Independent with ADLs, Independent with IADLs Employment: Retired   PATIENT GOALS   to assess and improve memory   SUBJECTIVE STATEMENT: Pt says that she is feeling better, dreads surgery tomorrow on her gums Pt accompanied by: self   OBJECTIVE:   TODAY'S TREATMENT:  Skilled treatment session focused on pt's cognition goals. SLP facilitated session by providing the following interventions:  Pt brought in her memory book with good information contained within it. Reports that "it is going well." She doesn't appear as stressed as previous session. She also reports that her son assisted her with medications this morning and she forgot which medicine needed to be refilled. Recommend pt write this information in her memory book. Establish one place for all notes, written reminders.    Additional information written in her memory book with sticky note put on top of book to remind her to ask DIL to attend next session. Pt with no recall of putting sticky note there less than 1 minute of time lapsed.   PATIENT EDUCATION: Education details: see above Person educated: Patient Education method: Explanation Education comprehension: needs further education   HOME EXERCISE PROGRAM:   See above   GOALS:  Goals reviewed with patient? Yes  SHORT TERM GOALS: Target date: 10 sessions  With Supervision A, patient will use strategies to improve memory for important information with 90% acc. Independently (ie., white board, daily planner/calendar, Apps on phone).   Baseline: Goal status: INITIAL  2.  Patient will report engagement in cognitive activities outside of ST for 5/7 days.  Baseline: Goal status: INITIAL   LONG TERM GOALS: Target date: 01/05/2024   With Supervision A, patient will use external aids to manage appointments, errands, and household chores consistently between 4 therapy sessions.  Baseline:  Goal status: INITIAL   2.  With Supervision A, patient will report <2 missed (medications/bills/appointments) over 2 week period with use of external memory aids/strategies.  Baseline:  Goal status: INITIAL  3. With Supervision A, patient will demonstrate knowledge of appropriate activities to support cognitive and  language function outside of ST with assistance from family.   Baseline:  Goal status: INITIAL   ASSESSMENT:  CLINICAL IMPRESSION: Patient is a 73 y.o. 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 Pt presents with mild to moderate cognitive impairment c/b severe deficits in memory specifically delayed recall of information, short term memory and working memory as well as prospective memory.   Pt continues to be eager to implement  recommendations/strategies to help with her amnestic cognitive impairment. Short term memory deficits continue to be significant. See the above treatment note for details.   OBJECTIVE IMPAIRMENTS include attention, memory, and awareness. These impairments are limiting patient from managing medications, managing appointments, managing finances, household responsibilities, and ADLs/IADLs. Factors affecting potential to achieve goals and functional outcome are ability to learn/carryover information, medical prognosis, and severity of impairments. Patient will benefit from skilled SLP services to address above impairments and improve overall function.  REHAB POTENTIAL: Good  PLAN: SLP FREQUENCY: 1-2x/week  SLP DURATION: 8 weeks  PLANNED INTERVENTIONS: Environmental controls, Cognitive reorganization, Internal/external aids, Functional tasks, SLP instruction and feedback, Compensatory strategies, and Patient/family education   Hanan Moen B. Dreama Saa, M.S., CCC-SLP, Tree surgeon Certified Brain Injury Specialist [facility]  [facility] Rehabilitation Services Office [phone] Ascom [phone] Fax [phone]

## 2023-12-02 ENCOUNTER — Encounter: Admitting: Speech Pathology

## 2023-12-06 ENCOUNTER — Encounter: Admitting: Speech Pathology

## 2023-12-06 ENCOUNTER — Ambulatory Visit: Admitting: Speech Pathology

## 2023-12-06 DIAGNOSIS — R41841 Cognitive communication deficit: Secondary | ICD-10-CM | POA: Diagnosis not present

## 2023-12-06 NOTE — Therapy (Signed)
 OUTPATIENT SPEECH LANGUAGE PATHOLOGY  COGNITION TREATMENT NOTE   Patient Name: Darlene Hubbard MRN: [id number] DOB:1951-01-13, 73 y.o., female Today's Date: 12/06/2023  PCP: Bethann Punches, MD REFERRING PROVIDER: Cristopher Peru, MD   End of Session - 12/06/23 1110     Visit Number 6    Number of Visits 17    Date for SLP Re-Evaluation 01/05/24    Authorization Type [employer] [facility] Medicare    Progress Note Due on Visit 10    SLP Start Time 1125    SLP Stop Time  1200    SLP Time Calculation (min) 35 min    Activity Tolerance Patient tolerated treatment well             Past Medical History:  Diagnosis Date   Actinic keratosis 08/06/2015   L med lower leg (bx proven)   Allergy    Anxiety    Asthma    Basal cell carcinoma 08/28/2015   R ant to sideburn zygoma    Basal cell carcinoma 02/16/2023   R nose, EDC   Bursitis    BOTH SHOULDER AND HIPS   COPD (chronic obstructive pulmonary disease) (HCC)    COVID-19 05/2019   Dysplastic nevus 02/28/2019   R upper back paraspinal - mod   Dysplastic nevus 03/17/2020   L lat mid lat tricep - mod   Fibromyalgia    GERD (gastroesophageal reflux disease)    Hiatal hernia    History of kidney stones    IBS (irritable bowel syndrome)    Mild obstructive sleep apnea    does not use Cpap   Morbid obesity (HCC)    Pneumonia    Spinal stenosis    Varicose veins    Past Surgical History:  Procedure Laterality Date   ABDOMINAL HYSTERECTOMY  1993   APPENDECTOMY  1995   BACK SURGERY     BREAST BIOPSY Left 2000   neg   CHOLECYSTECTOMY     COLONOSCOPY  2008   COLONOSCOPY WITH PROPOFOL N/A 02/18/2016   Procedure: COLONOSCOPY WITH PROPOFOL;  Surgeon: Earline Mayotte, MD;  Location: [facility];  Service: Endoscopy;  Laterality: N/A;   CYSTOSCOPY W/ RETROGRADES Left 05/24/2019   Procedure: CYSTOSCOPY WITH RETROGRADE PYELOGRAM;  Surgeon: Orson Ape, MD;  Location: [facility];  Service: Urology;  Laterality: Left;    dental work     EXTRACORPOREAL SHOCK WAVE LITHOTRIPSY Right 06/07/2019   Procedure: EXTRACORPOREAL SHOCK WAVE LITHOTRIPSY (ESWL);  Surgeon: Orson Ape, MD;  Location: [facility];  Service: Urology;  Laterality: Right;   HARDWARE REMOVAL Left 01/19/2021   Procedure: HARDWARE REMOVAL;  Surgeon: Donato Heinz, MD;  Location: [facility];  Service: Orthopedics;  Laterality: Left;   ORIF PATELLA Left 06/02/2016   Procedure: OPEN REDUCTION INTERNAL (ORIF) FIXATION PATELLA;  Surgeon: Donato Heinz, MD;  Location: [facility];  Service: Orthopedics;  Laterality: Left;   PARTIAL HYSTERECTOMY     PATELLAR TENDON REPAIR Left 06/02/2016   Procedure: PATELLA TENDON REPAIR;  Surgeon: Donato Heinz, MD;  Location: [facility];  Service: Orthopedics;  Laterality: Left;   TUBAL LIGATION     UPPER GI ENDOSCOPY  2008   URETEROSCOPY WITH HOLMIUM LASER LITHOTRIPSY Left 05/24/2019   Procedure: URETEROSCOPY WITH HOLMIUM LASER LITHOTRIPSY;  Surgeon: Orson Ape, MD;  Location: [facility];  Service: Urology;  Laterality: Left;   vein closure procedure Bilateral 2010   Patient Active Problem List   Diagnosis Date Noted   COPD (  chronic obstructive pulmonary disease) (HCC) 09/02/2019   History of nephrolithiasis 08/20/2019   Kidney stone 08/20/2019   History of 2019 novel coronavirus disease (COVID-19) 06/25/2019   Hyperlipidemia, mixed 03/08/2019   B12 deficiency 12/19/2017   History of DVT (deep vein thrombosis) 12/19/2017   Medicare annual wellness visit, initial 12/19/2017   Low serum vitamin D 06/27/2017   Varicose veins with pain 12/17/2016   Chronic venous insufficiency 12/17/2016   Trochanteric bursitis 12/17/2016   History of Clostridium difficile colitis 07/19/2016   Left patella fracture 06/03/2016   Family history of colon cancer 12/12/2015   RAD (reactive airway disease), moderate persistent, uncomplicated 11/26/2015   LPRD (laryngopharyngeal reflux disease) 12/22/2013    ONSET DATE: date of  referral 11/07/2023; pt reports intermittent symptoms several years prior   REFERRING DIAG: G31.84 (ICD-10-CM) - Mild cognitive impairment of uncertain or unknown etiology   THERAPY DIAG:  Cognitive communication deficit  Rationale for Evaluation and Treatment Rehabilitation  SUBJECTIVE:   PERTINENT HISTORY: Pt is a 73 year old female that per recent neurology note presents with "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  MRI 04/13/2023 IMPRESSION: 1. No acute intracranial abnormality. 2. NeuroQuant volumetric analysis of the brain -   Briefly, the comparison with age and gender matched reference reveals whole brain volume at the 25th percentile, Hippocampal volume at the 33rd percentile, and thalamic volume below the 25th percentile.  PAIN:  Are you having pain? No   FALLS: Has patient fallen in last 6 months?  No  LIVING ENVIRONMENT: Lives with: lives alone Lives in: House/apartment  PLOF:  Level of assistance: Independent with ADLs, Independent with IADLs Employment: Retired   PATIENT GOALS   to assess and improve memory   SUBJECTIVE STATEMENT: Pt reports improvement in recovery from surgery on gums Pt accompanied by: self   OBJECTIVE:   TODAY'S TREATMENT:  Skilled treatment session focused on pt's cognition goals. SLP facilitated session by providing the following interventions:  Pt asked for another copy of her therapy calendar. By pt's report, she is improving in her use of compensatory memory strategies. She reports that her daughter is still out of town and unable to attend treatment sessions right. She reports that she is aware of potential to "hyperfocus" and states that last night she looked at  her calendar "probably 4 times" to look at the time for today's appt - suspect this is related to inability to recall information < 5 minutes after viewing it - however, today she is able to recall of the time of her appt this afternoon.    PATIENT EDUCATION: Education details: see above Person educated: Patient Education method: Explanation Education comprehension: needs further education   HOME EXERCISE PROGRAM:   See above   GOALS:  Goals reviewed with patient? Yes  SHORT TERM GOALS: Target date: 10 sessions  With Supervision A, patient will use strategies to improve memory for important information with 90% acc. Independently (ie., white board, daily planner/calendar, Apps on phone).   Baseline: Goal status: INITIAL  2.  Patient will report engagement in cognitive activities outside of ST for 5/7 days.  Baseline: Goal status: INITIAL   LONG TERM GOALS: Target date: 01/05/2024   With Supervision A, patient will use external aids to manage appointments, errands, and household chores  consistently between 4 therapy sessions.  Baseline:  Goal status: INITIAL   2.   With Supervision A, patient will report <2 missed (medications/bills/appointments) over 2 week period with use of external memory aids/strategies.  Baseline:  Goal status: INITIAL  3. With Supervision A, patient will demonstrate knowledge of appropriate activities to support cognitive and  language function outside of ST with assistance from family.   Baseline:  Goal status: INITIAL   ASSESSMENT:  CLINICAL IMPRESSION: Patient is a 73 y.o. 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 Pt presents with mild to moderate cognitive impairment c/b severe deficits in memory specifically delayed recall of information, short term memory and working memory as well as prospective memory.   Pt continues to be eager to implement  recommendations/strategies to help with her amnestic cognitive impairment. Short term memory deficits continue to be significant. See the above treatment note for details.   OBJECTIVE IMPAIRMENTS include attention, memory, and awareness. These impairments are limiting patient from managing medications, managing appointments, managing finances, household responsibilities, and ADLs/IADLs. Factors affecting potential to achieve goals and functional outcome are ability to learn/carryover information, medical prognosis, and severity of impairments. Patient will benefit from skilled SLP services to address above impairments and improve overall function.  REHAB POTENTIAL: Good  PLAN: SLP FREQUENCY: 1-2x/week  SLP DURATION: 8 weeks  PLANNED INTERVENTIONS: Environmental controls, Cognitive reorganization, Internal/external aids, Functional tasks, SLP instruction and feedback, Compensatory strategies, and Patient/family education   Burnis Halling B. Dreama Saa, M.S., CCC-SLP, Tree surgeon Certified Brain Injury Specialist [facility]  [facility] Rehabilitation Services Office [phone] Ascom [phone] Fax [phone]

## 2023-12-08 ENCOUNTER — Ambulatory Visit: Admitting: Speech Pathology

## 2023-12-13 ENCOUNTER — Ambulatory Visit: Admitting: Speech Pathology

## 2023-12-13 DIAGNOSIS — R41841 Cognitive communication deficit: Secondary | ICD-10-CM

## 2023-12-13 NOTE — Therapy (Signed)
 OUTPATIENT SPEECH LANGUAGE PATHOLOGY  COGNITION TREATMENT NOTE   Patient Name: Darlene Hubbard MRN: [id number] DOB:1951-04-01, 73 y.o., female Today's Date: 12/13/2023  PCP: Firman Hughes, MD REFERRING PROVIDER: Devora Folks, MD   End of Session - 12/13/23 1012     Visit Number 7    Number of Visits 17    Date for SLP Re-Evaluation 01/05/24    Authorization Type [employer] [facility] Medicare    Progress Note Due on Visit 10    SLP Start Time 1015    SLP Stop Time  1100    SLP Time Calculation (min) 45 min             Past Medical History:  Diagnosis Date   Actinic keratosis 08/06/2015   L med lower leg (bx proven)   Allergy    Anxiety    Asthma    Basal cell carcinoma 08/28/2015   R ant to sideburn zygoma    Basal cell carcinoma 02/16/2023   R nose, EDC   Bursitis    BOTH SHOULDER AND HIPS   COPD (chronic obstructive pulmonary disease) (HCC)    COVID-19 05/2019   Dysplastic nevus 02/28/2019   R upper back paraspinal - mod   Dysplastic nevus 03/17/2020   L lat mid lat tricep - mod   Fibromyalgia    GERD (gastroesophageal reflux disease)    Hiatal hernia    History of kidney stones    IBS (irritable bowel syndrome)    Mild obstructive sleep apnea    does not use Cpap   Morbid obesity (HCC)    Pneumonia    Spinal stenosis    Varicose veins    Past Surgical History:  Procedure Laterality Date   ABDOMINAL HYSTERECTOMY  1993   APPENDECTOMY  1995   BACK SURGERY     BREAST BIOPSY Left 2000   neg   CHOLECYSTECTOMY     COLONOSCOPY  2008   COLONOSCOPY WITH PROPOFOL N/A 02/18/2016   Procedure: COLONOSCOPY WITH PROPOFOL;  Surgeon: Marshall Skeeter, MD;  Location: [facility];  Service: Endoscopy;  Laterality: N/A;   CYSTOSCOPY W/ RETROGRADES Left 05/24/2019   Procedure: CYSTOSCOPY WITH RETROGRADE PYELOGRAM;  Surgeon: Rea Cambridge, MD;  Location: [facility];  Service: Urology;  Laterality: Left;   dental work     EXTRACORPOREAL SHOCK WAVE LITHOTRIPSY Right  06/07/2019   Procedure: EXTRACORPOREAL SHOCK WAVE LITHOTRIPSY (ESWL);  Surgeon: Rea Cambridge, MD;  Location: [facility];  Service: Urology;  Laterality: Right;   HARDWARE REMOVAL Left 01/19/2021   Procedure: HARDWARE REMOVAL;  Surgeon: Arlyne Lame, MD;  Location: [facility];  Service: Orthopedics;  Laterality: Left;   ORIF PATELLA Left 06/02/2016   Procedure: OPEN REDUCTION INTERNAL (ORIF) FIXATION PATELLA;  Surgeon: Arlyne Lame, MD;  Location: [facility];  Service: Orthopedics;  Laterality: Left;   PARTIAL HYSTERECTOMY     PATELLAR TENDON REPAIR Left 06/02/2016   Procedure: PATELLA TENDON REPAIR;  Surgeon: Arlyne Lame, MD;  Location: [facility];  Service: Orthopedics;  Laterality: Left;   TUBAL LIGATION     UPPER GI ENDOSCOPY  2008   URETEROSCOPY WITH HOLMIUM LASER LITHOTRIPSY Left 05/24/2019   Procedure: URETEROSCOPY WITH HOLMIUM LASER LITHOTRIPSY;  Surgeon: Rea Cambridge, MD;  Location: [facility];  Service: Urology;  Laterality: Left;   vein closure procedure Bilateral 2010   Patient Active Problem List   Diagnosis Date Noted   COPD (chronic obstructive pulmonary disease) (HCC) 09/02/2019   History  of nephrolithiasis 08/20/2019   Kidney stone 08/20/2019   History of 2019 novel coronavirus disease (COVID-19) 06/25/2019   Hyperlipidemia, mixed 03/08/2019   B12 deficiency 12/19/2017   History of DVT (deep vein thrombosis) 12/19/2017   Medicare annual wellness visit, initial 12/19/2017   Low serum vitamin D 06/27/2017   Varicose veins with pain 12/17/2016   Chronic venous insufficiency 12/17/2016   Trochanteric bursitis 12/17/2016   History of Clostridium difficile colitis 07/19/2016   Left patella fracture 06/03/2016   Family history of colon cancer 12/12/2015   RAD (reactive airway disease), moderate persistent, uncomplicated 11/26/2015   LPRD (laryngopharyngeal reflux disease) 12/22/2013    ONSET DATE: date of referral 11/07/2023; pt reports intermittent symptoms several  years prior   REFERRING DIAG: G31.84 (ICD-10-CM) - Mild cognitive impairment of uncertain or unknown etiology   THERAPY DIAG:  Cognitive communication deficit  Rationale for Evaluation and Treatment Rehabilitation  SUBJECTIVE:   PERTINENT HISTORY: Pt is a 73 year old female that per recent neurology note presents with "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  MRI 04/13/2023 IMPRESSION: 1. No acute intracranial abnormality. 2. NeuroQuant volumetric analysis of the brain -   Briefly, the comparison with age and gender matched reference reveals whole brain volume at the 25th percentile, Hippocampal volume at the 33rd percentile, and thalamic volume below the 25th percentile.  PAIN:  Are you having pain? No   FALLS: Has patient fallen in last 6 months?  No  LIVING ENVIRONMENT: Lives with: lives alone Lives in: House/apartment  PLOF:  Level of assistance: Independent with ADLs, Independent with IADLs Employment: Retired   PATIENT GOALS   to assess and improve memory   SUBJECTIVE STATEMENT: Pt reports continued improvement in recovery from surgery on gums Pt accompanied by: self   OBJECTIVE:   TODAY'S TREATMENT:  Skilled treatment session focused on pt's cognition goals. SLP facilitated session by providing the following interventions:   Pt brought in her memory notebook, reports baseline long history of disorganization but she does have a "to do" list of errands/activities  Pt mentioned that she was going by [employer] this afternoon to inquire about her roof. This Clinical research associate recommended writing down the answers to her questions and ask for printed information to potentially help with recall of information when  discussing with her children.    MULTIFACTORIAL MEMORY QUESTIONNAIRE (MMQ)  Administered patient self-reported outcome measure Multifactorial Memory Questionnaire (MMQ). The Multifactorial Memory Questionnaire [facility]) consists of three scales measuring separate aspects of metamemory; Satisfaction, Ability and Strategy.   Pt's responses are converted to T-Scores with severity levels based on pt's T-Score.   Severity Levels (T-score) Very Low - < 20 Low - 20 to 29 Below Average - 30-39 Average - 40 to 60 Above Average - 60 to 70 High - 71 to 80 Very High - > 80  Pt reports:  Below Average - 30-39 Memory Satisfaction (T-score: 38) Average - 40 to 60 Memory Ability (T-score: 48) Average - 40 to 60 use of Memory Strategies (T-score: 52)    PATIENT EDUCATION: Education details: see above Person educated: Patient Education method: Explanation Education comprehension: needs further education   HOME EXERCISE PROGRAM:   See above  GOALS:  Goals reviewed with patient? Yes  SHORT TERM GOALS: Target date: 10 sessions  With Supervision A, patient will use strategies to improve memory for important information with 90% acc. Independently (ie., white board, daily planner/calendar, Apps on phone).   Baseline: Goal status: INITIAL  2.  Patient will report engagement in cognitive activities outside of ST for 5/7 days.  Baseline: Goal status: INITIAL   LONG TERM GOALS: Target date: 01/05/2024   With Supervision A, patient will use external aids to manage appointments, errands, and household chores consistently between 4 therapy sessions.  Baseline:  Goal status: INITIAL   2.   With Supervision A, patient will report <2 missed (medications/bills/appointments) over 2 week period with use of external memory aids/strategies.  Baseline:  Goal status: INITIAL  3. With Supervision A, patient will demonstrate knowledge of appropriate activities to support cognitive and  language  function outside of ST with assistance from family.   Baseline:  Goal status: INITIAL   ASSESSMENT:  CLINICAL IMPRESSION: Patient is a 73 y.o. 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 Pt presents with mild to moderate cognitive impairment c/b severe deficits in memory specifically delayed recall of information, short term memory and working memory as well as prospective memory.   Pt appears to be eager to implement recommendations/strategies and reports "I feel like I am doing better, I am staying more busy, I feel like I have made some good choices. I feel better about things, I feel like I am staying on top of things more." She has been open to having her family attend ST sessions. As such, this Clinical research associate reached out to her daughter-n-law Ova Bloomer) about potentially attending upcoming session to information gathering about functional abilities. Samantha confirmed.   See the above treatment note for details.   OBJECTIVE IMPAIRMENTS include attention, memory, and awareness. These impairments are limiting patient from managing medications, managing appointments, managing finances, household responsibilities, and ADLs/IADLs. Factors affecting potential to achieve goals and functional outcome are ability to learn/carryover information, medical prognosis, and severity of impairments. Patient will benefit from skilled SLP services to address above impairments and improve overall function.  REHAB POTENTIAL: Good  PLAN: SLP FREQUENCY: 1-2x/week  SLP DURATION: 8 weeks  PLANNED INTERVENTIONS: Environmental controls, Cognitive reorganization, Internal/external aids, Functional tasks, SLP instruction and feedback, Compensatory strategies, and Patient/family education   Ciin Brazzel B. Garlin Junker, M.S., CCC-SLP, Tree surgeon Certified Brain Injury Specialist [facility]  [facility] Rehabilitation Services Office [phone] Ascom [phone] Fax [phone]

## 2023-12-15 ENCOUNTER — Ambulatory Visit: Admitting: Speech Pathology

## 2023-12-15 DIAGNOSIS — R41841 Cognitive communication deficit: Secondary | ICD-10-CM | POA: Diagnosis not present

## 2023-12-15 NOTE — Therapy (Signed)
 OUTPATIENT SPEECH LANGUAGE PATHOLOGY  COGNITION TREATMENT NOTE DISCHARGE SUMMARY   Patient Name: Darlene Hubbard MRN: [id number] DOB:1951/06/12, 73 y.o., female Today's Date: 12/15/2023  PCP: Firman Hughes, MD REFERRING PROVIDER: Devora Folks, MD   End of Session - 12/15/23 1135     Visit Number 8    Number of Visits 17    Date for SLP Re-Evaluation 01/05/24    Authorization Type [employer] [facility] Medicare    Progress Note Due on Visit 10    SLP Start Time 1125    SLP Stop Time  1230    SLP Time Calculation (min) 65 min    Activity Tolerance Patient tolerated treatment well             Past Medical History:  Diagnosis Date   Actinic keratosis 08/06/2015   L med lower leg (bx proven)   Allergy    Anxiety    Asthma    Basal cell carcinoma 08/28/2015   R ant to sideburn zygoma    Basal cell carcinoma 02/16/2023   R nose, EDC   Bursitis    BOTH SHOULDER AND HIPS   COPD (chronic obstructive pulmonary disease) (HCC)    COVID-19 05/2019   Dysplastic nevus 02/28/2019   R upper back paraspinal - mod   Dysplastic nevus 03/17/2020   L lat mid lat tricep - mod   Fibromyalgia    GERD (gastroesophageal reflux disease)    Hiatal hernia    History of kidney stones    IBS (irritable bowel syndrome)    Mild obstructive sleep apnea    does not use Cpap   Morbid obesity (HCC)    Pneumonia    Spinal stenosis    Varicose veins    Past Surgical History:  Procedure Laterality Date   ABDOMINAL HYSTERECTOMY  1993   APPENDECTOMY  1995   BACK SURGERY     BREAST BIOPSY Left 2000   neg   CHOLECYSTECTOMY     COLONOSCOPY  2008   COLONOSCOPY WITH PROPOFOL  N/A 02/18/2016   Procedure: COLONOSCOPY WITH PROPOFOL ;  Surgeon: Marshall Skeeter, MD;  Location: [facility];  Service: Endoscopy;  Laterality: N/A;   CYSTOSCOPY W/ RETROGRADES Left 05/24/2019   Procedure: CYSTOSCOPY WITH RETROGRADE PYELOGRAM;  Surgeon: Rea Cambridge, MD;  Location: [facility];  Service: Urology;   Laterality: Left;   dental work     EXTRACORPOREAL SHOCK WAVE LITHOTRIPSY Right 06/07/2019   Procedure: EXTRACORPOREAL SHOCK WAVE LITHOTRIPSY (ESWL);  Surgeon: Rea Cambridge, MD;  Location: [facility];  Service: Urology;  Laterality: Right;   HARDWARE REMOVAL Left 01/19/2021   Procedure: HARDWARE REMOVAL;  Surgeon: Arlyne Lame, MD;  Location: [facility];  Service: Orthopedics;  Laterality: Left;   ORIF PATELLA Left 06/02/2016   Procedure: OPEN REDUCTION INTERNAL (ORIF) FIXATION PATELLA;  Surgeon: Arlyne Lame, MD;  Location: [facility];  Service: Orthopedics;  Laterality: Left;   PARTIAL HYSTERECTOMY     PATELLAR TENDON REPAIR Left 06/02/2016   Procedure: PATELLA TENDON REPAIR;  Surgeon: Arlyne Lame, MD;  Location: [facility];  Service: Orthopedics;  Laterality: Left;   TUBAL LIGATION     UPPER GI ENDOSCOPY  2008   URETEROSCOPY WITH HOLMIUM LASER LITHOTRIPSY Left 05/24/2019   Procedure: URETEROSCOPY WITH HOLMIUM LASER LITHOTRIPSY;  Surgeon: Rea Cambridge, MD;  Location: [facility];  Service: Urology;  Laterality: Left;   vein closure procedure Bilateral 2010   Patient Active Problem List   Diagnosis Date Noted  COPD (chronic obstructive pulmonary disease) (HCC) 09/02/2019   History of nephrolithiasis 08/20/2019   Kidney stone 08/20/2019   History of 2019 novel coronavirus disease (COVID-19) 06/25/2019   Hyperlipidemia, mixed 03/08/2019   B12 deficiency 12/19/2017   History of DVT (deep vein thrombosis) 12/19/2017   Medicare annual wellness visit, initial 12/19/2017   Low serum vitamin D  06/27/2017   Varicose veins with pain 12/17/2016   Chronic venous insufficiency 12/17/2016   Trochanteric bursitis 12/17/2016   History of Clostridium difficile colitis 07/19/2016   Left patella fracture 06/03/2016   Family history of colon cancer 12/12/2015   RAD (reactive airway disease), moderate persistent, uncomplicated 11/26/2015   LPRD (laryngopharyngeal reflux disease) 12/22/2013     ONSET DATE: date of referral 11/07/2023; pt reports intermittent symptoms several years prior   REFERRING DIAG: G31.84 (ICD-10-CM) - Mild cognitive impairment of uncertain or unknown etiology   THERAPY DIAG:  Cognitive communication deficit  Rationale for Evaluation and Treatment Rehabilitation  SUBJECTIVE:   PERTINENT HISTORY: Pt is a 73 year old female that per recent neurology note presents with "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  MRI 04/13/2023 IMPRESSION: 1. No acute intracranial abnormality. 2. NeuroQuant volumetric analysis of the brain -   Briefly, the comparison with age and gender matched reference reveals whole brain volume at the 25th percentile, Hippocampal volume at the 33rd percentile, and thalamic volume below the 25th percentile.  PAIN:  Are you having pain? No   FALLS: Has patient fallen in last 6 months?  No  LIVING ENVIRONMENT: Lives with: lives alone Lives in: House/apartment  PLOF:  Level of assistance: Independent with ADLs, Independent with IADLs Employment: Retired   PATIENT GOALS   to assess and improve memory   SUBJECTIVE STATEMENT: Pt ocntinues to report overall improvement "I think that I am doing really well"  Pt accompanied by: self   OBJECTIVE:   TODAY'S TREATMENT:  Skilled treatment session focused on pt's cognition goals. SLP facilitated session by providing the following interventions:  Pt arrived to session by herself. Daughter-in-law Darlene Hubbard) has previously planned to attend as confirmed on 12/13/2023. Pt called her at the beginning of session with DIL unable to attend today.   Pt reports that she is using a calendar with more efficiency.    Reports  that her son is going to set her up with automatic draft and she is agreeable to this. Given pt's memory deficits, support this compensatory method of paying bills. Her son is also working with pt on organizing her medicine.   At this time, there are not any further therapy activities to explain or provide to pt and discharge is appropriate.   PATIENT EDUCATION: Education details: see above Person educated: Patient Education method: Explanation Education comprehension: needs further education   HOME EXERCISE PROGRAM:   See above  GOALS:  Goals reviewed with patient? Yes  SHORT TERM GOALS: Target date: 10 sessions  With Supervision A, patient will use strategies to improve memory for important information with 90% acc. Independently (ie., white board, daily planner/calendar, Apps on phone).   Baseline: Goal status: INITIAL - MET as per pt report  2.  Patient will report engagement in cognitive activities outside of ST for 5/7 days.  Baseline: Goal status: INITIAL - MET as per pt report  LONG TERM GOALS: Target date: 01/05/2024   With Supervision A, patient will use external aids to manage appointments, errands, and household chores consistently between 4 therapy sessions.  Baseline:  Goal status: INITIAL - MET as per pt report   2.   With Supervision A, patient will report <2 missed (medications/bills/appointments) over 2 week period with use of external memory aids/strategies.  Baseline:  Goal status: INITIAL - MET as per pt report  3. With Supervision A, patient will demonstrate knowledge of appropriate activities to support cognitive and  language function outside of ST with assistance from family.   Baseline:  Goal status: INITIAL - MET as per pt report   ASSESSMENT:  CLINICAL IMPRESSION: Patient is a 73 y.o. 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 Pt reports that she  is engaging in the above mentioned activities and is "doing better." She also reports having a supportive family. Attempts at caregiver education were made.   OBJECTIVE IMPAIRMENTS include attention, memory, and awareness. These impairments are limiting patient from managing medications, managing appointments, managing finances, household responsibilities, and ADLs/IADLs. Factors affecting potential to achieve goals and functional outcome are ability to learn/carryover information, medical prognosis, and severity of impairments. Patient will benefit from skilled SLP services to address above impairments and improve overall function.  PLAN: Pt has met maximal potential from skilled ST services and is appropriate for discharge. Family is aware.   Sanda Dejoy B. Garlin Junker, M.S., CCC-SLP, Tree surgeon Certified Brain Injury Specialist [facility]  [facility] Rehabilitation Services Office [phone] Ascom [phone] Fax [phone]

## 2023-12-20 ENCOUNTER — Ambulatory Visit: Admitting: Speech Pathology

## 2023-12-22 ENCOUNTER — Ambulatory Visit: Admitting: Speech Pathology

## 2023-12-27 ENCOUNTER — Encounter: Admitting: Speech Pathology

## 2023-12-29 ENCOUNTER — Encounter: Admitting: Speech Pathology

## 2024-01-03 ENCOUNTER — Encounter: Admitting: Speech Pathology

## 2024-01-05 ENCOUNTER — Encounter: Admitting: Speech Pathology

## 2024-01-10 ENCOUNTER — Encounter: Admitting: Speech Pathology

## 2024-01-12 ENCOUNTER — Encounter: Admitting: Speech Pathology

## 2024-01-17 ENCOUNTER — Encounter (INDEPENDENT_AMBULATORY_CARE_PROVIDER_SITE_OTHER): Payer: Self-pay

## 2024-01-17 ENCOUNTER — Encounter: Admitting: Speech Pathology

## 2024-01-19 ENCOUNTER — Encounter: Admitting: Speech Pathology

## 2024-01-24 ENCOUNTER — Encounter: Admitting: Speech Pathology

## 2024-01-26 ENCOUNTER — Encounter: Admitting: Speech Pathology

## 2024-01-31 ENCOUNTER — Encounter: Admitting: Speech Pathology

## 2024-02-02 ENCOUNTER — Other Ambulatory Visit: Payer: Self-pay | Admitting: Internal Medicine

## 2024-02-02 ENCOUNTER — Encounter: Admitting: Speech Pathology

## 2024-02-02 DIAGNOSIS — Z1231 Encounter for screening mammogram for malignant neoplasm of breast: Secondary | ICD-10-CM

## 2024-02-07 ENCOUNTER — Encounter: Admitting: Speech Pathology

## 2024-02-09 ENCOUNTER — Encounter: Admitting: Speech Pathology

## 2024-02-09 ENCOUNTER — Ambulatory Visit: Admitting: Dermatology

## 2024-02-16 ENCOUNTER — Ambulatory Visit: Payer: Medicare PPO | Admitting: Dermatology

## 2024-02-20 ENCOUNTER — Ambulatory Visit: Admitting: Dermatology

## 2024-02-21 ENCOUNTER — Encounter

## 2024-02-24 NOTE — Progress Notes (Signed)
 Sleep Medicine   Office Visit  Patient Name: Darlene Hubbard DOB: 1951/01/02 MRN [id number]    Chief Complaint: OSA   Brief History:  Darlene Hubbard presents for an initial consult for sleep evaluation and to establish care. The patient has a 8 month history of sleep apnea and is currently on a CPAP. Prior to using a PAP, sleep quality was poor. This was noted every night. The patient reported the following symptoms: snoring, gasping, choking, occasional headaches, brain fog, trouble remembering events. The patient goes to sleep between 1000 pm and 1100 pm and wakes up between 0700 am and 0800 am and will wake up at least twice in between. The patient depression and anxiety as a history of psychiatric problems. The Epworth Sleepiness Score is 6 out of 24 . The patient relates  Cardiovascular risk factors include: venous insufficiency. The patient is currently on a APAP@ 5-20 cmH2O. The patient reports not using Darlene Hubbard PAP and does not know if she would feel rested after sleeping with PAP.  The patient reports benefiting from PAP use and would like for Darlene Hubbard to continue using PAP. Reported sleepiness is  not improved. Patient is complaining that pressure is too strong. The compliance download shows  2% compliance with an average use time of 1 hours. The AHI is 0.6.  Leakage is 53.5.  95th % pressure is 5.1. The patient continues to require PAP therapy as a medical necessity in order to eliminate Darlene Hubbard sleep apnea.    ROS  General: (-) fever, (-) chills, (-) night sweat Nose and Sinuses: (-) nasal stuffiness or itchiness, (-) postnasal drip, (-) nosebleeds, (-) sinus trouble. Mouth and Throat: (-) sore throat, (-) hoarseness. Neck: (-) swollen glands, (-) enlarged thyroid, (-) neck pain. Respiratory: + cough, - shortness of breath, + wheezing. Neurologic: - numbness, - tingling. Psychiatric: - anxiety, - depression Sleep behavior: -sleep paralysis -hypnogogic hallucinations -dream enactment      -vivid dreams  -cataplexy -night terrors -sleep walking   Current Medication: Outpatient Encounter Medications as of 02/27/2024  Medication Sig Note   pantoprazole  (PROTONIX ) 40 MG tablet Take 40 mg by mouth.    venlafaxine XR (EFFEXOR-XR) 75 MG 24 hr capsule Take 75 mg by mouth.    acetaminophen  (TYLENOL ) 500 MG tablet Take by mouth.    albuterol (VENTOLIN HFA) 108 (90 Base) MCG/ACT inhaler Inhale into the lungs.    azelastine (ASTELIN) 0.1 % nasal spray Place into the nose.    EPINEPHrine  0.3 mg/0.3 mL IJ SOAJ injection Inject 0.3 mg into the muscle as needed for anaphylaxis.    escitalopram (LEXAPRO) 10 MG tablet Take 10 mg by mouth daily.    fluticasone-salmeterol (ADVAIR) 250-50 MCG/ACT AEPB Inhale 1 puff into the lungs 2 (two) times daily as needed (respiratory issues.).     galantamine (RAZADYNE) 4 MG tablet Take 4 mg by mouth 2 (two) times daily.    levalbuterol (XOPENEX) 1.25 MG/0.5ML nebulizer solution Inhale into the lungs.    montelukast  (SINGULAIR ) 10 MG tablet Take 10 mg by mouth daily.    propranolol (INDERAL) 60 MG tablet Take 60 mg by mouth daily. 01/16/2021: ON HOLD    No facility-administered encounter medications on file as of 02/27/2024.    Surgical History: Past Surgical History:  Procedure Laterality Date   ABDOMINAL HYSTERECTOMY  1993   APPENDECTOMY  1995   BACK SURGERY     BREAST BIOPSY Left 2000   neg   CHOLECYSTECTOMY     COLONOSCOPY  2008  COLONOSCOPY WITH PROPOFOL  N/A 02/18/2016   Procedure: COLONOSCOPY WITH PROPOFOL ;  Surgeon: Reyes LELON Cota, MD;  Location: [facility] ENDOSCOPY;  Service: Endoscopy;  Laterality: N/A;   CYSTOSCOPY W/ RETROGRADES Left 05/24/2019   Procedure: CYSTOSCOPY WITH RETROGRADE PYELOGRAM;  Surgeon: Kassie Ozell SAUNDERS, MD;  Location: [facility] ORS;  Service: Urology;  Laterality: Left;   dental work     EXTRACORPOREAL SHOCK WAVE LITHOTRIPSY Right 06/07/2019   Procedure: EXTRACORPOREAL SHOCK WAVE LITHOTRIPSY (ESWL);  Surgeon: Kassie Ozell SAUNDERS, MD;  Location:  [facility] ORS;  Service: Urology;  Laterality: Right;   HARDWARE REMOVAL Left 01/19/2021   Procedure: HARDWARE REMOVAL;  Surgeon: Mardee Lynwood SQUIBB, MD;  Location: [facility] ORS;  Service: Orthopedics;  Laterality: Left;   ORIF PATELLA Left 06/02/2016   Procedure: OPEN REDUCTION INTERNAL (ORIF) FIXATION PATELLA;  Surgeon: Lynwood SQUIBB Mardee, MD;  Location: [facility] ORS;  Service: Orthopedics;  Laterality: Left;   PARTIAL HYSTERECTOMY     PATELLAR TENDON REPAIR Left 06/02/2016   Procedure: PATELLA TENDON REPAIR;  Surgeon: Lynwood SQUIBB Mardee, MD;  Location: [facility] ORS;  Service: Orthopedics;  Laterality: Left;   TUBAL LIGATION     UPPER GI ENDOSCOPY  2008   URETEROSCOPY WITH HOLMIUM LASER LITHOTRIPSY Left 05/24/2019   Procedure: URETEROSCOPY WITH HOLMIUM LASER LITHOTRIPSY;  Surgeon: Kassie Ozell SAUNDERS, MD;  Location: [facility] ORS;  Service: Urology;  Laterality: Left;   vein closure procedure Bilateral 2010    Medical History: Past Medical History:  Diagnosis Date   Actinic keratosis 08/06/2015   L med lower leg (bx proven)   Allergy    Anxiety    Asthma    Basal cell carcinoma 08/28/2015   R ant to sideburn zygoma    Basal cell carcinoma 02/16/2023   R nose, [facility]   Bursitis    BOTH SHOULDER AND HIPS   COPD (chronic obstructive pulmonary disease) (HCC)    COVID-19 05/2019   Dysplastic nevus 02/28/2019   R upper back paraspinal - mod   Dysplastic nevus 03/17/2020   L lat mid lat tricep - mod   Fibromyalgia    GERD (gastroesophageal reflux disease)    Hiatal hernia    History of kidney stones    IBS (irritable bowel syndrome)    Mild obstructive sleep apnea    does not use Cpap   Morbid obesity (HCC)    Pneumonia    Spinal stenosis    Varicose veins     Family History: Non contributory to the present illness  Social History: Social History   Socioeconomic History   Marital status: Widowed    Spouse name: Not on file   Number of children: Not on file   Years of education: Not on file   Highest  education level: Not on file  Occupational History   Not on file  Tobacco Use   Smoking status: Never   Smokeless tobacco: Never  Vaping Use   Vaping status: Never Used  Substance and Sexual Activity   Alcohol use: Not Currently   Drug use: No   Sexual activity: Not on file  Other Topics Concern   Not on file  Social History Narrative   Son lives with patient   Social Drivers of Health   Financial Resource Strain: Low Risk  (03/23/2023)   Received from [facility]   Overall Financial Resource Strain (CARDIA)    Difficulty of Paying Living Expenses: Not hard at all  Food Insecurity: No Food Insecurity (03/23/2023)   Received [facility]   Hunger Vital Sign    Within the past 12 months, you worried that your food would run out before you got the money to buy more.: Never true    Within the past 12 months, the food you bought just didn't last and you didn't have money to get more.: Never true  Transportation Needs: No Transportation Needs (03/23/2023)   Received from [facility] - Transportation    In the past 12 months, has lack of transportation kept you from medical appointments or from getting medications?: No    Lack of Transportation (Non-Medical): No  Physical Activity: Not on file  Stress: Not on file  Social Connections: Not on file  Intimate Partner Violence: Not on file    Vital Signs: Blood pressure (!) 149/84, pulse 72, resp. rate 16, height 5' 3 (1.6 m), weight 161 lb (73 kg), SpO2 96%. Body mass index is 28.52 kg/m.   Examination: General Appearance: The patient is well-developed, well-nourished, and in no distress. Neck Circumference: 37 cm Skin: Gross inspection of skin unremarkable. Head: normocephalic, no gross deformities. Eyes: no gross deformities noted. ENT: ears appear grossly normal Neurologic: Alert and oriented. No involuntary movements.    STOP BANG RISK ASSESSMENT S  (snore) Have you been told that you snore?     YES   T (tired) Are you often tired, fatigued, or sleepy during the day?   YES  O (obstruction) Do you stop breathing, choke, or gasp during sleep? YES   P (pressure) Do you have or are you being treated for high blood pressure? NO   B (BMI) Is your body index greater than 35 kg/m? YES   A (age) Are you 48 years old or older? YES   N (neck) Do you have a neck circumference greater than 16 inches?   NO   G (gender) Are you a female? NO   TOTAL STOP/BANG "YES" ANSWERS 5                                                               A STOP-Bang score of 2 or less is considered low risk, and a score of 5 or more is high risk for having either moderate or severe OSA. For people who score 3 or 4, doctors may need to perform further assessment to determine how likely they are to have OSA.         EPWORTH SLEEPINESS SCALE:  Scale:  (0)= no chance of dozing; (1)= slight chance of dozing; (2)= moderate chance of dozing; (3)= high chance of dozing  Chance  Situtation    Sitting and reading: 1    Watching TV: 1    Sitting Inactive in public: 0    As a passenger in car: 1      Lying down to rest: 2    Sitting and talking: 0    Sitting quielty after lunch: 1    In a car, stopped in traffic: 0   TOTAL SCORE:   6 out of 24    SLEEP STUDIES:  HST (05/2023) AHI (4%) 4, RDI 16/hr, min SpO2 91.9%. Pt set up with a new machine from [employer].   LABS: No results found for this or any  previous visit (from the past 2160 hours).  Radiology: MR BRAIN WO CONTRAST Result Date: 04/27/2023 CLINICAL DATA:  73 year old female with a history of memory loss. EXAM: MRI HEAD WITHOUT CONTRAST TECHNIQUE: Multiplanar, multiecho pulse sequences of the brain and surrounding structures were obtained without intravenous contrast. Additionally, using NeuroQuant software a 3D volumetric analysis of the brain was performed and is compared to a normative  database adjusted for age, gender and intracranial volume. COMPARISON:  Head CT 01/28/2023. FINDINGS: Brain: No restricted diffusion to suggest acute infarction. No midline shift, mass effect, evidence of mass lesion, ventriculomegaly, extra-axial collection or acute intracranial hemorrhage. Cervicomedullary junction and pituitary are within normal limits. Elnor and white matter signal is within normal limits for age throughout the brain. No cortical encephalomalacia or chronic cerebral blood products are identified. Vascular: Major intracranial vascular flow voids are preserved. Mild intracranial artery tortuosity. Skull and upper cervical spine: Negative for age visible cervical spine. Visualized bone marrow signal is within normal limits. Sinuses/Orbits: Postoperative changes to both globes. Paranasal sinuses and mastoids are stable and well aerated. Other: Grossly normal visible internal auditory structures. Negative visible scalp and face. NeuroQuant Findings: Volumetric analysis of the brain was performed, with a fully detailed report in [facility]. Briefly, the comparison with age and gender matched reference reveals whole brain volume at the 25th percentile, Hippocampal volume at the 33rd percentile, and thalamic volume below the 25th percentile. IMPRESSION: 1. No acute intracranial abnormality. 2. NeuroQuant volumetric analysis of the brain, see details on [employer]. Electronically Signed   By: VEAR Hurst M.D.   On: 04/27/2023 08:47    No results found.  No results found.    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, psychotic disturbance, mood disturbance, or anxiety (HCC) 08/08/2023   COPD (chronic obstructive pulmonary disease) (HCC) 09/02/2019   History of nephrolithiasis 08/20/2019   Kidney stone 08/20/2019   History of  2019 novel coronavirus disease (COVID-19) 06/25/2019   Hyperlipidemia, mixed 03/08/2019   B12 deficiency 12/19/2017   History of DVT (deep vein thrombosis) 12/19/2017   Medicare annual wellness visit, initial 12/19/2017   Low serum vitamin D  06/27/2017   Varicose veins with pain 12/17/2016   Chronic venous insufficiency 12/17/2016   Trochanteric bursitis 12/17/2016   History of Clostridium difficile colitis 07/19/2016   Left patella fracture 06/03/2016   Family history of colon cancer 12/12/2015   RAD (reactive airway disease), moderate persistent, uncomplicated 11/26/2015   LPRD (laryngopharyngeal reflux disease) 12/22/2013   1. OSA (obstructive sleep apnea) (Primary) Patient evaluation reveals OSA, not tolerating APAP Suggest: restart with a cpap titration to assess/treat the patient's sleep disordered breathing. She was set up on apap, never had a titration, and cannot tolerate the pressure of the APAP machine. She has not used it but a handful of times. The patient was also counselled on weight loss to optimize sleep health.  2. CPAP use counseling CPAP Counseling: had a lengthy discussion with the patient regarding the importance of PAP therapy in management of the sleep apnea. Patient appears to understand the risk factor reduction and also understands the risks associated with untreated sleep apnea. Patient will try to make a good faith effort to remain compliant with therapy. Also instructed the patient on proper cleaning of the device including the water must be changed daily if possible and use of  distilled water is preferred. Patient understands that the machine should be regularly cleaned with appropriate recommended cleaning solutions that do not damage the PAP machine for example given white vinegar and water rinses. Other methods such as ozone treatment may not be as good as these simple methods to achieve cleaning.   3. 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 The neurologist ordered a sleep study as part of the workup for Darlene Hubbard memory issues.      General Counseling: I have discussed the findings of the evaluation and examination with Darlene Hubbard.  I have also discussed any further diagnostic evaluation thatmay be needed or ordered today. Darlene Hubbard verbalizes understanding of the findings of todays visit. We also reviewed Darlene Hubbard medications today and discussed drug interactions and side effects including but not limited excessive drowsiness and altered mental states. We also discussed that there is always a risk not just to Darlene Hubbard but also people around Darlene Hubbard. she has been encouraged to call the office with any questions or concerns that should arise related to todays visit.  No orders of the defined types were placed in this encounter.       I have personally obtained a history, evaluated the patient, evaluated pertinent data, formulated the assessment and plan and placed orders. This patient was seen today by Lauraine Lay, PA-C in collaboration with Dr. Elfreda Bathe.     Elfreda DELENA Bathe, MD [facility] Diplomate ABMS Pulmonary and Critical Care Medicine Sleep medicine

## 2024-02-27 ENCOUNTER — Ambulatory Visit (INDEPENDENT_AMBULATORY_CARE_PROVIDER_SITE_OTHER): Payer: Self-pay | Admitting: Internal Medicine

## 2024-02-27 VITALS — BP 149/84 | HR 72 | Resp 16 | Ht 63.0 in | Wt 161.0 lb

## 2024-02-27 DIAGNOSIS — F02A0 Dementia in other diseases classified elsewhere, mild, without behavioral disturbance, psychotic disturbance, mood disturbance, and anxiety: Secondary | ICD-10-CM | POA: Diagnosis not present

## 2024-02-27 DIAGNOSIS — Z7189 Other specified counseling: Secondary | ICD-10-CM

## 2024-02-27 DIAGNOSIS — G300 Alzheimer's disease with early onset: Secondary | ICD-10-CM | POA: Diagnosis not present

## 2024-02-27 DIAGNOSIS — G4733 Obstructive sleep apnea (adult) (pediatric): Secondary | ICD-10-CM

## 2024-02-27 NOTE — Patient Instructions (Signed)
 Living With Sleep Apnea Sleep apnea is a condition that affects your breathing while you're sleeping. Your tongue or the tissue in your throat may block the flow of air while you sleep. You may have shallow breathing or stop breathing for short periods of time. The breaks in breathing interrupt the deep sleep that you need to feel rested. Even if you don't wake up from the gaps in breathing, you may feel tired during the day. People with sleep apnea may snore loudly. You may have a headache in the morning and feel anxious or depressed. How can sleep apnea affect me? Sleep apnea increases your chances of being very tired during the day. This is called daytime fatigue. Sleep apnea can also increase your risk of: Heart attack. Stroke. Obesity. Type 2 diabetes. Heart failure. Irregular heartbeat. High blood pressure. If you are very tired during the day, you may be more likely to: Not do well in school or at work. Fall asleep while driving. Have trouble paying attention. Develop depression or anxiety. Have problems having sex. This is called sexual dysfunction. What actions can I take to manage sleep apnea? Sleep apnea treatment  If you were given a device to open your airway while you sleep, use it only as told by your health care provider. You may be given: An oral appliance. This is a mouthpiece that shifts your lower jaw forward. A continuous positive airway pressure (CPAP) device. This blows air through a mask. A nasal expiratory positive airway pressure (EPAP) device. This has valves that you put into each nostril. A bi-level positive airway pressure (BIPAP) device. This blows air through a mask when you breathe in and breathe out. You may need surgery if other treatments don't work for you. Sleep habits Go to sleep and wake up at the same time every day. This helps set your internal clock for sleeping. If you stay up later than usual on weekends, try to get up in the morning within 2  hours of the time you usually wake up. Try to get at least 7-9 hours of sleep each night. Stop using a computer, tablet, and mobile phone a few hours before bedtime. Do not take long naps during the day. If you nap, limit it to 30 minutes. Have a relaxing bedtime routine. Reading or listening to music may relax you and help you sleep. Use your bedroom only for sleep. Keep your television and computer out of your bedroom. Keep your bedroom cool, dark, and quiet. Use a supportive mattress and pillows. Follow your provider's instructions for other changes to sleep habits. Nutrition Do not eat big meals in the evening. Do not have caffeine in the later part of the day. The effects of caffeine can last for more than 5 hours. Follow your provider's instructions for any changes to what you eat and drink. Lifestyle Do not drink alcohol before bedtime. Alcohol can cause you to fall asleep at first, but then it can cause you to wake up in the middle of the night and have trouble getting back to sleep. Do not smoke, vape, or use nicotine or tobacco. Medicines Take over-the-counter and prescription medicines only as told by your provider. Do not use over-the-counter sleep medicine. You may become dependent on this medicine, and it can make sleep apnea worse. Do not take medicines, such as sedatives and narcotics, unless told to by your provider. Activity Exercise on most days, but avoid exercising in the evening. Exercising near bedtime can interfere with sleeping.  If possible, spend time outside every day. Natural light helps with your internal clock. General information Lose weight if you need to. Stay at a healthy weight. If you are having surgery, make sure to tell your provider that you have sleep apnea. You may need to bring your device with you. Keep all follow-up visits. Your provider will want to check on your condition. Where to find more information National Heart, Lung, and Blood  Institute: [employer] This information is not intended to replace advice given to you by your health care provider. Make sure you discuss any questions you have with your health care provider. Document Revised: 12/08/2022 Document Reviewed: 12/08/2022 Elsevier Patient Education  2024 [employer].

## 2024-03-01 ENCOUNTER — Encounter: Payer: Self-pay | Admitting: Gastroenterology

## 2024-03-01 ENCOUNTER — Encounter

## 2024-03-15 ENCOUNTER — Emergency Department

## 2024-03-15 ENCOUNTER — Emergency Department
Admission: EM | Admit: 2024-03-15 | Discharge: 2024-03-15 | Disposition: A | Discharge status: Home / Self Care | Attending: Emergency Medicine | Admitting: Emergency Medicine

## 2024-03-15 ENCOUNTER — Encounter: Payer: Self-pay | Admitting: Emergency Medicine

## 2024-03-15 ENCOUNTER — Other Ambulatory Visit: Payer: Self-pay

## 2024-03-15 DIAGNOSIS — J45909 Unspecified asthma, uncomplicated: Secondary | ICD-10-CM | POA: Insufficient documentation

## 2024-03-15 DIAGNOSIS — Y92009 Unspecified place in unspecified non-institutional (private) residence as the place of occurrence of the external cause: Secondary | ICD-10-CM | POA: Diagnosis not present

## 2024-03-15 DIAGNOSIS — W010XXA Fall on same level from slipping, tripping and stumbling without subsequent striking against object, initial encounter: Secondary | ICD-10-CM | POA: Insufficient documentation

## 2024-03-15 DIAGNOSIS — S81012A Laceration without foreign body, left knee, initial encounter: Secondary | ICD-10-CM | POA: Diagnosis present

## 2024-03-15 DIAGNOSIS — S8001XA Contusion of right knee, initial encounter: Secondary | ICD-10-CM

## 2024-03-15 DIAGNOSIS — W19XXXA Unspecified fall, initial encounter: Secondary | ICD-10-CM

## 2024-03-15 MED ORDER — HYDROCODONE-ACETAMINOPHEN 5-325 MG PO TABS: 1.0000 | ORAL_TABLET | Freq: Three times a day (TID) | ORAL | 0 refills | Status: AC | PRN

## 2024-03-15 MED ORDER — HYDROCODONE-ACETAMINOPHEN 5-325 MG PO TABS
1.0000 | ORAL_TABLET | Freq: Once | ORAL | Status: AC
  Administered 2024-03-15: 1 via ORAL
  Filled 2024-03-15: qty 1

## 2024-03-15 MED ORDER — ONDANSETRON 4 MG PO TBDP
4.0000 mg | ORAL_TABLET | Freq: Once | ORAL | Status: AC
  Administered 2024-03-15: 4 mg via ORAL
  Filled 2024-03-15: qty 1

## 2024-03-15 MED ORDER — LIDOCAINE HCL (PF) 1 % IJ SOLN
10.0000 mL | Freq: Once | INTRAMUSCULAR | Status: AC
  Administered 2024-03-15: 10 mL
  Filled 2024-03-15: qty 10

## 2024-03-15 NOTE — ED Triage Notes (Signed)
 Patient to ED via [facility] from home after a fall. PT states she was getting out of car and tripped up the stairs going into house. Laceration noted to left leg- bleeding controlled. Denies blood thinners or LOC- did not hit head. C/o bilateral knee pain. Abrasion noted to left hand.

## 2024-03-15 NOTE — ED Provider Notes (Signed)
 [facility] Provider Note     Event Date/Time   First MD Initiated Contact with Patient 03/15/24 1415     (approximate)   History   Fall   HPI  Darlene Hubbard is a 73 y.o. female with a history of asthma, GERD, morbid obesity, and IBS, presents to the ED following mechanical fall.  Patient presents via EMS from home.  She was going out of her car when she probably tripped on the stairs going up into the house.  She presents with laceration to her left leg.  She denies any head injury or LOC.  She also notes abrasions to her knees as well as her left hand.  No other injury reported at this time.   Physical Exam   Triage Vital Signs: ED Triage Vitals  Encounter Vitals Group     BP 03/15/24 1342 128/60     Girls Systolic BP Percentile --      Girls Diastolic BP Percentile --      Boys Systolic BP Percentile --      Boys Diastolic BP Percentile --      Pulse Rate 03/15/24 1342 74     Resp 03/15/24 1342 17     Temp 03/15/24 1342 97.9 F (36.6 C)     Temp Source 03/15/24 1342 Oral     SpO2 03/15/24 1342 97 %     Weight 03/15/24 1343 163 lb (73.9 kg)     Height 03/15/24 1343 5' 4 (1.626 m)     Head Circumference --      Peak Flow --      Pain Score 03/15/24 1342 1     Pain Loc --      Pain Education --      Exclude from Growth Chart --     Most recent vital signs: Vitals:   03/15/24 1342  BP: 128/60  Pulse: 74  Resp: 17  Temp: 97.9 F (36.6 C)  SpO2: 97%    General Awake, no distress. NAD HEENT NCAT. PERRL. EOMI. No rhinorrhea. Mucous membranes are moist.  CV:  Good peripheral perfusion. RRR.  Normal distal pulses.  Skin is warm and dry distally. RESP:  Normal effort. CTA ABD:  No distention.  MSK:  Left knee with a 6 cm horizontal laceration at the proximal tibia with exposure of subcu skin.  Large hematoma to the lateral LLE.  No active bleeding noted at this time.  ED Results / Procedures / Treatments    Labs (all labs ordered are listed, but only abnormal results are displayed) Labs Reviewed - No data to display   EKG   RADIOLOGY  I personally viewed and evaluated these images as part of my medical decision making, as well as reviewing the written report by the radiologist.  ED Provider Interpretation: no acute fractures  DG Knee Complete 4 Views Right Result Date: 03/15/2024 CLINICAL DATA:  Fall, knee pain EXAM: RIGHT KNEE - COMPLETE 4+ VIEW COMPARISON:  None Available. FINDINGS: Chondrocalcinosis. Mild degenerative changes in the right knee with joint space narrowing and spurring. No joint effusion. No acute bony abnormality. Specifically, no fracture, subluxation, or dislocation. IMPRESSION: Mild degenerative changes with chondrocalcinosis. No acute bony abnormality. Electronically Signed   By: Franky Crease M.D.   On: 03/15/2024 15:11   DG Knee Complete 4 Views Left Result Date: 03/15/2024 CLINICAL DATA:  Fall, knee pain EXAM: LEFT KNEE - COMPLETE 4+ VIEW COMPARISON:  None Available. FINDINGS:  Chondrocalcinosis. Moderate degenerative changes in the left knee, most pronounced in the patellofemoral compartment. No joint effusion. No acute bony abnormality. Specifically, no fracture, subluxation, or dislocation. IMPRESSION: Moderate degenerative changes in the left knee with chondrocalcinosis. No acute bony abnormality. Electronically Signed   By: Franky Crease M.D.   On: 03/15/2024 15:10   PROCEDURES:  Critical Care performed: No  Procedures  See separate procedure note  MEDICATIONS ORDERED IN ED: Medications  lidocaine  (PF) (XYLOCAINE ) 1 % injection 10 mL (has no administration in time range)  ondansetron  (ZOFRAN -ODT) disintegrating tablet 4 mg (4 mg Oral Given 03/15/24 1527)  HYDROcodone -acetaminophen  (NORCO/VICODIN) 5-325 MG per tablet 1 tablet (1 tablet Oral Given 03/15/24 1528)     IMPRESSION / MDM / ASSESSMENT AND PLAN / ED COURSE  I reviewed the triage vital signs and  the nursing notes.                              Differential diagnosis includes, but is not limited to, contusion, abrasion, laceration, bursitis, fracture, dislocation  Patient's presentation is most consistent with acute complicated illness / injury requiring diagnostic workup.  Patient's diagnosis is consistent with mechanical fall resulting in a left leg laceration and hematoma as well as a right knee contusion.  X-rays are normal and reassuring with no acute signs of asymmetry rotation.  Physical exam is reassuring.  Patient agrees and consents to a wound repair which was performed by my colleague at shift signout.  Patient will be discharged home with prescriptions for hydrocodone . Patient is to follow up with her PCP as suggested, as needed or otherwise directed. Patient is given ED precautions to return to the ED for any worsening or new symptoms.   FINAL CLINICAL IMPRESSION(S) / ED DIAGNOSES   Final diagnoses:  Fall, initial encounter  Knee laceration, left, initial encounter  Contusion of right knee, initial encounter     Rx / DC Orders   ED Discharge Orders          Ordered    HYDROcodone -acetaminophen  (NORCO/VICODIN) 5-325 MG tablet  3 times daily PRN        03/15/24 1550             Note:  This document was prepared using Dragon voice recognition software and may include unintentional dictation errors.    [name] Aldona, PA-C 03/15/24 1554    Jacolyn Pae, MD 03/15/24 1930

## 2024-03-15 NOTE — ED Notes (Signed)
 Patient declined discharge vital signs.

## 2024-03-15 NOTE — ED Notes (Signed)
 First Nurse Note: Pt to ED via [facility] from home for a fall. Pt got out of the car and tripped and fell. Pt has laceration below left knee. Pt also her hematoma on left leg. Bleeding is controlled, Pt is not on blood thinners. Pt did not hit her head, did not have LOC.

## 2024-03-15 NOTE — ED Provider Notes (Signed)
    [facility] Emergency Department Provider Note  ----------------------------------------- 3:34 PM on 03/15/2024 -----------------------------------------  Blood pressure 128/60, pulse 74, temperature 97.9 F (36.6 C), temperature source Oral, resp. rate 17, height 5' 4 (1.626 m), weight 73.9 kg, SpO2 97%.  Assuming care from [facility], PA-C.  In short, Darlene Hubbard is a 73 y.o. female with a chief complaint of Fall .  Refer to the original H&P for additional details.  The current plan of care is to laceration repair and discharge.     PROCEDURES:  Critical Care performed: No  .Laceration Repair  Date/Time: 03/15/2024 6:31 PM  Performed by: Margrette Monte A, PA-C Authorized by: Margrette Monte A, PA-C   Consent:    Consent obtained:  Verbal   Consent given by:  Patient   Risks, benefits, and alternatives were discussed: yes     Risks discussed:  Infection, poor cosmetic result, pain, retained foreign body, poor wound healing, nerve damage and need for additional repair Universal protocol:    Patient identity confirmed:  Verbally with patient Anesthesia:    Anesthesia method:  Local infiltration   Local anesthetic:  Lidocaine  1% w/o epi Laceration details:    Location:  Leg   Leg location:  L knee   Length (cm):  6   Depth (mm):  3 Exploration:    Hemostasis achieved with:  Direct pressure   Imaging obtained: x-ray     Imaging outcome: foreign body not noted     Wound exploration: wound explored through full range of motion and entire depth of wound visualized   Treatment:    Area cleansed with:  Povidone-iodine and saline   Amount of cleaning:  Standard   Irrigation solution:  Sterile saline   Irrigation method:  Syringe Skin repair:    Repair method:  Sutures and Steri-Strips   Suture size:  4-0   Suture material:  Nylon   Suture technique:  Horizontal mattress and simple interrupted   Number of sutures:  11   Number of  Steri-Strips:  2 Approximation:    Approximation:  Close Repair type:    Repair type:  Simple Post-procedure details:    Dressing:  Non-adherent dressing   Procedure completion:  Quinton Margrette, Gilmore List A, PA-C 03/15/24 CATHLYN Jacolyn Pae, MD 03/15/24 1930

## 2024-03-15 NOTE — Discharge Instructions (Addendum)
 Keep the wound clean, dry, and covered.  Apply ice over any swollen or bruised joints help reduce swelling and inflammation.  See your primary provider in 10 to 12 days for suture removal.

## 2024-03-16 ENCOUNTER — Encounter

## 2024-03-19 ENCOUNTER — Ambulatory Visit: Admitting: Dermatology

## 2024-03-21 NOTE — Progress Notes (Deleted)
 MRN : [id number]  Darlene Hubbard is a 73 y.o. (Mar 30, 1951) female who presents with chief complaint of legs hurt and swell.  History of Present Illness:   The patient presents today for follow-up evaluation of her varicose veins.  Patient has had some swelling of her lower extremities.  She has previously had a DVT in the past.  The patient has also had numerous endovenous laser ablations on both great saphenous veins as well as a left accessory saphenous veins.  She continues to have varicosities.  She continues to use conservative therapy.  The patient is concerned due to her previous numerous issues she wanted to follow-up to ensure there were no worsening issues.   Previous venous duplex there is no evidence of DVT or superficial thrombophlebitis seen bilaterally.  Reflux noted in both the deep and the superficial venous systems bilaterally.  No outpatient medications have been marked as taking for the 03/26/24 encounter (Appointment) with Jama, Cordella MATSU, MD.    Past Medical History:  Diagnosis Date   Actinic keratosis 08/06/2015   L med lower leg (bx proven)   Allergy    Anxiety    Asthma    Basal cell carcinoma 08/28/2015   R ant to sideburn zygoma    Basal cell carcinoma 02/16/2023   R nose, EDC   Bursitis    BOTH SHOULDER AND HIPS   COPD (chronic obstructive pulmonary disease) (HCC)    COVID-19 05/2019   Dysplastic nevus 02/28/2019   R upper back paraspinal - mod   Dysplastic nevus 03/17/2020   L lat mid lat tricep - mod   Fibromyalgia    GERD (gastroesophageal reflux disease)    Hiatal hernia    History of kidney stones    IBS (irritable bowel syndrome)    Mild obstructive sleep apnea    does not use Cpap   Morbid obesity (HCC)    Pneumonia    Spinal stenosis    Varicose veins     Past Surgical History:  Procedure Laterality Date   ABDOMINAL HYSTERECTOMY  1993   APPENDECTOMY  1995   BACK SURGERY     BREAST BIOPSY Left 2000   neg   CHOLECYSTECTOMY      COLONOSCOPY  2008   COLONOSCOPY WITH PROPOFOL  N/A 02/18/2016   Procedure: COLONOSCOPY WITH PROPOFOL ;  Surgeon: Reyes LELON Cota, MD;  Location: [facility];  Service: Endoscopy;  Laterality: N/A;   CYSTOSCOPY W/ RETROGRADES Left 05/24/2019   Procedure: CYSTOSCOPY WITH RETROGRADE PYELOGRAM;  Surgeon: Kassie Ozell JONELLE, MD;  Location: [facility];  Service: Urology;  Laterality: Left;   dental work     EXTRACORPOREAL SHOCK WAVE LITHOTRIPSY Right 06/07/2019   Procedure: EXTRACORPOREAL SHOCK WAVE LITHOTRIPSY (ESWL);  Surgeon: Kassie Ozell JONELLE, MD;  Location: [facility];  Service: Urology;  Laterality: Right;   HARDWARE REMOVAL Left 01/19/2021   Procedure: HARDWARE REMOVAL;  Surgeon: Mardee Lynwood SQUIBB, MD;  Location: [facility];  Service: Orthopedics;  Laterality: Left;   ORIF PATELLA Left 06/02/2016   Procedure: OPEN REDUCTION INTERNAL (ORIF) FIXATION PATELLA;  Surgeon: Lynwood SQUIBB Mardee, MD;  Location: [facility];  Service: Orthopedics;  Laterality: Left;   PARTIAL HYSTERECTOMY     PATELLAR TENDON REPAIR Left 06/02/2016   Procedure: PATELLA TENDON REPAIR;  Surgeon: Lynwood SQUIBB Mardee, MD;  Location: [facility];  Service: Orthopedics;  Laterality: Left;   TUBAL LIGATION     UPPER GI ENDOSCOPY  2008   URETEROSCOPY WITH  HOLMIUM LASER LITHOTRIPSY Left 05/24/2019   Procedure: URETEROSCOPY WITH HOLMIUM LASER LITHOTRIPSY;  Surgeon: Kassie Ozell SAUNDERS, MD;  Location: [facility];  Service: Urology;  Laterality: Left;   vein closure procedure Bilateral 2010    Social History Social History   Tobacco Use   Smoking status: Never   Smokeless tobacco: Never  Vaping Use   Vaping status: Never Used  Substance Use Topics   Alcohol use: Not Currently   Drug use: No    Family History Family History  Problem Relation Age of Onset   Thrombocytopenia Mother 71   Cancer Father        colon   Colon cancer Father 26   Cancer Sister        cancer of the blood   Other Sister        Bone Marrow disorder   Breast cancer Neg  Hx     Allergies  Allergen Reactions   Tape Other (See Comments)    Tears skin   Doxycycline Nausea And Vomiting   Molds & Smuts Other (See Comments)    allergic   Cat Dander Itching    sneezing   Diovan [Valsartan] Rash    Edema   Sulfa Antibiotics Itching and Rash   Sulfa Drugs Cross Reactors Itching and Rash     REVIEW OF SYSTEMS (Negative unless checked)  Constitutional: [] Weight loss  [] Fever  [] Chills Cardiac: [] Chest pain   [] Chest pressure   [] Palpitations   [] Shortness of breath when laying flat   [] Shortness of breath with exertion. Vascular:  [] Pain in legs with walking   [x] Pain in legs at rest  [x] History of DVT   [] Phlebitis   [x] Swelling in legs   [x] Varicose veins   [] Non-healing ulcers Pulmonary:   [] Uses home oxygen   [] Productive cough   [] Hemoptysis   [] Wheeze  [x] COPD   [] Asthma Neurologic:  [] Dizziness   [] Seizures   [] History of stroke   [] History of TIA  [] Aphasia   [] Vissual changes   [] Weakness or numbness in arm   [] Weakness or numbness in leg Musculoskeletal:   [] Joint swelling   [] Joint pain   [] Low back pain Hematologic:  [] Easy bruising  [] Easy bleeding   [] Hypercoagulable state   [] Anemic Gastrointestinal:  [] Diarrhea   [] Vomiting  [] Gastroesophageal reflux/heartburn   [] Difficulty swallowing. Genitourinary:  [] Chronic kidney disease   [] Difficult urination  [] Frequent urination   [] Blood in urine Skin:  [] Rashes   [] Ulcers  Psychological:  [] History of anxiety   []  History of major depression.  Physical Examination  There were no vitals filed for this visit. There is no height or weight on file to calculate BMI. Gen: WD/WN, NAD Head: /AT, No temporalis wasting.  Ear/Nose/Throat: Hearing grossly intact, nares w/o erythema or drainage, pinna without lesions Eyes: PER, EOMI, sclera nonicteric.  Neck: Supple, no gross masses.  No JVD.  Pulmonary:  Good air movement, no audible wheezing, no use of accessory muscles.  Cardiac: RRR, precordium  not hyperdynamic. Vascular:  scattered varicosities present bilaterally.  Moderate venous stasis changes to the legs bilaterally.  2+ soft pitting edema. CEAP [id number]   Vessel Right Left  Radial Palpable Palpable  Gastrointestinal: soft, non-distended. No guarding/no peritoneal signs.  Musculoskeletal: M/S 5/5 throughout.  No deformity.  Neurologic: CN 2-12 intact. Pain and light touch intact in extremities.  Symmetrical.  Speech is fluent. Motor exam as listed above. Psychiatric: Judgment intact, Mood & affect appropriate for pt's clinical situation. Dermatologic: Venous rashes no ulcers  noted.  No changes consistent with cellulitis. Lymph : No lichenification or skin changes of chronic lymphedema.  CBC Lab Results  Component Value Date   WBC 11.7 (H) 11/17/2020   HGB 14.1 11/17/2020   HCT 42.4 11/17/2020   MCV 89.6 11/17/2020   PLT 266 11/17/2020    BMET    Component Value Date/Time   NA 142 03/25/2021 1158   NA 139 04/26/2013 1716   K 4.3 03/25/2021 1158   K 3.4 (L) 04/26/2013 1716   CL 104 03/25/2021 1158   CL 104 04/26/2013 1716   CO2 27 03/25/2021 1158   CO2 30 04/26/2013 1716   GLUCOSE 92 03/25/2021 1158   GLUCOSE 100 (H) 11/17/2020 2039   GLUCOSE 97 04/26/2013 1716   BUN 14 03/25/2021 1158   BUN 18 04/26/2013 1716   CREATININE 0.70 01/29/2022 1619   CREATININE 0.85 04/26/2013 1716   CALCIUM 9.2 03/25/2021 1158   CALCIUM 8.8 04/26/2013 1716   GFRNONAA >60 11/17/2020 2039   GFRNONAA >60 04/26/2013 1716   GFRAA >60 05/10/2019 2019   GFRAA >60 04/26/2013 1716   CrCl cannot be calculated (Patient's most recent lab result is older than the maximum 21 days allowed.).  COAG No results found for: INR, PROTIME  Radiology DG Knee Complete 4 Views Right Result Date: 03/15/2024 CLINICAL DATA:  Fall, knee pain EXAM: RIGHT KNEE - COMPLETE 4+ VIEW COMPARISON:  None Available. FINDINGS: Chondrocalcinosis. Mild degenerative changes in the right knee with joint space  narrowing and spurring. No joint effusion. No acute bony abnormality. Specifically, no fracture, subluxation, or dislocation. IMPRESSION: Mild degenerative changes with chondrocalcinosis. No acute bony abnormality. Electronically Signed   By: Franky Crease M.D.   On: 03/15/2024 15:11   DG Knee Complete 4 Views Left Result Date: 03/15/2024 CLINICAL DATA:  Fall, knee pain EXAM: LEFT KNEE - COMPLETE 4+ VIEW COMPARISON:  None Available. FINDINGS: Chondrocalcinosis. Moderate degenerative changes in the left knee, most pronounced in the patellofemoral compartment. No joint effusion. No acute bony abnormality. Specifically, no fracture, subluxation, or dislocation. IMPRESSION: Moderate degenerative changes in the left knee with chondrocalcinosis. No acute bony abnormality. Electronically Signed   By: Franky Crease M.D.   On: 03/15/2024 15:10     Assessment/Plan There are no diagnoses linked to this encounter.   Cordella Shawl, MD  03/21/2024 7:57 AM

## 2024-03-22 ENCOUNTER — Ambulatory Visit (INDEPENDENT_AMBULATORY_CARE_PROVIDER_SITE_OTHER): Payer: Medicare PPO | Admitting: Vascular Surgery

## 2024-03-23 ENCOUNTER — Ambulatory Visit: Admission: RE | Admit: 2024-03-23 | Source: Home / Self Care | Admitting: Gastroenterology

## 2024-03-23 SURGERY — COLONOSCOPY
Anesthesia: General

## 2024-03-26 ENCOUNTER — Ambulatory Visit (INDEPENDENT_AMBULATORY_CARE_PROVIDER_SITE_OTHER): Admitting: Vascular Surgery

## 2024-03-26 DIAGNOSIS — I872 Venous insufficiency (chronic) (peripheral): Secondary | ICD-10-CM

## 2024-03-26 DIAGNOSIS — Z86718 Personal history of other venous thrombosis and embolism: Secondary | ICD-10-CM

## 2024-03-26 DIAGNOSIS — E782 Mixed hyperlipidemia: Secondary | ICD-10-CM

## 2024-03-26 DIAGNOSIS — J449 Chronic obstructive pulmonary disease, unspecified: Secondary | ICD-10-CM

## 2024-04-03 ENCOUNTER — Ambulatory Visit
Admission: RE | Admit: 2024-04-03 | Discharge: 2024-04-03 | Disposition: A | Discharge status: Home / Self Care | Source: Ambulatory Visit | Attending: Internal Medicine | Admitting: Internal Medicine

## 2024-04-03 DIAGNOSIS — Z1231 Encounter for screening mammogram for malignant neoplasm of breast: Secondary | ICD-10-CM | POA: Insufficient documentation

## 2024-05-10 ENCOUNTER — Ambulatory Visit
Admission: RE | Admit: 2024-05-10 | Discharge: 2024-05-10 | Disposition: A | Discharge status: Home / Self Care | Attending: Gastroenterology | Admitting: Gastroenterology

## 2024-05-10 ENCOUNTER — Encounter: Payer: Self-pay | Admitting: Gastroenterology

## 2024-05-10 ENCOUNTER — Ambulatory Visit

## 2024-05-10 ENCOUNTER — Other Ambulatory Visit: Payer: Self-pay

## 2024-05-10 ENCOUNTER — Encounter
Admission: RE | Disposition: A | Discharge status: Home / Self Care | Payer: Self-pay | Source: Home / Self Care | Attending: Gastroenterology

## 2024-05-10 DIAGNOSIS — Z90711 Acquired absence of uterus with remaining cervical stump: Secondary | ICD-10-CM | POA: Diagnosis not present

## 2024-05-10 DIAGNOSIS — D124 Benign neoplasm of descending colon: Secondary | ICD-10-CM | POA: Diagnosis not present

## 2024-05-10 DIAGNOSIS — G4733 Obstructive sleep apnea (adult) (pediatric): Secondary | ICD-10-CM | POA: Insufficient documentation

## 2024-05-10 DIAGNOSIS — K641 Second degree hemorrhoids: Secondary | ICD-10-CM | POA: Insufficient documentation

## 2024-05-10 DIAGNOSIS — Z9049 Acquired absence of other specified parts of digestive tract: Secondary | ICD-10-CM | POA: Diagnosis not present

## 2024-05-10 DIAGNOSIS — J4489 Other specified chronic obstructive pulmonary disease: Secondary | ICD-10-CM | POA: Insufficient documentation

## 2024-05-10 DIAGNOSIS — K449 Diaphragmatic hernia without obstruction or gangrene: Secondary | ICD-10-CM | POA: Diagnosis not present

## 2024-05-10 DIAGNOSIS — Z800 Family history of malignant neoplasm of digestive organs: Secondary | ICD-10-CM | POA: Diagnosis not present

## 2024-05-10 DIAGNOSIS — K5730 Diverticulosis of large intestine without perforation or abscess without bleeding: Secondary | ICD-10-CM | POA: Diagnosis not present

## 2024-05-10 DIAGNOSIS — G709 Myoneural disorder, unspecified: Secondary | ICD-10-CM | POA: Insufficient documentation

## 2024-05-10 DIAGNOSIS — K219 Gastro-esophageal reflux disease without esophagitis: Secondary | ICD-10-CM | POA: Diagnosis not present

## 2024-05-10 DIAGNOSIS — K317 Polyp of stomach and duodenum: Secondary | ICD-10-CM | POA: Diagnosis not present

## 2024-05-10 DIAGNOSIS — Z860101 Personal history of adenomatous and serrated colon polyps: Secondary | ICD-10-CM | POA: Diagnosis present

## 2024-05-10 DIAGNOSIS — N289 Disorder of kidney and ureter, unspecified: Secondary | ICD-10-CM | POA: Insufficient documentation

## 2024-05-10 DIAGNOSIS — Z1211 Encounter for screening for malignant neoplasm of colon: Secondary | ICD-10-CM | POA: Insufficient documentation

## 2024-05-10 DIAGNOSIS — R110 Nausea: Secondary | ICD-10-CM | POA: Diagnosis present

## 2024-05-10 HISTORY — PX: COLONOSCOPY: SHX5424

## 2024-05-10 HISTORY — PX: POLYPECTOMY: SHX149

## 2024-05-10 HISTORY — PX: ESOPHAGOGASTRODUODENOSCOPY: SHX5428

## 2024-05-10 SURGERY — COLONOSCOPY
Anesthesia: General

## 2024-05-10 MED ORDER — PROPOFOL 500 MG/50ML IV EMUL
INTRAVENOUS | Status: DC | PRN
  Administered 2024-05-10: 150 ug/kg/min via INTRAVENOUS

## 2024-05-10 MED ORDER — LIDOCAINE HCL (CARDIAC) PF 100 MG/5ML IV SOSY
PREFILLED_SYRINGE | INTRAVENOUS | Status: DC | PRN
  Administered 2024-05-10: 100 mg via INTRAVENOUS

## 2024-05-10 MED ORDER — PROPOFOL 10 MG/ML IV BOLUS
INTRAVENOUS | Status: DC | PRN
  Administered 2024-05-10 (×2): 40 mg via INTRAVENOUS
  Administered 2024-05-10: 100 mg via INTRAVENOUS
  Administered 2024-05-10: 50 mg via INTRAVENOUS

## 2024-05-10 MED ORDER — PROPOFOL 1000 MG/100ML IV EMUL
INTRAVENOUS | Status: AC
  Filled 2024-05-10: qty 100

## 2024-05-10 MED ORDER — PHENYLEPHRINE HCL (PRESSORS) 10 MG/ML IV SOLN
INTRAVENOUS | Status: DC | PRN
  Administered 2024-05-10 (×2): 80 ug via INTRAVENOUS

## 2024-05-10 MED ORDER — LIDOCAINE HCL (PF) 2 % IJ SOLN
INTRAMUSCULAR | Status: AC
  Filled 2024-05-10: qty 5

## 2024-05-10 MED ORDER — SIMETHICONE 40 MG/0.6ML PO SUSP
ORAL | Status: DC | PRN
  Administered 2024-05-10: 60 mL

## 2024-05-10 MED ORDER — SODIUM CHLORIDE 0.9 % IV SOLN: INTRAVENOUS | Status: DC

## 2024-05-10 NOTE — Op Note (Signed)
 [facility] Patient Name: Darlene Hubbard Procedure Date: 05/10/2024 9:32 AM MRN: [id number] Account #: [id number] Date of Birth: 09-15-1950 Admit Type: Outpatient Age: 73 Room: [facility] ENDO ROOM 1 Gender: Female Note Status: Finalized Instrument Name: Upper GI Scope [id number] Procedure:             Upper GI endoscopy Indications:           Nausea Providers:             Elspeth Ozell Jungling DO, DO Referring MD:          Oneil PHEBE Pinal, MD (Referring MD) Medicines:             Monitored Anesthesia Care Complications:         No immediate complications. Estimated blood loss:                         Minimal. Procedure:             Pre-Anesthesia Assessment:                        - Prior to the procedure, a History and Physical was                         performed, and patient medications and allergies were                         reviewed. The patient is competent. The risks and                         benefits of the procedure and the sedation options and                         risks were discussed with the patient. All questions                         were answered and informed consent was obtained.                         Patient identification and proposed procedure were                         verified by the physician, the nurse, the anesthetist                         and the technician in the endoscopy suite. Mental                         Status Examination: alert and oriented. Airway                         Examination: normal oropharyngeal airway and neck                         mobility. Respiratory Examination: clear to                         auscultation. CV Examination: RRR, no murmurs, no S3  or S4. Prophylactic Antibiotics: The patient does not                         require prophylactic antibiotics. Prior                         Anticoagulants: The patient has taken no anticoagulant                         or  antiplatelet agents. ASA Grade Assessment: III - A                         patient with severe systemic disease. After reviewing                         the risks and benefits, the patient was deemed in                         satisfactory condition to undergo the procedure. The                         anesthesia plan was to use monitored anesthesia care                         (MAC). Immediately prior to administration of                         medications, the patient was re-assessed for adequacy                         to receive sedatives. The heart rate, respiratory                         rate, oxygen saturations, blood pressure, adequacy of                         pulmonary ventilation, and response to care were                         monitored throughout the procedure. The physical                         status of the patient was re-assessed after the                         procedure.                        After obtaining informed consent, the endoscope was                         passed under direct vision. Throughout the procedure,                         the patient's blood pressure, pulse, and oxygen                         saturations were monitored continuously. The Endoscope  was introduced through the mouth, and advanced to the                         second part of duodenum. The upper GI endoscopy was                         accomplished without difficulty. The patient tolerated                         the procedure well. Findings:      The duodenal bulb, first portion of the duodenum and second portion of       the duodenum were normal. Biopsies for histology were taken with a cold       forceps for evaluation of celiac disease. Estimated blood loss was       minimal.      The entire examined stomach was normal. Biopsies were taken with a cold       forceps for Helicobacter pylori testing. Estimated blood loss was       minimal.      Multiple  less than 5 mm sessile polyps with no bleeding and no stigmata       of recent bleeding were found in the gastric body. Consistent with       previously diagnosed fundic gland polyps Estimated blood loss: none.      A 4 cm hiatal hernia was present. Estimated blood loss: none.      The Z-line was regular. Estimated blood loss: none.      Esophagogastric landmarks were identified: the gastroesophageal junction       was found at 30 cm from the incisors.      The exam of the esophagus was otherwise normal. Impression:            - Normal duodenal bulb, first portion of the duodenum                         and second portion of the duodenum. Biopsied.                        - Normal stomach. Biopsied.                        - Multiple gastric polyps.                        - 4 cm hiatal hernia.                        - Z-line regular.                        - Esophagogastric landmarks identified. Recommendation:        - Patient has a contact number available for                         emergencies. The signs and symptoms of potential                         delayed complications were discussed with the patient.  Return to normal activities tomorrow. Written                         discharge instructions were provided to the patient.                        - Discharge patient to home.                        - Resume previous diet.                        - Continue present medications.                        - Await pathology results.                        - Return to GI clinic as previously scheduled.                        - The findings and recommendations were discussed with                         the patient. Procedure Code(s):     --- Professional ---                        [phone], Esophagogastroduodenoscopy, flexible,                         transoral; with biopsy, single or multiple Diagnosis Code(s):     --- Professional ---                        K31.7,  Polyp of stomach and duodenum                        K44.9, Diaphragmatic hernia without obstruction or                         gangrene CPT copyright 2022 [employer]. All rights reserved. The codes documented in this report are preliminary and upon coder review may  be revised to meet current compliance requirements. Attending Participation:      I personally performed the entire procedure. Elspeth Jungling, DO Elspeth Ozell Jungling DO, DO 05/10/2024 9:48:45 AM This report has been signed electronically. Number of Addenda: 0 Note Initiated On: 05/10/2024 9:32 AM Estimated Blood Loss:  Estimated blood loss was minimal.      [facility]

## 2024-05-10 NOTE — H&P (Signed)
 Pre-Procedure H&P   Patient ID: Darlene Hubbard is a 73 y.o. female.  Gastroenterology Provider: Elspeth Ozell Jungling, DO  Referring Provider: Romero Antigua, PA PCP: Cleotilde Oneil FALCON, MD  Date: 05/10/2024  HPI Darlene Hubbard is a 73 y.o. female who presents today for Esophagogastroduodenoscopy and Colonoscopy for Nausea, personal history of colon polyps, family history of colon cancer .  Patient reports nausea and poor appetite.  She has no abdominal pain dysphagia or odynophagia.  Daily bowel movement without melena hematochezia diarrhea or constipation   Last underwent colonoscopy in August 2022 with 1 TVA removed  EGD in 2012 with fundic gland polyps  Hemoglobin 12.4 MCV 90 platelets 273,000 creatinine 0.5  Status post cholecystectomy and hysterectomy   Past Medical History:  Diagnosis Date   Actinic keratosis 08/06/2015   L med lower leg (bx proven)   Allergy    Anxiety    Asthma    Basal cell carcinoma 08/28/2015   R ant to sideburn zygoma    Basal cell carcinoma 02/16/2023   R nose, EDC   Bursitis    BOTH SHOULDER AND HIPS   COPD (chronic obstructive pulmonary disease) (HCC)    COVID-19 05/2019   Dysplastic nevus 02/28/2019   R upper back paraspinal - mod   Dysplastic nevus 03/17/2020   L lat mid lat tricep - mod   Fibromyalgia    GERD (gastroesophageal reflux disease)    Hiatal hernia    History of kidney stones    IBS (irritable bowel syndrome)    Mild obstructive sleep apnea    does not use Cpap   Morbid obesity (HCC)    Pneumonia    Spinal stenosis    Varicose veins     Past Surgical History:  Procedure Laterality Date   ABDOMINAL HYSTERECTOMY  1993   APPENDECTOMY  1995   BACK SURGERY     BREAST BIOPSY Left 2000   neg   CHOLECYSTECTOMY     COLONOSCOPY  2008   COLONOSCOPY WITH PROPOFOL  N/A 02/18/2016   Procedure: COLONOSCOPY WITH PROPOFOL ;  Surgeon: Reyes LELON Cota, MD;  Location: [facility];  Service: Endoscopy;  Laterality: N/A;    CYSTOSCOPY W/ RETROGRADES Left 05/24/2019   Procedure: CYSTOSCOPY WITH RETROGRADE PYELOGRAM;  Surgeon: Kassie Ozell JONELLE, MD;  Location: [facility];  Service: Urology;  Laterality: Left;   dental work     EXTRACORPOREAL SHOCK WAVE LITHOTRIPSY Right 06/07/2019   Procedure: EXTRACORPOREAL SHOCK WAVE LITHOTRIPSY (ESWL);  Surgeon: Kassie Ozell JONELLE, MD;  Location: [facility];  Service: Urology;  Laterality: Right;   HARDWARE REMOVAL Left 01/19/2021   Procedure: HARDWARE REMOVAL;  Surgeon: Mardee Lynwood SQUIBB, MD;  Location: [facility];  Service: Orthopedics;  Laterality: Left;   ORIF PATELLA Left 06/02/2016   Procedure: OPEN REDUCTION INTERNAL (ORIF) FIXATION PATELLA;  Surgeon: Lynwood SQUIBB Mardee, MD;  Location: [facility];  Service: Orthopedics;  Laterality: Left;   PARTIAL HYSTERECTOMY     PATELLAR TENDON REPAIR Left 06/02/2016   Procedure: PATELLA TENDON REPAIR;  Surgeon: Lynwood SQUIBB Mardee, MD;  Location: [facility];  Service: Orthopedics;  Laterality: Left;   TUBAL LIGATION     UPPER GI ENDOSCOPY  2008   URETEROSCOPY WITH HOLMIUM LASER LITHOTRIPSY Left 05/24/2019   Procedure: URETEROSCOPY WITH HOLMIUM LASER LITHOTRIPSY;  Surgeon: Kassie Ozell JONELLE, MD;  Location: [facility];  Service: Urology;  Laterality: Left;   vein closure procedure Bilateral 2010    Family History Father- crc 30s MGM- gastric ca  No other h/o GI disease or malignancy  Review of Systems  Constitutional:  Positive for appetite change. Negative for activity change, chills, diaphoresis, fatigue, fever and unexpected weight change.  HENT:  Negative for trouble swallowing and voice change.   Respiratory:  Negative for shortness of breath and wheezing.   Cardiovascular:  Negative for chest pain, palpitations and leg swelling.  Gastrointestinal:  Positive for nausea. Negative for abdominal distention, abdominal pain, anal bleeding, blood in stool, constipation, diarrhea, rectal pain and vomiting.  Musculoskeletal:  Negative for arthralgias and myalgias.   Skin:  Negative for color change and pallor.  Neurological:  Negative for dizziness, syncope and weakness.  Psychiatric/Behavioral:  Negative for confusion.   All other systems reviewed and are negative.    Medications No current facility-administered medications on file prior to encounter.   Current Outpatient Medications on File Prior to Encounter  Medication Sig Dispense Refill   albuterol (VENTOLIN HFA) 108 (90 Base) MCG/ACT inhaler Inhale into the lungs.     fluticasone-salmeterol (ADVAIR) 250-50 MCG/ACT AEPB Inhale 1 puff into the lungs 2 (two) times daily as needed (respiratory issues.).      montelukast  (SINGULAIR ) 10 MG tablet Take 10 mg by mouth daily.     pantoprazole  (PROTONIX ) 40 MG tablet Take 40 mg by mouth.     venlafaxine XR (EFFEXOR-XR) 75 MG 24 hr capsule Take 75 mg by mouth.     acetaminophen  (TYLENOL ) 500 MG tablet Take by mouth.     azelastine (ASTELIN) 0.1 % nasal spray Place into the nose.     EPINEPHrine  0.3 mg/0.3 mL IJ SOAJ injection Inject 0.3 mg into the muscle as needed for anaphylaxis.     escitalopram (LEXAPRO) 10 MG tablet Take 10 mg by mouth daily.     galantamine (RAZADYNE) 4 MG tablet Take 4 mg by mouth 2 (two) times daily.     levalbuterol (XOPENEX) 1.25 MG/0.5ML nebulizer solution Inhale into the lungs.     propranolol (INDERAL) 60 MG tablet Take 60 mg by mouth daily.      Pertinent medications related to GI and procedure were reviewed by me with the patient prior to the procedure   Current Facility-Administered Medications:    0.9 %  sodium chloride  infusion, , Intravenous, Continuous, Onita Elspeth Sharper, DO, Last Rate: 20 mL/hr at 05/10/24 0916, New Bag at 05/10/24 0916  sodium chloride  20 mL/hr at 05/10/24 9083       Allergies  Allergen Reactions   Tape Other (See Comments)    Tears skin   Doxycycline Nausea And Vomiting   Molds & Smuts Other (See Comments)    allergic   Cat Dander Itching    sneezing   Diovan [Valsartan] Rash     Edema   Sulfa Antibiotics Itching and Rash   Sulfa Drugs Cross Reactors Itching and Rash   Allergies were reviewed by me prior to the procedure  Objective   Body mass index is 28.27 kg/m. Vitals:   05/10/24 0907  BP: 131/78  Pulse: 69  Resp: 18  Temp: (!) 96.9 F (36.1 C)  TempSrc: Temporal  Weight: 72.4 kg  Height: 5' 3 (1.6 m)     Physical Exam Vitals and nursing note reviewed.  Constitutional:      General: She is not in acute distress.    Appearance: Normal appearance. She is not ill-appearing, toxic-appearing or diaphoretic.  HENT:     Head: Normocephalic and atraumatic.     Nose: Nose normal.  Mouth/Throat:     Mouth: Mucous membranes are moist.     Pharynx: Oropharynx is clear.  Eyes:     General: No scleral icterus.    Extraocular Movements: Extraocular movements intact.  Cardiovascular:     Rate and Rhythm: Normal rate and regular rhythm.     Heart sounds: Normal heart sounds. No murmur heard.    No friction rub. No gallop.  Pulmonary:     Effort: Pulmonary effort is normal. No respiratory distress.     Breath sounds: Normal breath sounds. No wheezing, rhonchi or rales.  Abdominal:     General: Bowel sounds are normal. There is no distension.     Palpations: Abdomen is soft.     Tenderness: There is no abdominal tenderness. There is no guarding or rebound.  Musculoskeletal:     Cervical back: Neck supple.     Right lower leg: No edema.     Left lower leg: No edema.  Skin:    General: Skin is warm and dry.     Coloration: Skin is not jaundiced or pale.  Neurological:     General: No focal deficit present.     Mental Status: She is alert and oriented to person, place, and time. Mental status is at baseline.  Psychiatric:        Mood and Affect: Mood normal.        Behavior: Behavior normal.        Thought Content: Thought content normal.        Judgment: Judgment normal.      Assessment:  Ms. EMILYANNE MCGOUGH is a 73 y.o. female  who  presents today for Esophagogastroduodenoscopy and Colonoscopy for Nausea, personal history of colon polyps, family history of colon cancer .  Plan:  Esophagogastroduodenoscopy and Colonoscopy with possible intervention today  Esophagogastroduodenoscopy and Colonoscopy with possible biopsy, control of bleeding, polypectomy, and interventions as necessary has been discussed with the patient/patient representative. Informed consent was obtained from the patient/patient representative after explaining the indication, nature, and risks of the procedure including but not limited to death, bleeding, perforation, missed neoplasm/lesions, cardiorespiratory compromise, and reaction to medications. Opportunity for questions was given and appropriate answers were provided. Patient/patient representative has verbalized understanding is amenable to undergoing the procedure.   Elspeth Ozell Jungling, DO  [facility]  Portions of the record may have been created with voice recognition software. Occasional wrong-word or 'sound-a-like' substitutions may have occurred due to the inherent limitations of voice recognition software.  Read the chart carefully and recognize, using context, where substitutions may have occurred.

## 2024-05-10 NOTE — Op Note (Signed)
 [facility]astroenter[facility] Patient Name: Darlene Hubbard Procedure Date: 05/10/2024 9:31 AM MRN: [id number] Account #: [id number] Date of Birth: Dec 10, 1950 Admit Type: Outpatient Age: 73 Room: [facility] ENDO ROOM 1 Gender: Female Note Status: Finalized Instrument Name: Colon Scope [id number] Procedure:             Colonoscopy Indications:           High risk colon cancer surveillance: Personal history                         of adenoma with villous component, Family history of                         colon cancer in a first-degree relative before age 66                         years Providers:             Elspeth Ozell Onita ROSALEA, DO Referring MD:          Oneil PHEBE Pinal, MD (Referring MD) Medicines:             Monitored Anesthesia Care Complications:         No immediate complications. Estimated blood loss:                         Minimal. Procedure:             Pre-Anesthesia Assessment:                        - Prior to the procedure, a History and Physical was                         performed, and patient medications and allergies were                         reviewed. The patient is competent. The risks and                         benefits of the procedure and the sedation options and                         risks were discussed with the patient. All questions                         were answered and informed consent was obtained.                         Patient identification and proposed procedure were                         verified by the physician, the nurse, the anesthetist                         and the technician in the endoscopy suite. Mental                         Status Examination: alert and oriented. Airway  Examination: normal oropharyngeal airway and neck                         mobility. Respiratory Examination: clear to                         auscultation. CV Examination: RRR, no murmurs, no S3                         or S4.  Prophylactic Antibiotics: The patient does not                         require prophylactic antibiotics. Prior                         Anticoagulants: The patient has taken no anticoagulant                         or antiplatelet agents. ASA Grade Assessment: III - A                         patient with severe systemic disease. After reviewing                         the risks and benefits, the patient was deemed in                         satisfactory condition to undergo the procedure. The                         anesthesia plan was to use monitored anesthesia care                         (MAC). Immediately prior to administration of                         medications, the patient was re-assessed for adequacy                         to receive sedatives. The heart rate, respiratory                         rate, oxygen saturations, blood pressure, adequacy of                         pulmonary ventilation, and response to care were                         monitored throughout the procedure. The physical                         status of the patient was re-assessed after the                         procedure.                        After obtaining informed consent, the colonoscope was  passed under direct vision. Throughout the procedure,                         the patient's blood pressure, pulse, and oxygen                         saturations were monitored continuously. The                         Colonoscope was introduced through the anus and                         advanced to the the cecum, identified by appendiceal                         orifice and ileocecal valve. The colonoscopy was                         somewhat difficult due to a redundant colon.                         Successful completion of the procedure was aided by                         straightening and shortening the scope to obtain bowel                         loop reduction, using scope  torsion, applying                         abdominal pressure and lavage. The patient tolerated                         the procedure well. The quality of the bowel                         preparation was evaluated using the BBPS [facility] Bowel                         Preparation Scale) with scores of: Right Colon = 3,                         Transverse Colon = 3 and Left Colon = 3 (entire mucosa                         seen well with no residual staining, small fragments                         of stool or opaque liquid). The total BBPS score                         equals 9. The ileocecal valve, appendiceal orifice,                         and rectum were photographed. Findings:      Hemorrhoids were found on perianal exam.      The digital rectal exam was normal. Pertinent negatives include  normal       sphincter tone.      Two sessile polyps were found in the descending colon and ascending       colon. The polyps were 1 to 2 mm in size. These polyps were removed with       a jumbo cold forceps. Resection and retrieval were complete. Estimated       blood loss was minimal.      A few small-mouthed diverticula were found in the sigmoid colon.       Estimated blood loss: none.      Non-bleeding internal hemorrhoids were found during retroflexion and       during perianal exam. The hemorrhoids were Grade II (internal       hemorrhoids that prolapse but reduce spontaneously). Estimated blood       loss: none.      The exam was otherwise without abnormality on direct and retroflexion       views. Impression:            - Hemorrhoids found on perianal exam.                        - Two 1 to 2 mm polyps in the descending colon and in                         the ascending colon, removed with a jumbo cold                         forceps. Resected and retrieved.                        - Diverticulosis in the sigmoid colon.                        - Non-bleeding internal hemorrhoids.                         - The examination was otherwise normal on direct and                         retroflexion views. Recommendation:        - Patient has a contact number available for                         emergencies. The signs and symptoms of potential                         delayed complications were discussed with the patient.                         Return to normal activities tomorrow. Written                         discharge instructions were provided to the patient.                        - Discharge patient to home.                        - Resume previous diet.                        -  Continue present medications.                        - Await pathology results.                        - Repeat colonoscopy for surveillance based on                         pathology results.                        - Return to referring physician as previously                         scheduled.                        - The findings and recommendations were discussed with                         the patient. Procedure Code(s):     --- Professional ---                        [phone], Colonoscopy, flexible; with biopsy, single or                         multiple Diagnosis Code(s):     --- Professional ---                        Z86.010, Personal history of colonic polyps                        D12.4, Benign neoplasm of descending colon                        D12.2, Benign neoplasm of ascending colon                        K64.1, Second degree hemorrhoids                        Z80.0, Family history of malignant neoplasm of                         digestive organs                        K57.30, Diverticulosis of large intestine without                         perforation or abscess without bleeding CPT copyright 2022 American Medical Association. All rights reserved. The codes documented in this report are preliminary and upon coder review may  be revised to meet current compliance  requirements. Attending Participation:      I personally performed the entire procedure. Elspeth Jungling, DO Elspeth Ozell Jungling DO, DO 05/10/2024 10:12:38 AM This report has been signed electronically. Number of Addenda: 0 Note Initiated On: 05/10/2024 9:31 AM Estimated Blood Loss:  Estimated blood loss was minimal.      [facility]

## 2024-05-10 NOTE — Anesthesia Preprocedure Evaluation (Signed)
 Anesthesia Evaluation  Patient identified by MRN, date of birth, ID band Patient awake    Reviewed: Allergy & Precautions, NPO status , Patient's Chart, lab work & pertinent test results  History of Anesthesia Complications Negative for: history of anesthetic complications  Airway Mallampati: III  TM Distance: <3 FB Neck ROM: full    Dental  (+) Chipped   Pulmonary shortness of breath and with exertion, asthma , sleep apnea , COPD   Pulmonary exam normal        Cardiovascular Exercise Tolerance: Good (-) angina negative cardio ROS Normal cardiovascular exam     Neuro/Psych  Neuromuscular disease  negative psych ROS   GI/Hepatic Neg liver ROS, hiatal hernia,GERD  Controlled,,  Endo/Other  negative endocrine ROS    Renal/GU Renal disease  negative genitourinary   Musculoskeletal   Abdominal   Peds  Hematology negative hematology ROS (+)   Anesthesia Other Findings Past Medical History: 08/06/2015: Actinic keratosis     Comment:  L med lower leg (bx proven) No date: Allergy No date: Anxiety No date: Asthma 08/28/2015: Basal cell carcinoma     Comment:  R ant to sideburn zygoma  02/16/2023: Basal cell carcinoma     Comment:  R nose, EDC No date: Bursitis     Comment:  BOTH SHOULDER AND HIPS No date: COPD (chronic obstructive pulmonary disease) (HCC) 05/2019: COVID-19 02/28/2019: Dysplastic nevus     Comment:  R upper back paraspinal - mod 03/17/2020: Dysplastic nevus     Comment:  L lat mid lat tricep - mod No date: Fibromyalgia No date: GERD (gastroesophageal reflux disease) No date: Hiatal hernia No date: History of kidney stones No date: IBS (irritable bowel syndrome) No date: Mild obstructive sleep apnea     Comment:  does not use Cpap No date: Morbid obesity (HCC) No date: Pneumonia No date: Spinal stenosis No date: Varicose veins  Past Surgical History: 1993: ABDOMINAL HYSTERECTOMY 1995:  APPENDECTOMY No date: BACK SURGERY 2000: BREAST BIOPSY; Left     Comment:  neg No date: CHOLECYSTECTOMY 2008: COLONOSCOPY 02/18/2016: COLONOSCOPY WITH PROPOFOL ; N/A     Comment:  Procedure: COLONOSCOPY WITH PROPOFOL ;  Surgeon: Reyes LELON Cota, MD;  Location: [facility] END[facility];  Service:               Endoscopy;  Laterality: N/A; 05/24/2019: CYSTOSCOPY W/ RETROGRADES; Left     Comment:  Procedure: CYSTOSCOPY WITH RETROGRADE PYELOGRAM;                Surgeon: Kassie Ozell SAUNDERS, MD;  Location: [facility] ORS;                Service: Urology;  Laterality: Left; No date: dental work 06/07/2019: EXTRACORPOREAL SHOCK WAVE LITHOTRIPSY; Right     Comment:  Procedure: EXTRACORPOREAL SHOCK WAVE LITHOTRIPSY (ESWL);              Surgeon: Kassie Ozell SAUNDERS, MD;  Location: [facility] OR[facility];                Service: Urology;  Laterality: Right; 01/19/2021: HARDWARE REMOVAL; Left     Comment:  Procedure: HARDWARE REMOVAL;  Surgeon: Mardee Lynwood SQUIBB,               MD;  Location: [facility] OR[facility];  Service: Orthopedics;                Laterality: Left; 06/02/2016: ORIF PATELLA; Left  Comment:  Procedure: OPEN REDUCTION INTERNAL (ORIF) FIXATION               PATELLA;  Surgeon: Lynwood SHAUNNA Hue, MD;  Location: [facility]               ORS;  Service: Orthopedics;  Laterality: Left; No date: PARTIAL HYSTERECTOMY 06/02/2016: PATELLAR TENDON REPAIR; Left     Comment:  Procedure: PATELLA TENDON REPAIR;  Surgeon: Lynwood SHAUNNA Hue, MD;  Location: [facility] OR[facility];  Service: Orthopedics;                Laterality: Left; No date: TUBAL LIGATION 2008: UPPER GI ENDOSCOPY 05/24/2019: URETEROSCOPY WITH HOLMIUM LASER LITHOTRIPSY; Left     Comment:  Procedure: URETEROSCOPY WITH HOLMIUM LASER LITHOTRIPSY;               Surgeon: Kassie Ozell SAUNDERS, MD;  Location: [facility] OR[facility];                Service: Urology;  Laterality: Left; 2010: vein closure procedure; Bilateral  BMI    Body Mass Index: 28.27 kg/m       Reproductive/Obstetrics negative OB ROS                              Anesthesia Physical Anesthesia Plan  ASA: 3  Anesthesia Plan: General   Post-op Pain Management:    Induction: Intravenous  PONV Risk Score and Plan: Propofol  infusion and TIVA  Airway Management Planned: Natural Airway and Nasal Cannula  Additional Equipment:   Intra-op Plan:   Post-operative Plan:   Informed Consent: I have reviewed the patients History and Physical, chart, labs and discussed the procedure including the risks, benefits and alternatives for the proposed anesthesia with the patient or authorized representative who has indicated his/her understanding and acceptance.     Dental Advisory Given  Plan Discussed with: Anesthesiologist, CRNA and Surgeon  Anesthesia Plan Comments: (Patient consented for risks of anesthesia including but not limited to:  - adverse reactions to medications - risk of airway placement if required - damage to eyes, teeth, lips or other oral mucosa - nerve damage due to positioning  - sore throat or hoarseness - Damage to heart, brain, nerves, lungs, other parts of body or loss of life  Patient voiced understanding and assent.)        Anesthesia Quick Evaluation

## 2024-05-10 NOTE — Transfer of Care (Signed)
 Immediate Anesthesia Transfer of Care Note  Patient: Darlene Hubbard  Procedure(s) Performed: COLONOSCOPY EGD (ESOPHAGOGASTRODUODENOSCOPY) POLYPECTOMY, INTESTINE  Patient Location: Endoscopy Unit  Anesthesia Type:MAC  Level of Consciousness: awake  Airway & Oxygen Therapy: Patient Spontanous Breathing and Patient connected to nasal cannula oxygen  Post-op Assessment: Report given to RN and Post -op Vital signs reviewed and stable  Post vital signs: Reviewed and stable  Last Vitals:  Vitals Value Taken Time  BP 109/51 05/10/24 10:10  Temp 35.9 C 05/10/24 10:10  Pulse 65 05/10/24 10:15  Resp 15 05/10/24 10:15  SpO2 100 % 05/10/24 10:15  Vitals shown include unfiled device data.  Last Pain:  Vitals:   05/10/24 1010  TempSrc: Temporal  PainSc:          Complications: No notable events documented.

## 2024-05-10 NOTE — Interval H&P Note (Signed)
 History and Physical Interval Note: Preprocedure H&P from 05/10/24  was reviewed and there was no interval change after seeing and examining the patient.  Written consent was obtained from the patient after discussion of risks, benefits, and alternatives. Patient has consented to proceed with Esophagogastroduodenoscopy and Colonoscopy with possible intervention   05/10/2024 9:33 AM  Darlene Hubbard  has presented today for surgery, with the diagnosis of Z80.0 (ICD-10-CM) - Family history of colon cancer Z86.0100 (ICD-10-CM) - Personal history of colon polyps, unspecified R11.0 (ICD-10-CM) - Nausea.  The various methods of treatment have been discussed with the patient and family. After consideration of risks, benefits and other options for treatment, the patient has consented to  Procedure(s): COLONOSCOPY (N/A) EGD (ESOPHAGOGASTRODUODENOSCOPY) (N/A) as a surgical intervention.  The patient's history has been reviewed, patient examined, no change in status, stable for surgery.  I have reviewed the patient's chart and labs.  Questions were answered to the patient's satisfaction.     Elspeth Ozell Jungling

## 2024-05-10 NOTE — Anesthesia Postprocedure Evaluation (Signed)
 Anesthesia Post Note  Patient: Darlene Hubbard  Procedure(s) Performed: COLONOSCOPY EGD (ESOPHAGOGASTRODUODENOSCOPY) POLYPECTOMY, INTESTINE  Patient location during evaluation: Endoscopy Anesthesia Type: General Level of consciousness: awake and alert Pain management: pain level controlled Vital Signs Assessment: post-procedure vital signs reviewed and stable Respiratory status: spontaneous breathing, nonlabored ventilation and respiratory function stable Cardiovascular status: blood pressure returned to baseline and stable Postop Assessment: no apparent nausea or vomiting Anesthetic complications: no   No notable events documented.   Last Vitals:  Vitals:   05/10/24 1020 05/10/24 1030  BP: (!) 97/54 121/65  Pulse: 61 75  Resp: 19 13  Temp:    SpO2: 100% 94%    Last Pain:  Vitals:   05/10/24 1030  TempSrc:   PainSc: 0-No pain                 Fairy POUR Elzora Cullins

## 2024-05-11 LAB — SURGICAL PATHOLOGY

## 2024-06-13 ENCOUNTER — Ambulatory Visit: Admitting: Dermatology

## 2024-07-31 ENCOUNTER — Ambulatory Visit: Admitting: Dermatology
